# Patient Record
Sex: Female | Born: 1945 | Race: White | Hispanic: No | Marital: Married | Smoking: Never smoker
Health system: Southern US, Community
[De-identification: ages and names within clinical notes are randomized; demographics above are authoritative.]

## PROBLEM LIST (undated history)

## (undated) DIAGNOSIS — I447 Left bundle-branch block, unspecified: Secondary | ICD-10-CM

## (undated) DIAGNOSIS — E785 Hyperlipidemia, unspecified: Secondary | ICD-10-CM

## (undated) DIAGNOSIS — R740 Nonspecific elevation of levels of transaminase and lactic acid dehydrogenase [LDH]: Secondary | ICD-10-CM

## (undated) DIAGNOSIS — R7401 Elevation of levels of liver transaminase levels: Secondary | ICD-10-CM

## (undated) DIAGNOSIS — Z9889 Other specified postprocedural states: Secondary | ICD-10-CM

## (undated) DIAGNOSIS — I10 Essential (primary) hypertension: Secondary | ICD-10-CM

## (undated) DIAGNOSIS — C8590 Non-Hodgkin lymphoma, unspecified, unspecified site: Secondary | ICD-10-CM

## (undated) DIAGNOSIS — E039 Hypothyroidism, unspecified: Secondary | ICD-10-CM

## (undated) DIAGNOSIS — I5032 Chronic diastolic (congestive) heart failure: Secondary | ICD-10-CM

## (undated) HISTORY — DX: Hypothyroidism, unspecified: E03.9

## (undated) HISTORY — DX: Hyperlipidemia, unspecified: E78.5

## (undated) HISTORY — DX: Elevation of levels of liver transaminase levels: R74.01

## (undated) HISTORY — DX: Nonspecific elevation of levels of transaminase and lactic acid dehydrogenase (ldh): R74.0

## (undated) HISTORY — DX: Left bundle-branch block, unspecified: I44.7

## (undated) HISTORY — DX: Non-Hodgkin lymphoma, unspecified, unspecified site: C85.90

## (undated) HISTORY — DX: Other specified postprocedural states: Z98.890

## (undated) HISTORY — PX: FOOT FRACTURE SURGERY: SHX645

---

## 1981-06-09 HISTORY — PX: ABDOMINAL HYSTERECTOMY: SUR658

## 1997-09-22 ENCOUNTER — Other Ambulatory Visit: Admission: RE | Admit: 1997-09-22 | Discharge: 1997-09-22 | Payer: Self-pay | Admitting: Obstetrics & Gynecology

## 1998-12-19 ENCOUNTER — Encounter: Payer: Self-pay | Admitting: Orthopedic Surgery

## 1998-12-19 ENCOUNTER — Ambulatory Visit (HOSPITAL_COMMUNITY): Admission: RE | Admit: 1998-12-19 | Discharge: 1998-12-19 | Payer: Self-pay | Admitting: Orthopedic Surgery

## 1998-12-28 ENCOUNTER — Inpatient Hospital Stay (HOSPITAL_COMMUNITY): Admission: RE | Admit: 1998-12-28 | Discharge: 1998-12-29 | Payer: Self-pay | Admitting: Orthopedic Surgery

## 1998-12-28 ENCOUNTER — Encounter: Payer: Self-pay | Admitting: Orthopedic Surgery

## 2000-12-17 ENCOUNTER — Ambulatory Visit (HOSPITAL_COMMUNITY): Admission: RE | Admit: 2000-12-17 | Discharge: 2000-12-17 | Payer: Self-pay | Admitting: Oncology

## 2000-12-17 ENCOUNTER — Encounter (HOSPITAL_COMMUNITY): Payer: Self-pay | Admitting: Oncology

## 2001-12-17 ENCOUNTER — Ambulatory Visit (HOSPITAL_COMMUNITY): Admission: RE | Admit: 2001-12-17 | Discharge: 2001-12-17 | Payer: Self-pay | Admitting: Oncology

## 2001-12-17 ENCOUNTER — Encounter (HOSPITAL_COMMUNITY): Payer: Self-pay | Admitting: Oncology

## 2003-01-05 ENCOUNTER — Ambulatory Visit (HOSPITAL_COMMUNITY): Admission: RE | Admit: 2003-01-05 | Discharge: 2003-01-05 | Payer: Self-pay | Admitting: Oncology

## 2003-01-05 ENCOUNTER — Encounter (HOSPITAL_COMMUNITY): Payer: Self-pay | Admitting: Oncology

## 2005-01-02 ENCOUNTER — Ambulatory Visit: Payer: Self-pay | Admitting: Oncology

## 2005-01-03 ENCOUNTER — Ambulatory Visit (HOSPITAL_COMMUNITY): Admission: RE | Admit: 2005-01-03 | Discharge: 2005-01-03 | Payer: Self-pay | Admitting: Oncology

## 2005-11-14 ENCOUNTER — Inpatient Hospital Stay (HOSPITAL_COMMUNITY): Admission: EM | Admit: 2005-11-14 | Discharge: 2005-11-16 | Payer: Self-pay | Admitting: Emergency Medicine

## 2005-12-31 ENCOUNTER — Ambulatory Visit: Payer: Self-pay | Admitting: Oncology

## 2006-01-05 LAB — CBC WITH DIFFERENTIAL/PLATELET
BASO%: 0.4 % (ref 0.0–2.0)
Basophils Absolute: 0 10*3/uL (ref 0.0–0.1)
EOS%: 0.9 % (ref 0.0–7.0)
HGB: 13.4 g/dL (ref 11.6–15.9)
MCH: 30.6 pg (ref 26.0–34.0)
MCHC: 34.4 g/dL (ref 32.0–36.0)
RDW: 13.1 % (ref 11.3–14.5)
WBC: 9.1 10*3/uL (ref 3.9–10.0)
lymph#: 2.6 10*3/uL (ref 0.9–3.3)

## 2006-01-05 LAB — COMPREHENSIVE METABOLIC PANEL
ALT: 48 U/L — ABNORMAL HIGH (ref 0–40)
Albumin: 4.4 g/dL (ref 3.5–5.2)
CO2: 25 mEq/L (ref 19–32)
Glucose, Bld: 86 mg/dL (ref 70–99)
Potassium: 4.4 mEq/L (ref 3.5–5.3)
Sodium: 139 mEq/L (ref 135–145)
Total Protein: 7.9 g/dL (ref 6.0–8.3)

## 2006-01-05 LAB — LACTATE DEHYDROGENASE: LDH: 159 U/L (ref 94–250)

## 2006-01-05 LAB — ERYTHROCYTE SEDIMENTATION RATE: Sed Rate: 25 mm/hr (ref 0–30)

## 2006-12-31 ENCOUNTER — Ambulatory Visit: Payer: Self-pay | Admitting: Oncology

## 2007-01-04 ENCOUNTER — Ambulatory Visit (HOSPITAL_COMMUNITY): Admission: RE | Admit: 2007-01-04 | Discharge: 2007-01-04 | Payer: Self-pay | Admitting: Oncology

## 2007-01-04 LAB — CBC WITH DIFFERENTIAL/PLATELET
BASO%: 0.6 % (ref 0.0–2.0)
EOS%: 2.2 % (ref 0.0–7.0)
LYMPH%: 35.3 % (ref 14.0–48.0)
MCH: 31.5 pg (ref 26.0–34.0)
MCHC: 35.8 g/dL (ref 32.0–36.0)
MONO#: 0.5 10*3/uL (ref 0.1–0.9)
NEUT%: 56.3 % (ref 39.6–76.8)
Platelets: 312 10*3/uL (ref 145–400)
RBC: 4.34 10*6/uL (ref 3.70–5.32)
WBC: 8.8 10*3/uL (ref 3.9–10.0)
lymph#: 3.1 10*3/uL (ref 0.9–3.3)

## 2007-01-04 LAB — COMPREHENSIVE METABOLIC PANEL
ALT: 37 U/L — ABNORMAL HIGH (ref 0–35)
AST: 27 U/L (ref 0–37)
CO2: 23 mEq/L (ref 19–32)
Creatinine, Ser: 0.73 mg/dL (ref 0.40–1.20)
Sodium: 139 mEq/L (ref 135–145)
Total Bilirubin: 0.6 mg/dL (ref 0.3–1.2)
Total Protein: 6.9 g/dL (ref 6.0–8.3)

## 2007-01-04 LAB — LACTATE DEHYDROGENASE: LDH: 141 U/L (ref 94–250)

## 2007-04-14 IMAGING — CR DG CHEST 2V
2 series · 2 of 2 positions shown · non-contrast
Comparison: Report dated 01/05/2003.

CLINICAL DATA: Hodgkin's lymphoma.

CHEST - 2 VIEW

[view not recorded (1 of 2)]
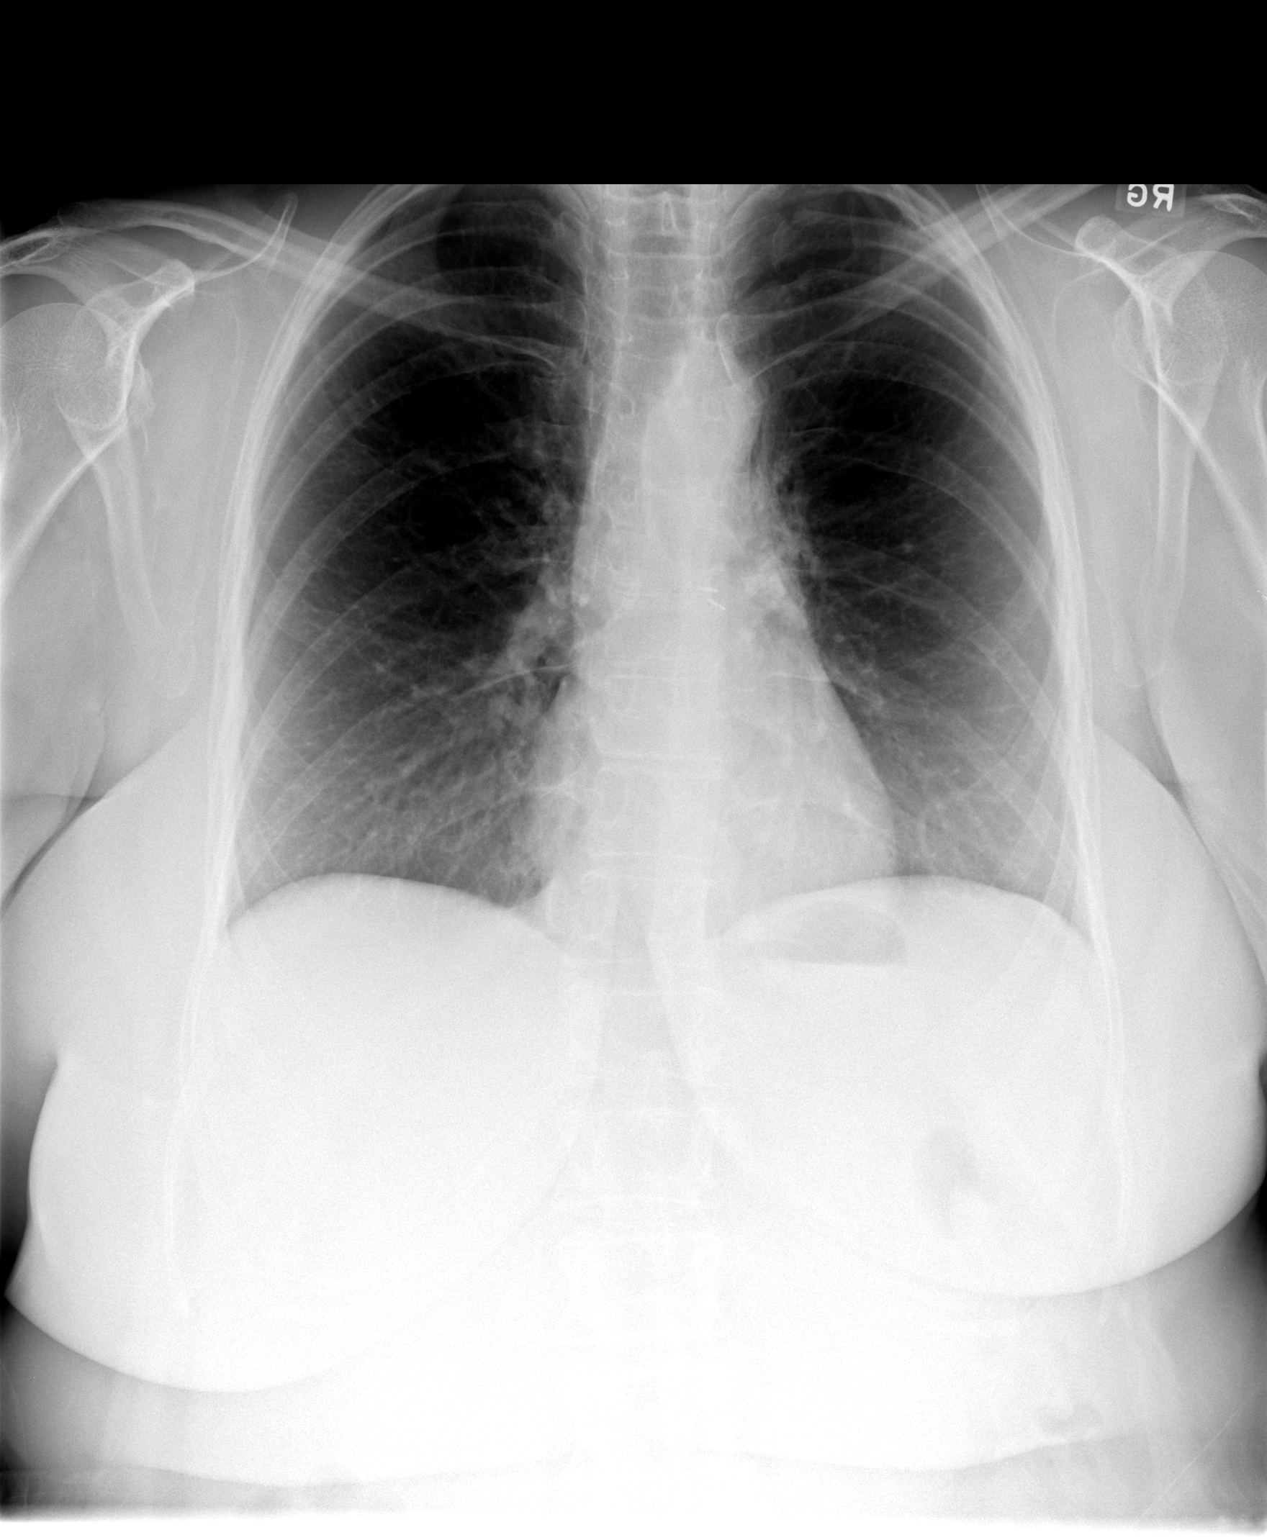

[view not recorded (2 of 2)]
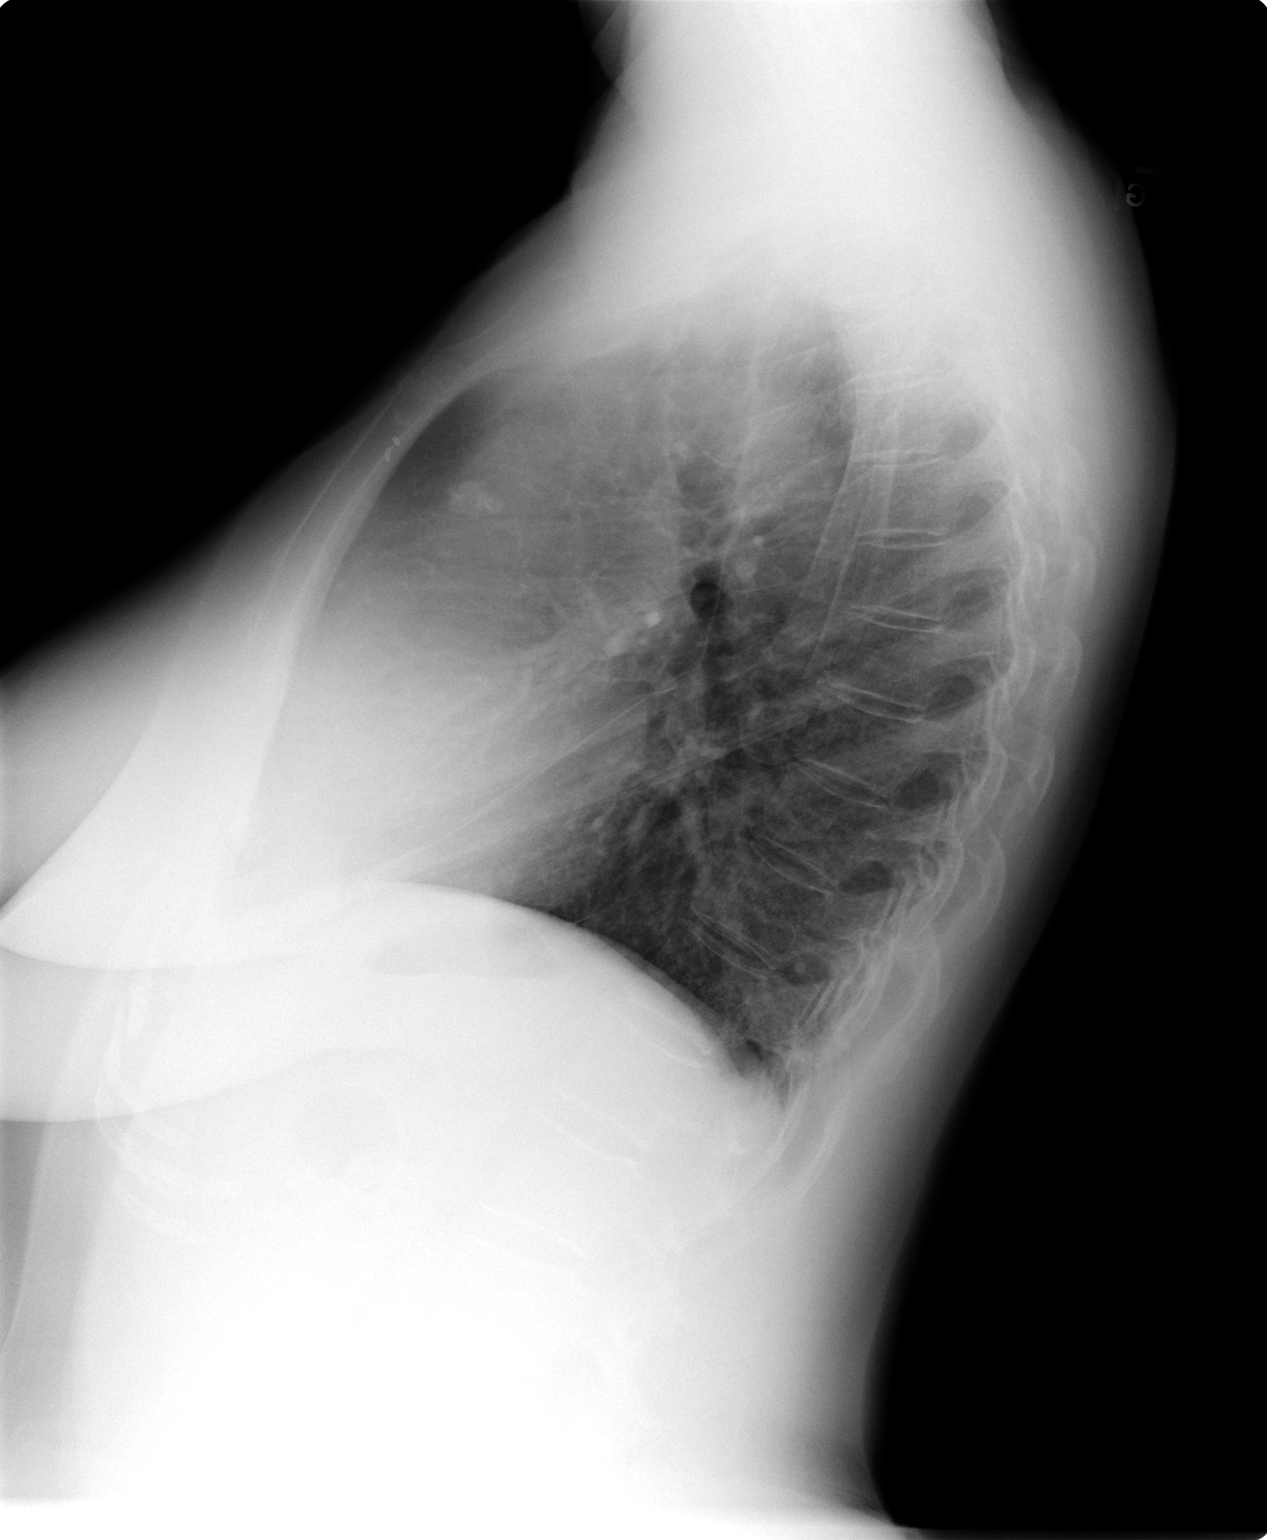

[2 of 2 positions shown; findings below may reference images not displayed]

FINDINGS: Normal sized heart. Minimal diffuse peribronchial thickening and
accentuation of the interstitial markings. Left anterior mediastinal calcified
lymph node and small calcified right hilar lymph nodes. Two surgical clips in
the region of the sternum on the left. Mild scoliosis.

IMPRESSION

1. Minimal chronic bronchitic changes and chronic interstitial lung disease.
2. Previous granulomatous infection.
3. No visibly enlarged lymph nodes.

## 2007-12-31 ENCOUNTER — Ambulatory Visit: Payer: Self-pay | Admitting: Oncology

## 2008-01-04 LAB — CBC WITH DIFFERENTIAL/PLATELET
BASO%: 0.6 % (ref 0.0–2.0)
Basophils Absolute: 0 10*3/uL (ref 0.0–0.1)
Eosinophils Absolute: 0.3 10*3/uL (ref 0.0–0.5)
HCT: 38.4 % (ref 34.8–46.6)
HGB: 13.5 g/dL (ref 11.6–15.9)
MONO#: 0.6 10*3/uL (ref 0.1–0.9)
NEUT#: 3.9 10*3/uL (ref 1.5–6.5)
NEUT%: 49.2 % (ref 39.6–76.8)
WBC: 7.9 10*3/uL (ref 3.9–10.0)
lymph#: 3.1 10*3/uL (ref 0.9–3.3)

## 2008-01-04 LAB — COMPREHENSIVE METABOLIC PANEL
ALT: 39 U/L — ABNORMAL HIGH (ref 0–35)
CO2: 23 mEq/L (ref 19–32)
Calcium: 9.7 mg/dL (ref 8.4–10.5)
Chloride: 106 mEq/L (ref 96–112)
Creatinine, Ser: 0.76 mg/dL (ref 0.40–1.20)
Glucose, Bld: 102 mg/dL — ABNORMAL HIGH (ref 70–99)

## 2008-01-04 LAB — LACTATE DEHYDROGENASE: LDH: 153 U/L (ref 94–250)

## 2008-02-23 IMAGING — CR DG TIBIA/FIBULA 2V*L*
3 series · 3 of 3 positions shown · non-contrast
Comparison: none

CLINICAL DATA: Fell

[view not recorded (1 of 3)]
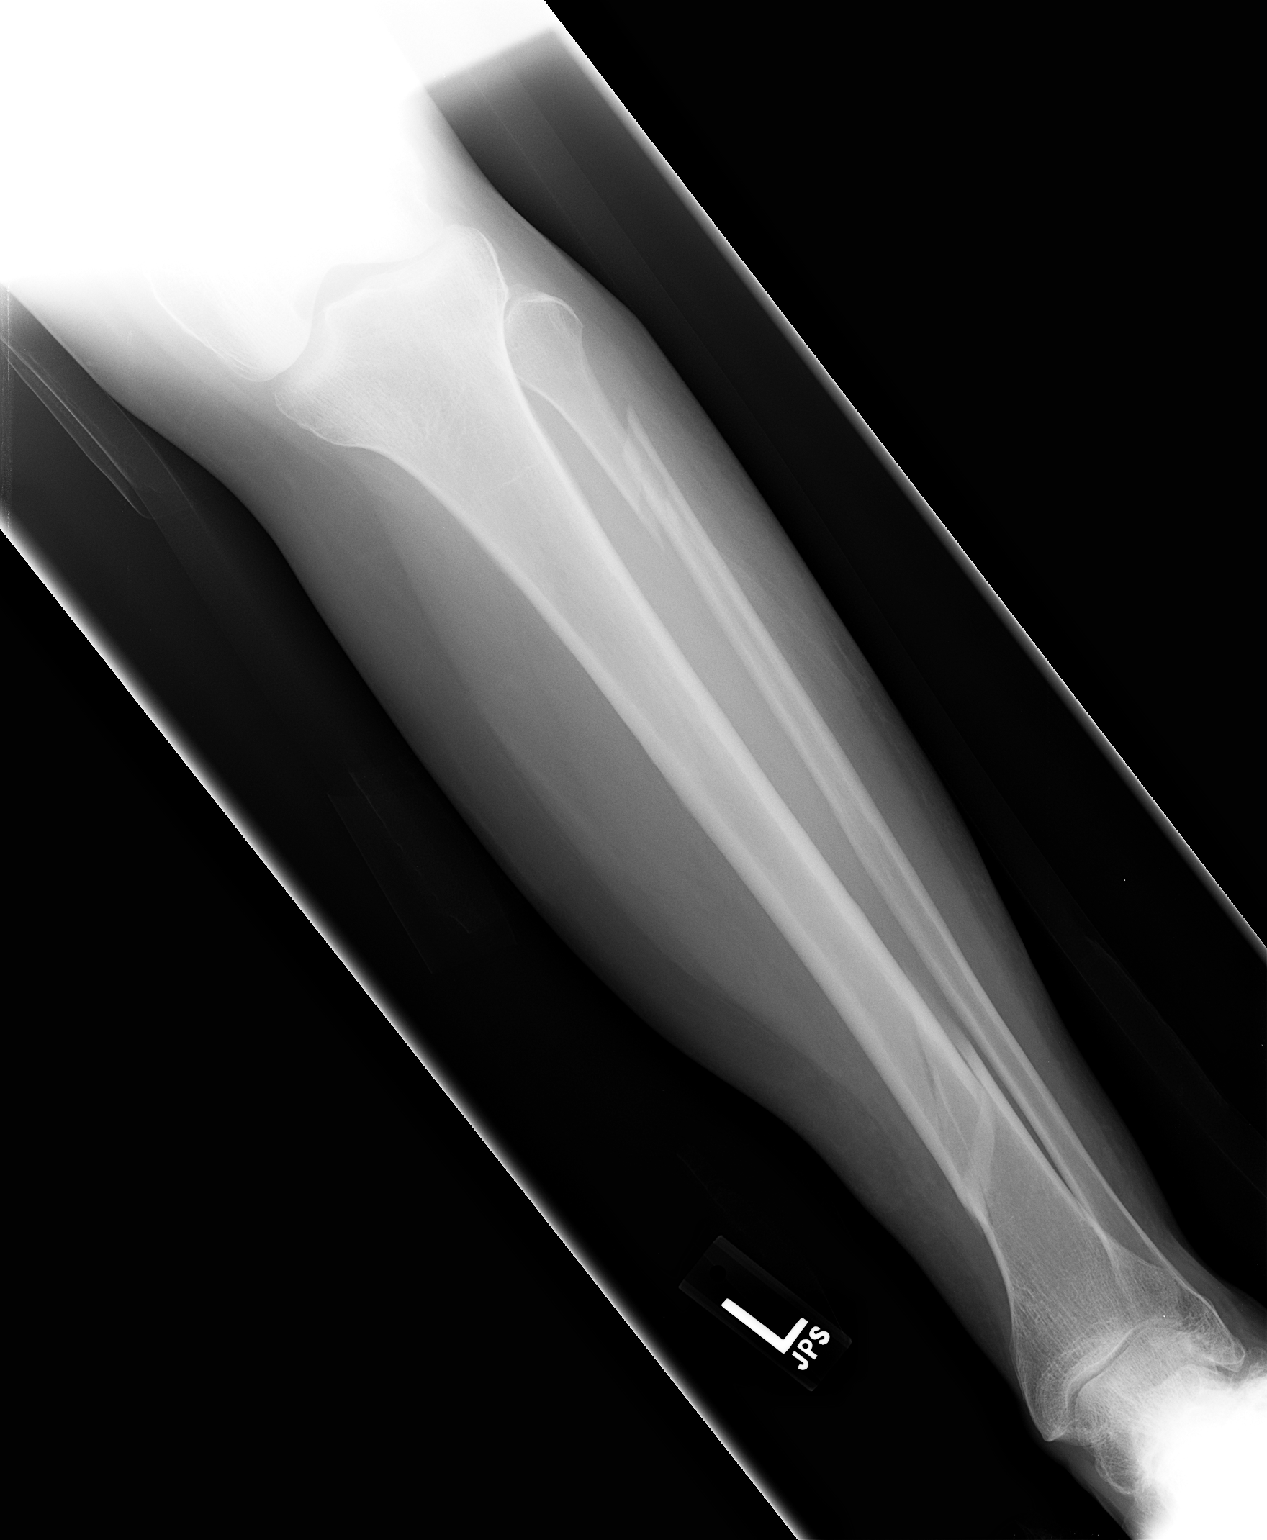

[view not recorded (2 of 3)]
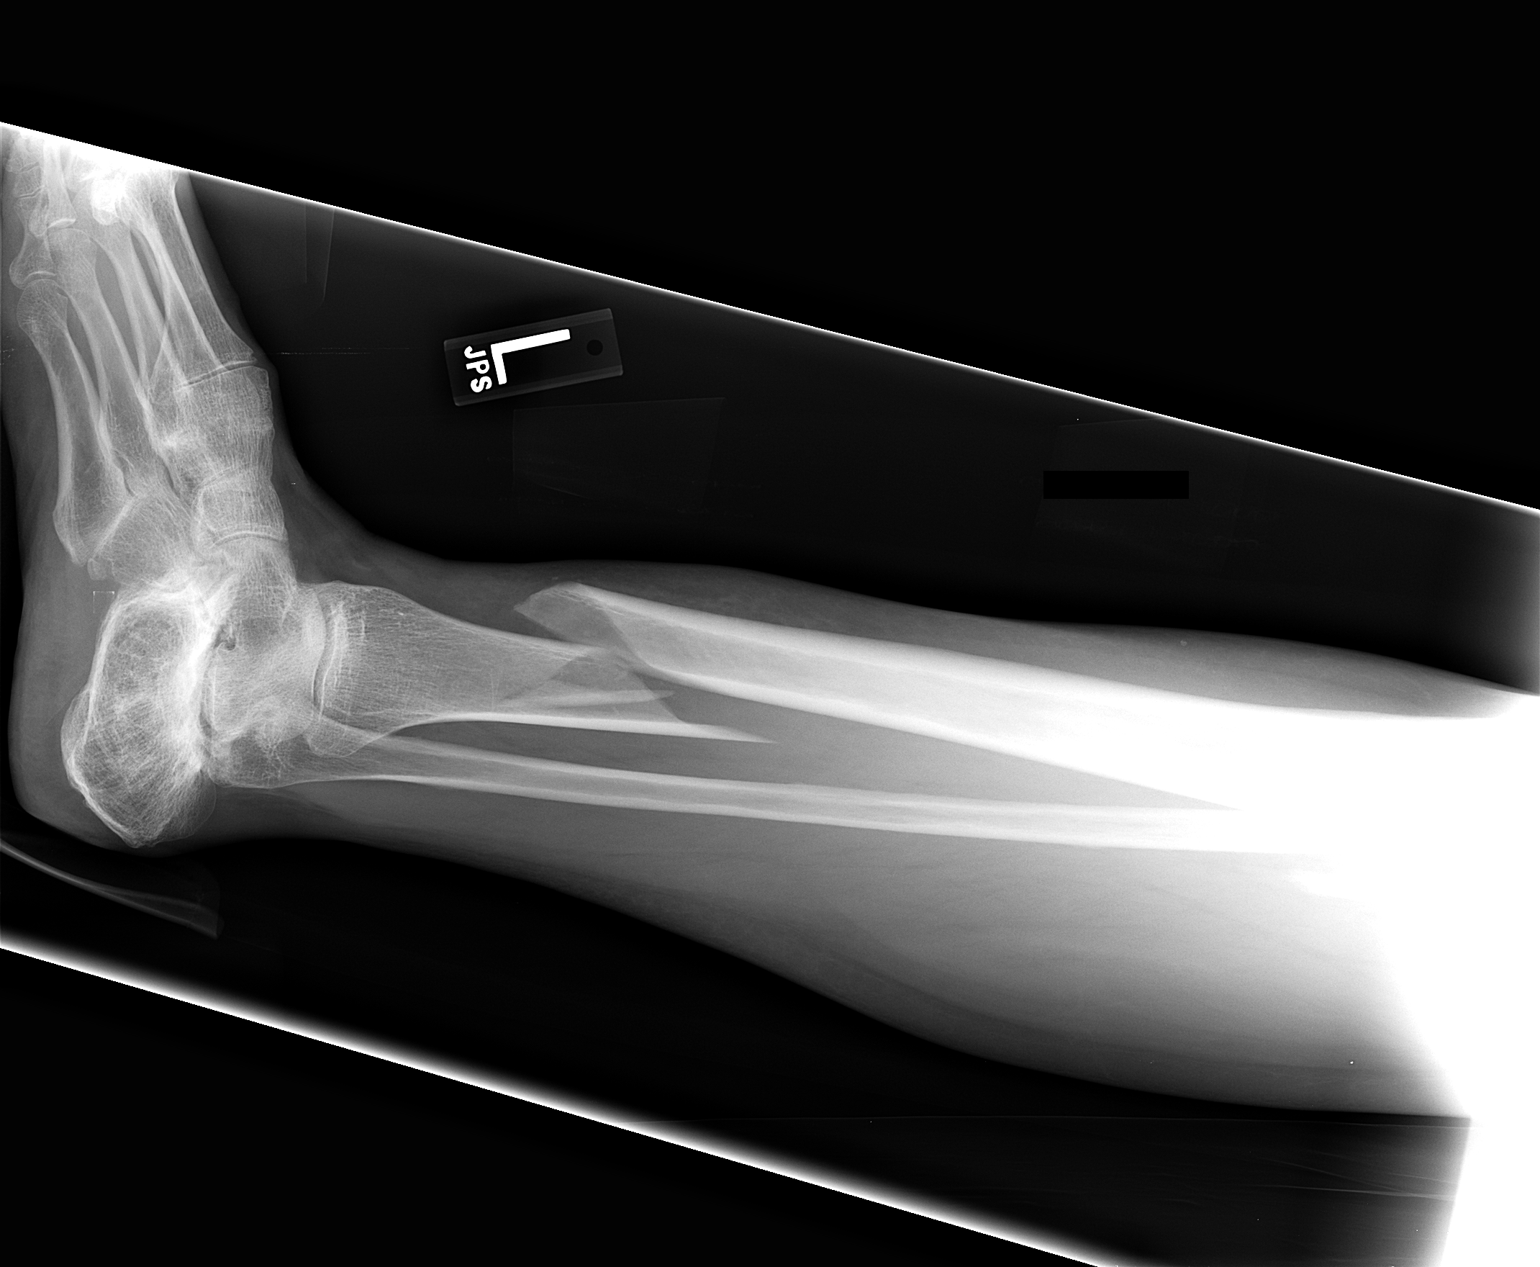

[view not recorded (3 of 3)]
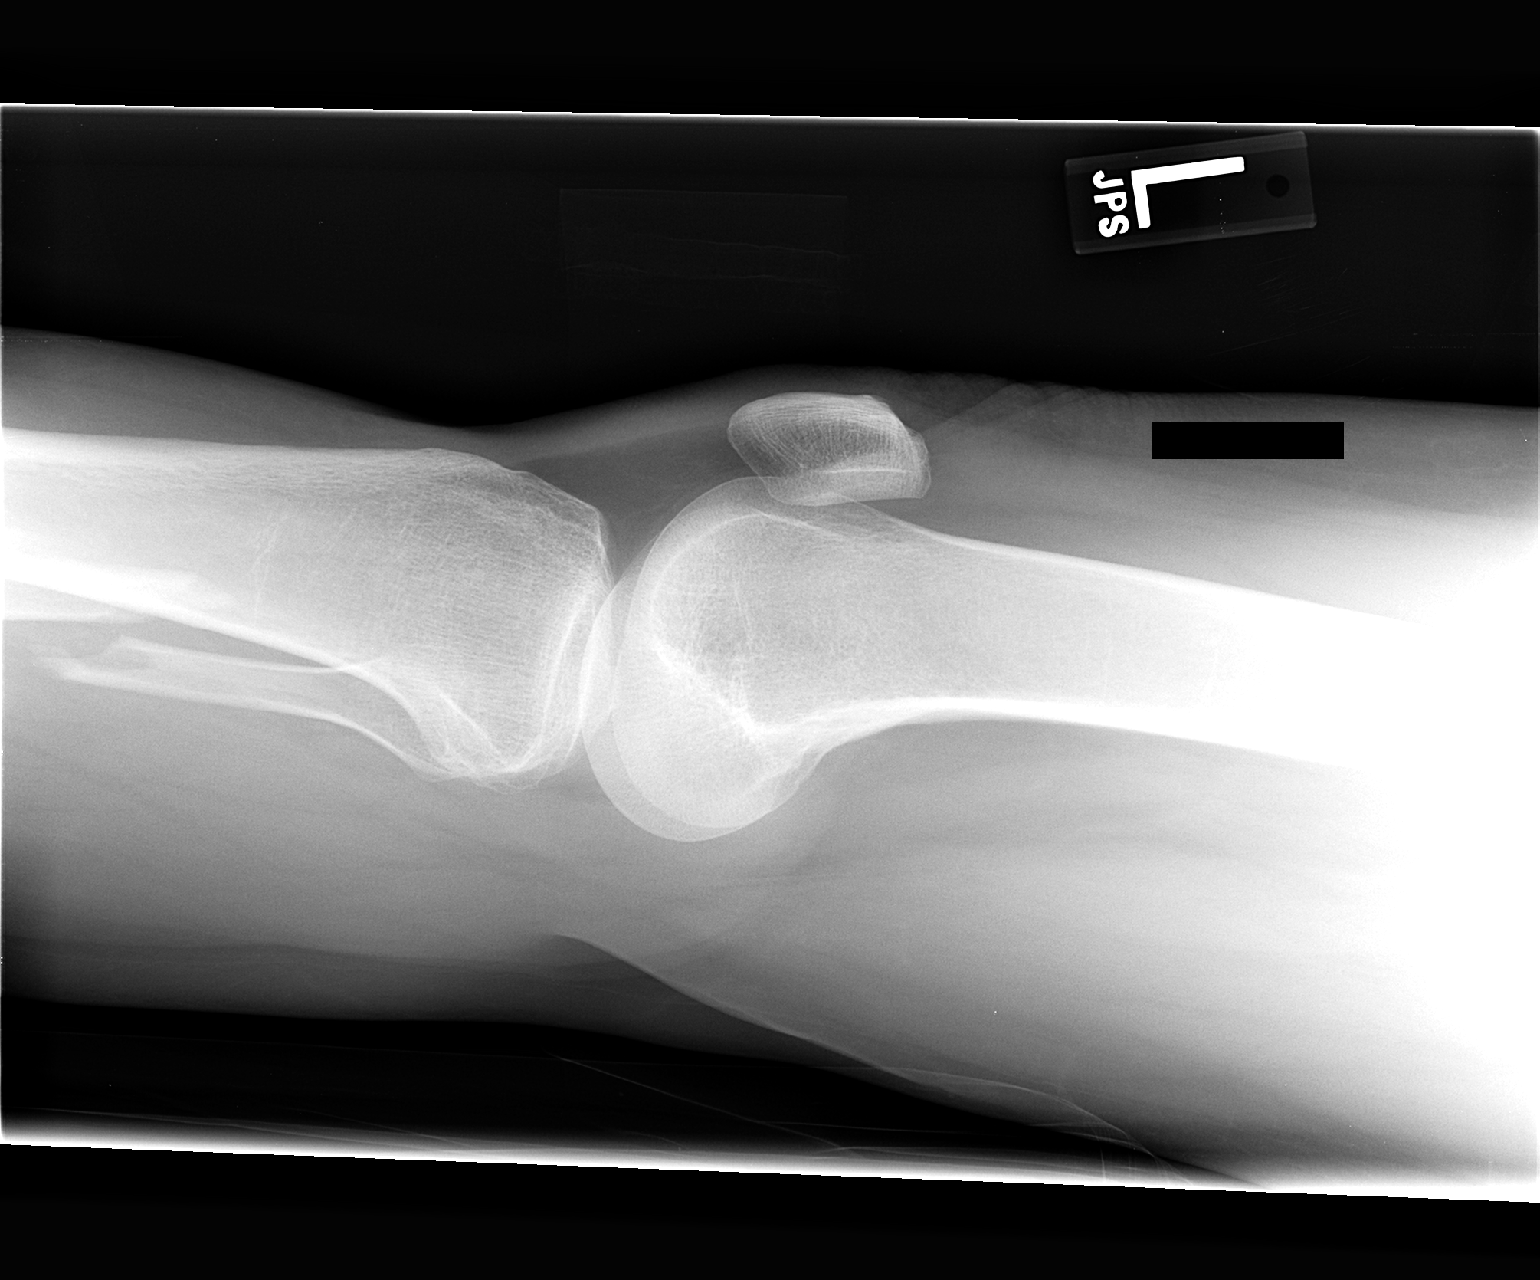

[3 of 3 positions shown; findings below may reference images not displayed]

Left tibia-fibula 2 views:

Oblique fracture of the proximal fibular diaphysis with shaft width lateral and
anterior displacement of this distal shaft fragment. Spiral   fracture of the
distal left tibial diaphysis with shaft width posterior displacement of the
distal fracture fragment. Chronic appearing deformity and sclerosis in the
calcaneus.
IMPRESSION: 1. Displaced proximal fibular and distal tibial shaft fractures.

## 2008-02-23 IMAGING — CR DG CHEST 1V PORT
1 series · 1 of 1 positions shown · non-contrast
Comparison: 01/03/05.
PORTABLE CHEST - 1 VIEW:

CLINICAL DATA: Status post fall.

[view not recorded]
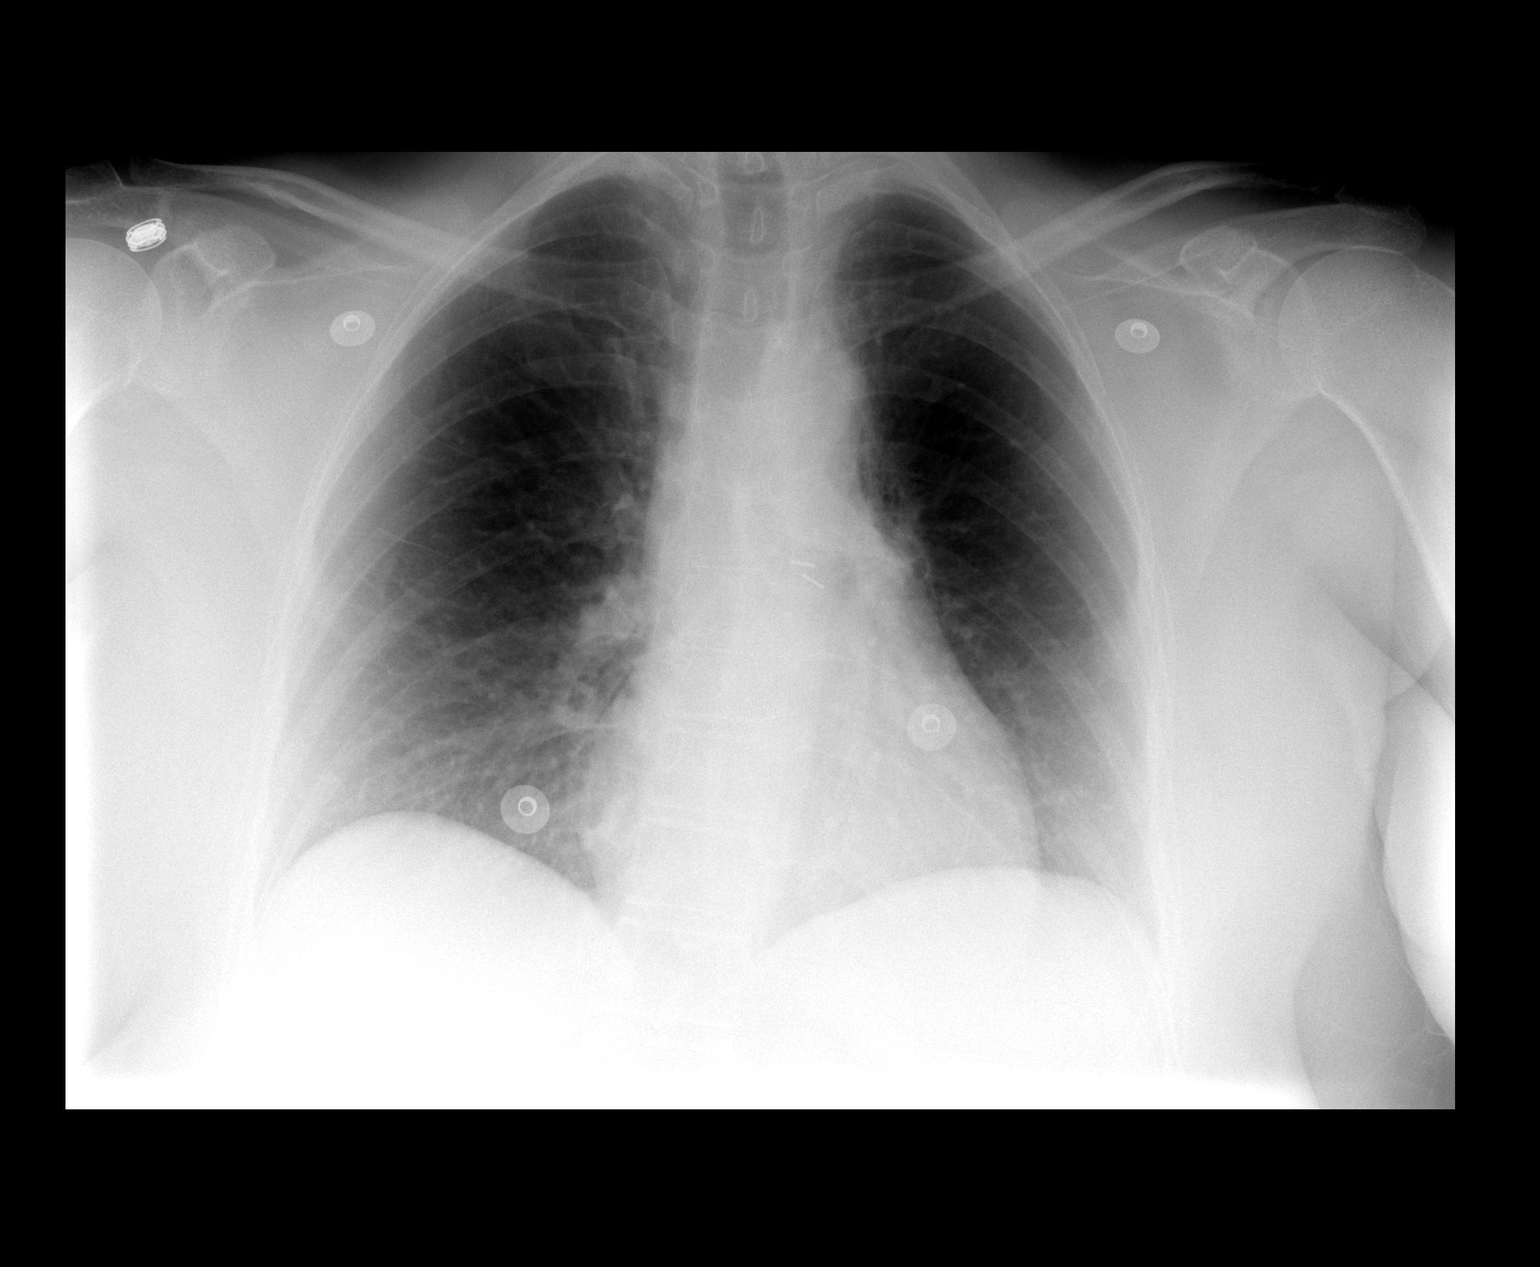

[1 of 1 positions shown; findings below may reference images not displayed]

FINDINGS: Heart size is normal.  There are no effusions or edema.  There are minimal chronic bronchitic changes identified, unchanged from prior exam.  Calcified hilar lymph nodes, consistent with prior granulomatous inflammation.
IMPRESSION: 1.  No active cardiopulmonary disease.  
2.  Chronic bronchitic changes.

## 2008-02-23 IMAGING — CR DG TIBIA/FIBULA PORT 2V*L*
2 series · 2 of 2 positions shown · non-contrast
Comparison: 11/14/05.

CLINICAL DATA: Status post ORIF of tibia fracture.
 LEFT TIBIA AND FIBULA ? 2 VIEWS ? 11/14/05:

[view not recorded (1 of 2)]
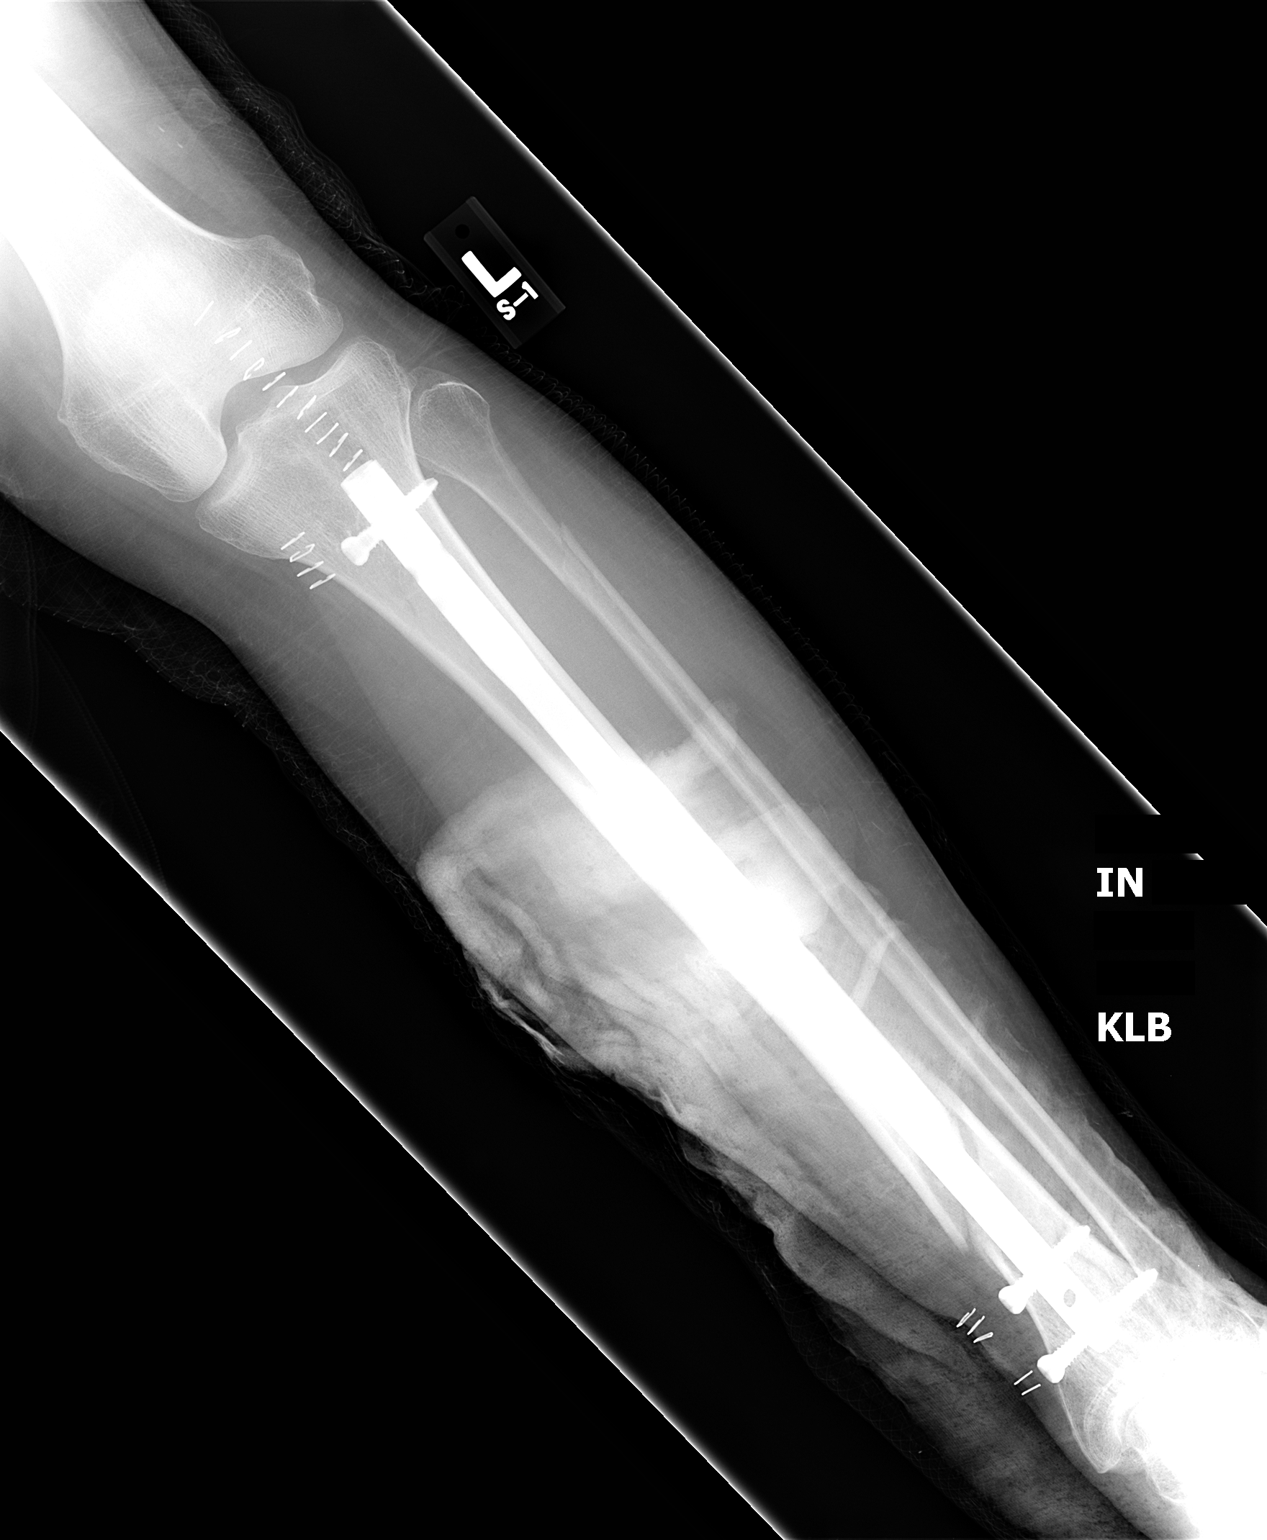

[view not recorded (2 of 2)]
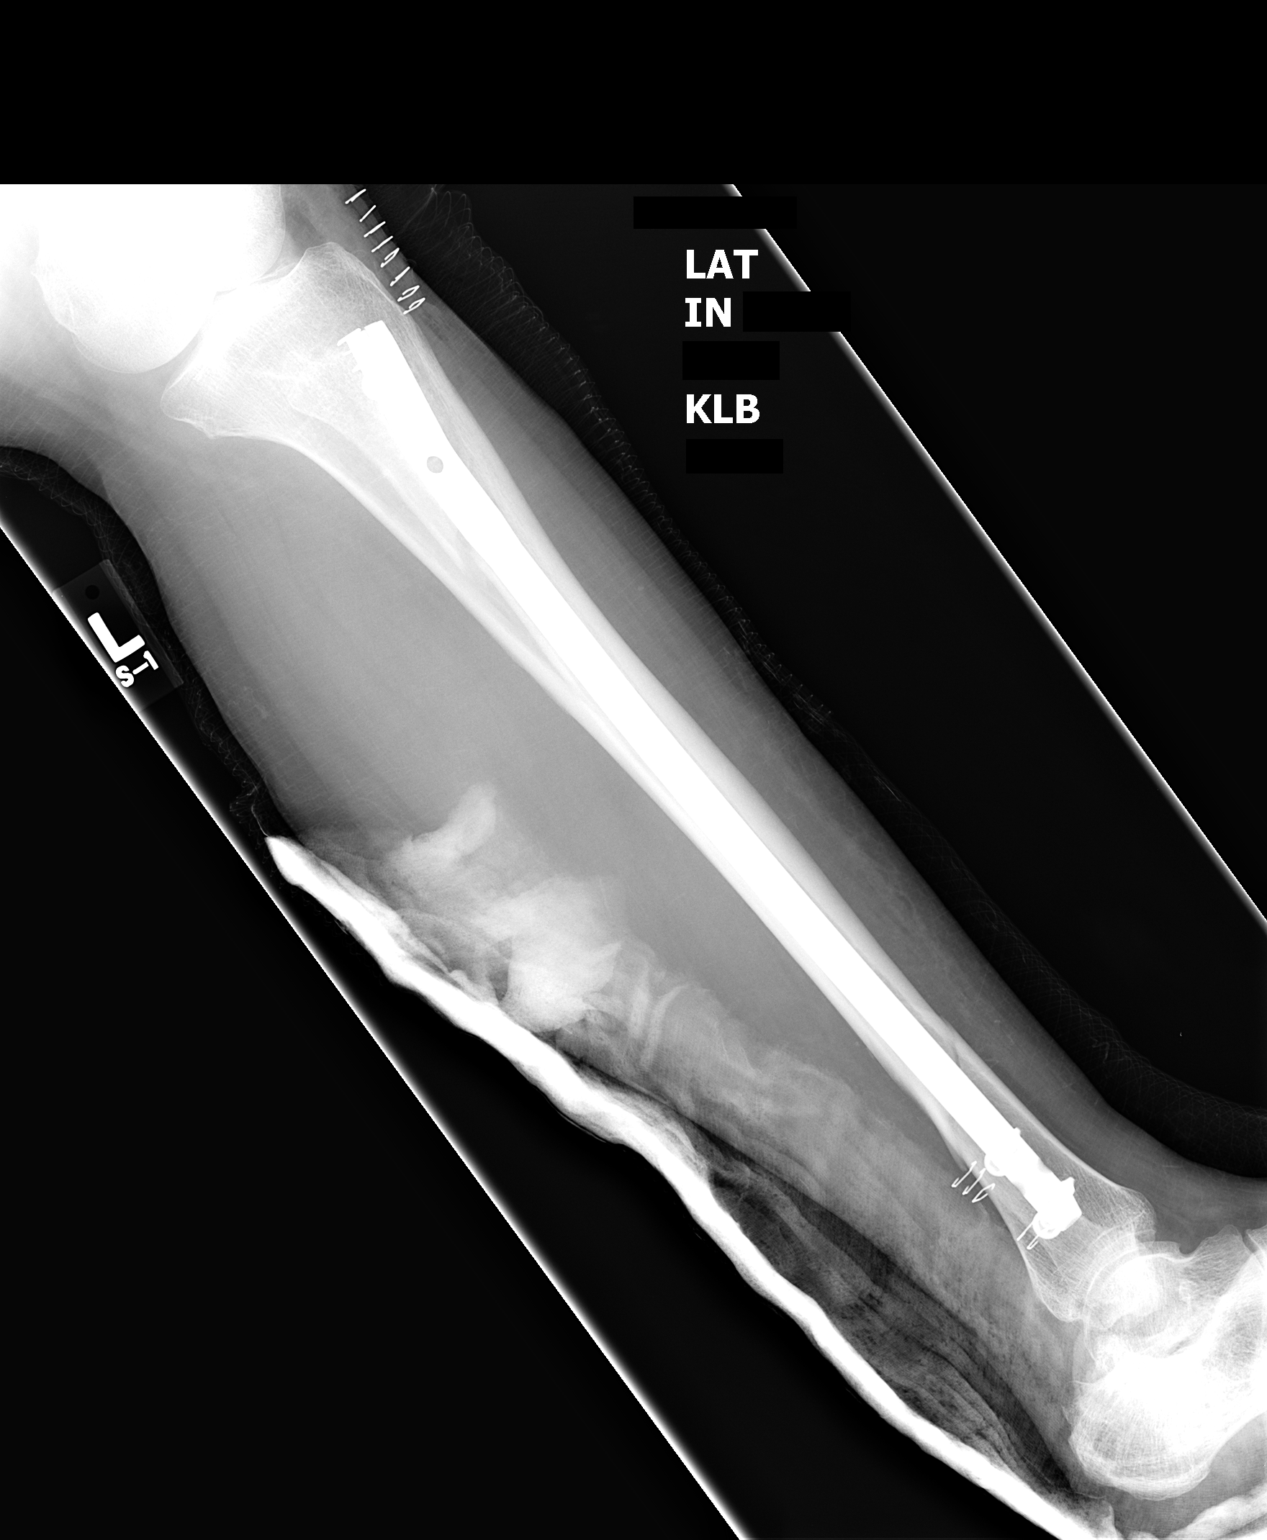

[2 of 2 positions shown; findings below may reference images not displayed]

FINDINGS: A minimally displaced proximal fibular fracture is again noted and has been reduced in the interval.
 An intramedullary rod and screw device reduces a comminuted distal tibial fracture.  The fracture fragments are now in anatomic alignment.
IMPRESSION: Status post ORIF of distal tibial fracture.

## 2008-03-27 ENCOUNTER — Encounter (INDEPENDENT_AMBULATORY_CARE_PROVIDER_SITE_OTHER): Payer: Self-pay | Admitting: *Deleted

## 2008-03-27 ENCOUNTER — Ambulatory Visit (HOSPITAL_COMMUNITY): Admission: RE | Admit: 2008-03-27 | Discharge: 2008-03-27 | Payer: Self-pay | Admitting: *Deleted

## 2009-01-01 ENCOUNTER — Ambulatory Visit: Payer: Self-pay | Admitting: Oncology

## 2009-01-04 LAB — CBC WITH DIFFERENTIAL/PLATELET
EOS%: 2 % (ref 0.0–7.0)
LYMPH%: 33.5 % (ref 14.0–49.7)
MCH: 31 pg (ref 25.1–34.0)
MCV: 90 fL (ref 79.5–101.0)
MONO%: 5.8 % (ref 0.0–14.0)
Platelets: 294 10*3/uL (ref 145–400)
RBC: 4.33 10*6/uL (ref 3.70–5.45)
RDW: 12.6 % (ref 11.2–14.5)

## 2009-01-04 LAB — COMPREHENSIVE METABOLIC PANEL
AST: 34 U/L (ref 0–37)
Albumin: 4.1 g/dL (ref 3.5–5.2)
Alkaline Phosphatase: 84 U/L (ref 39–117)
BUN: 18 mg/dL (ref 6–23)
Potassium: 4.2 mEq/L (ref 3.5–5.3)
Sodium: 139 mEq/L (ref 135–145)
Total Bilirubin: 0.4 mg/dL (ref 0.3–1.2)

## 2009-01-04 LAB — TSH: TSH: 1.716 u[IU]/mL (ref 0.350–4.500)

## 2009-04-14 IMAGING — CR DG CHEST 2V
2 series · 2 of 2 positions shown · non-contrast
Comparison: 11/14/2005

CLINICAL DATA: History of Hodgkin?s disease. 
 CHEST - 2 VIEW:

[w chest pa]
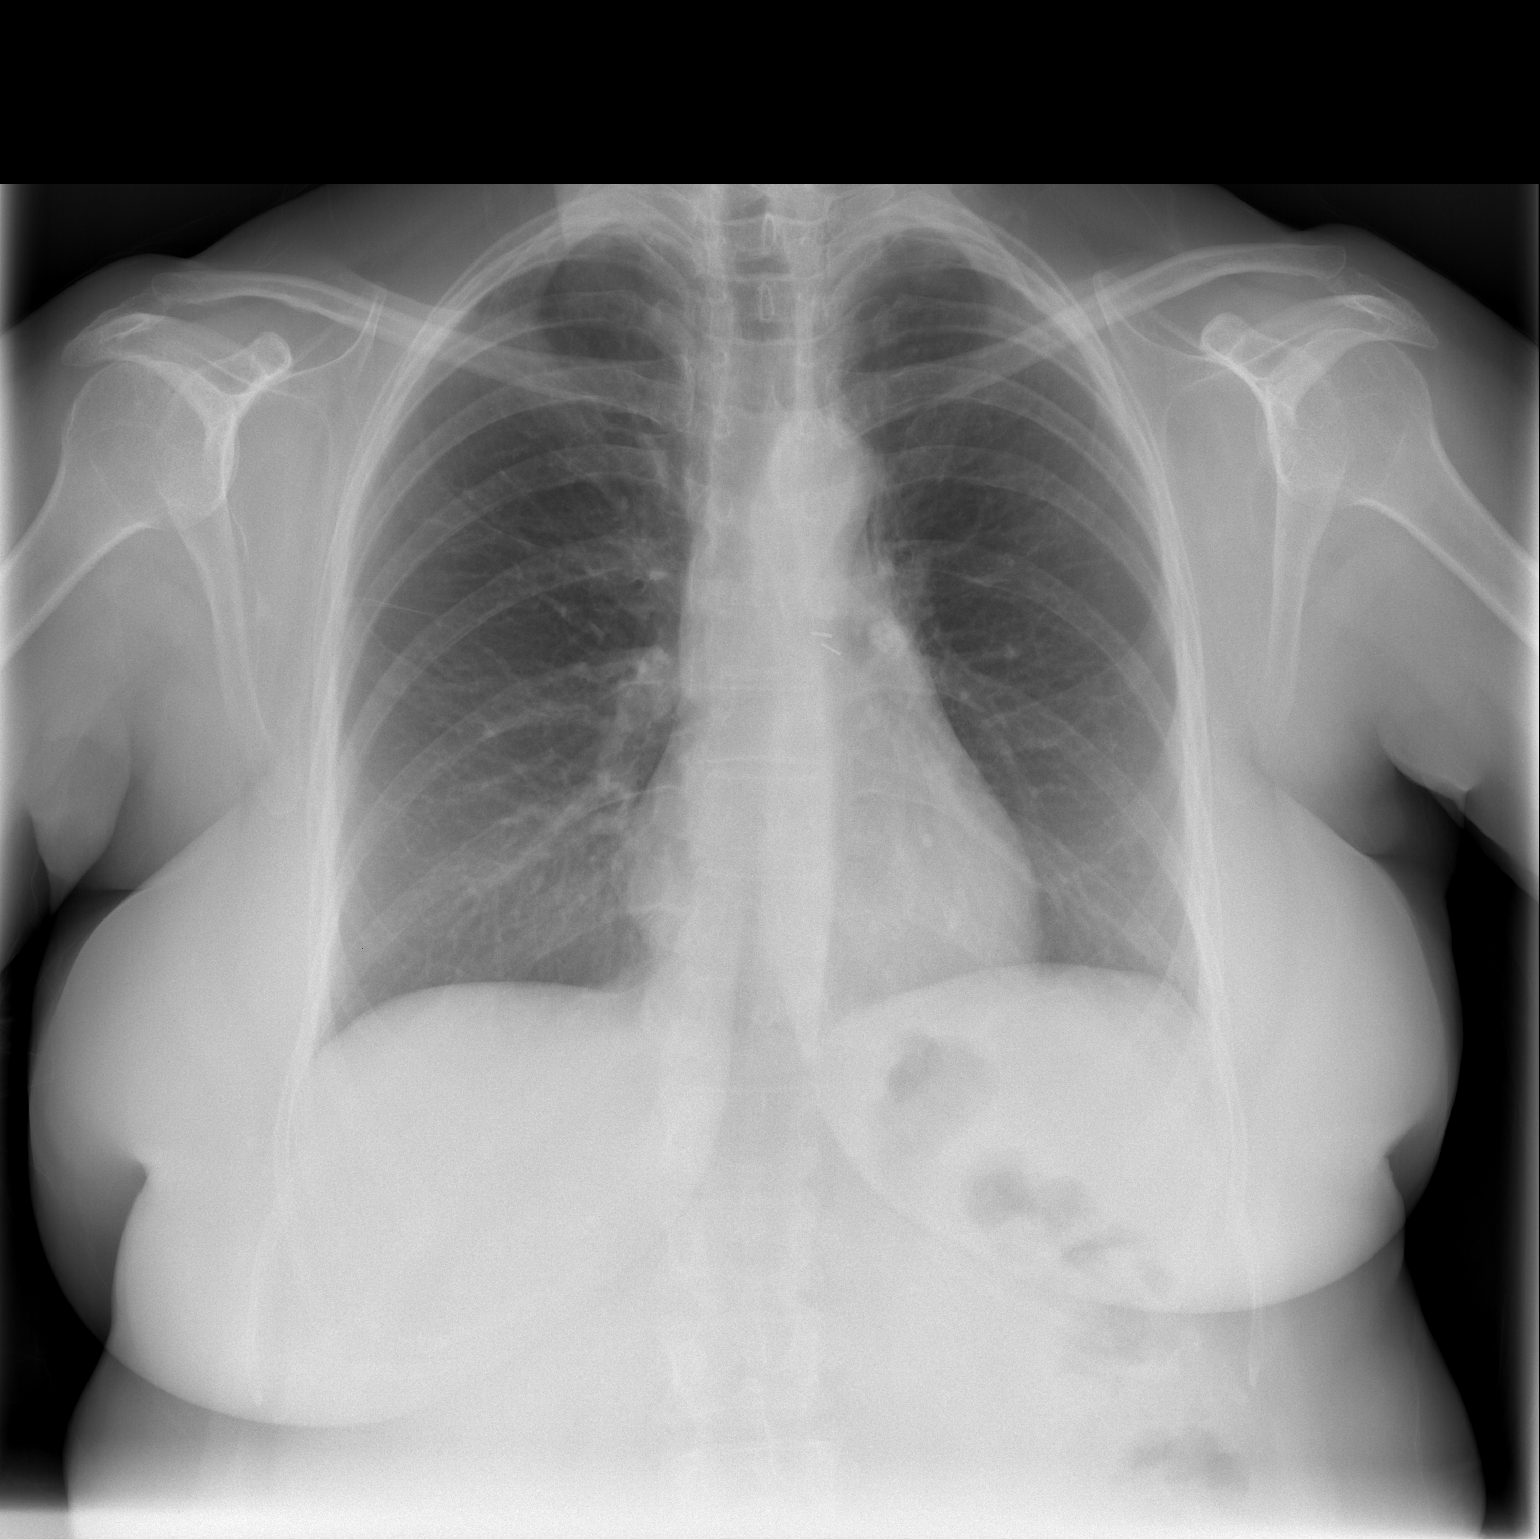

[w chest lat]
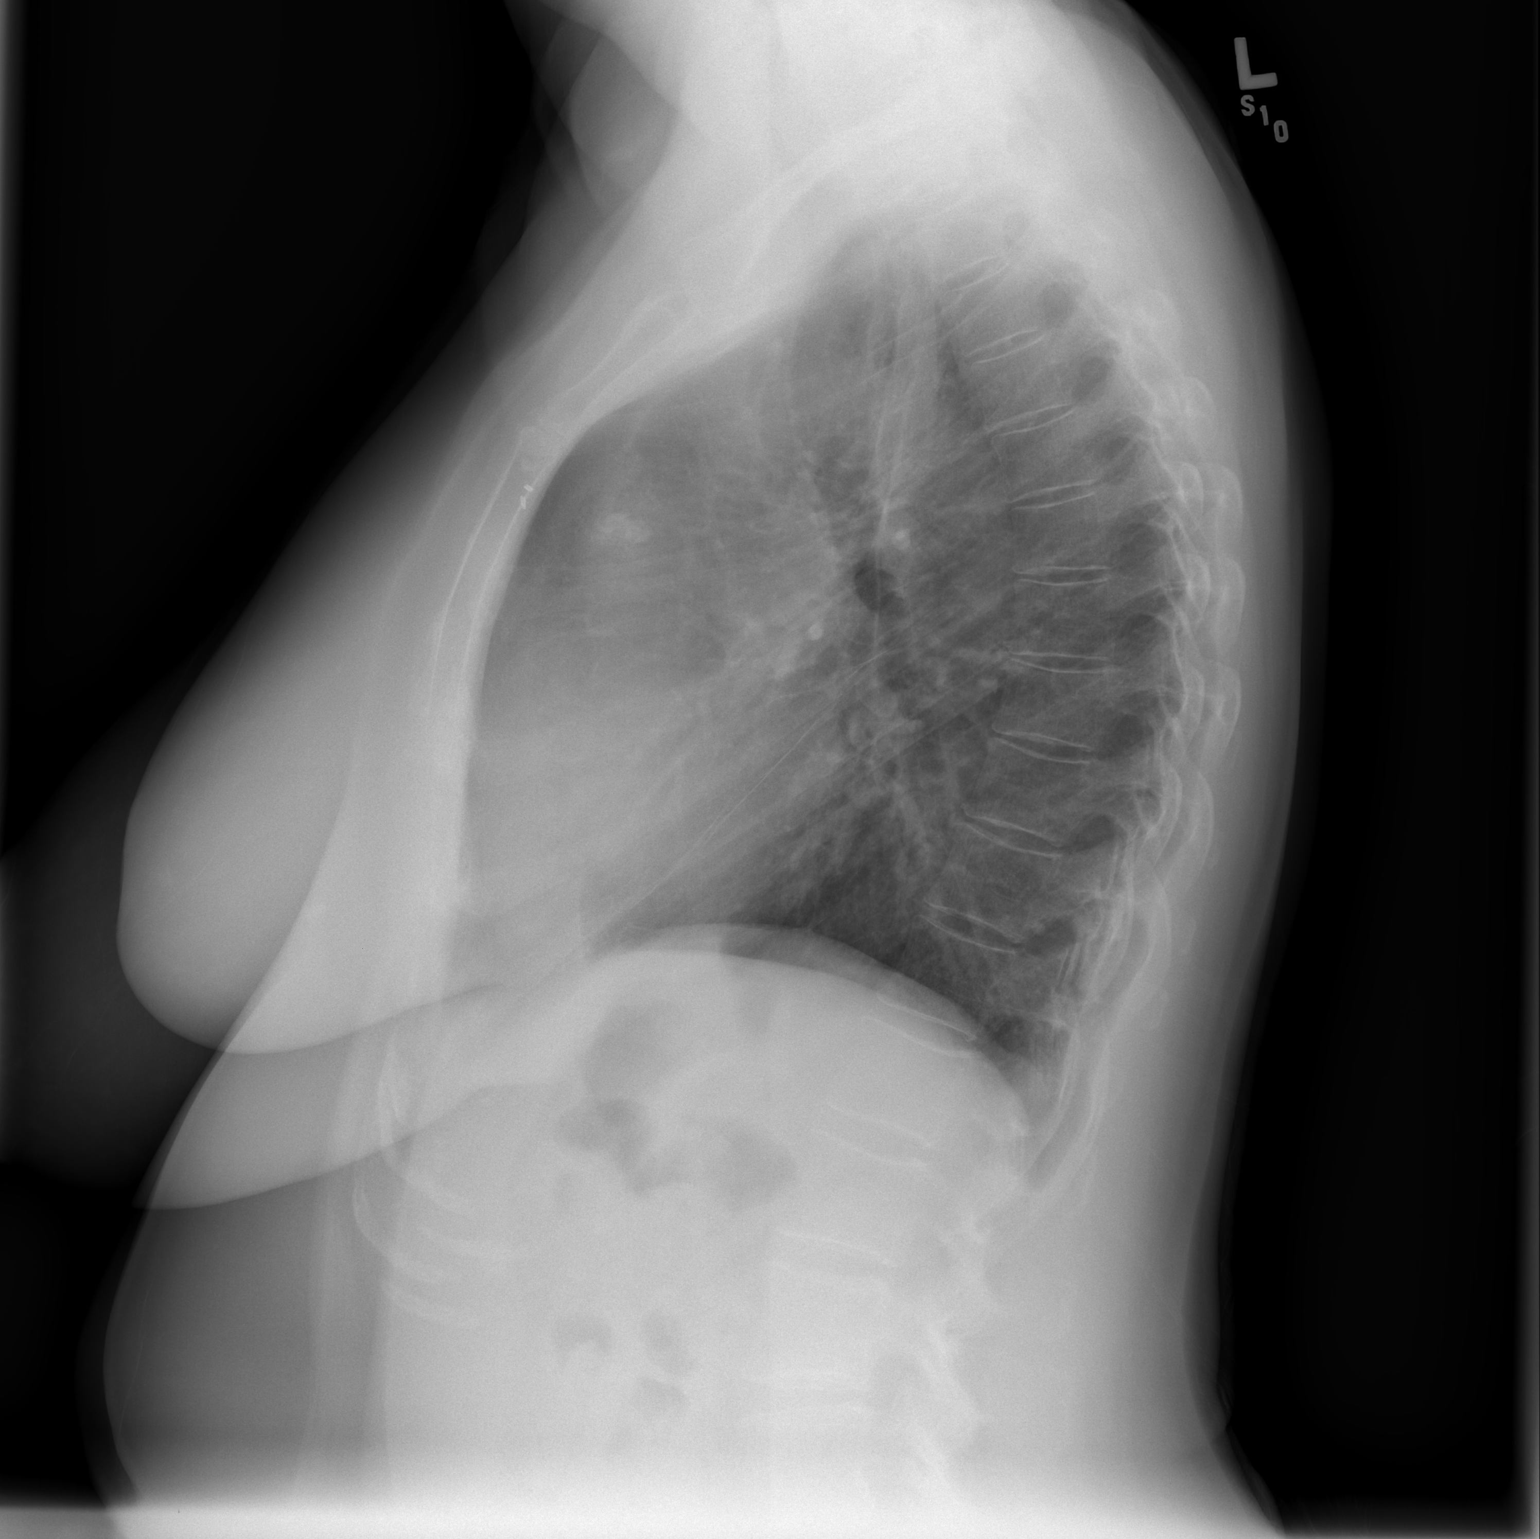

[2 of 2 positions shown; findings below may reference images not displayed]

FINDINGS: PA and lateral views reveal the heart size to be normal.  Metallic clips are noted in the region of the mediastinum with some stranding noted in the region of the left hilus.  Granulomatous changes are again noted particularly in the left hilus and as well as anteromedially in the left upper lobe.  No active lung findings.  No bony abnormality.
IMPRESSION: No change.   No active disease.

## 2010-10-08 DIAGNOSIS — Z9889 Other specified postprocedural states: Secondary | ICD-10-CM

## 2010-10-08 HISTORY — DX: Other specified postprocedural states: Z98.890

## 2010-10-16 ENCOUNTER — Emergency Department (HOSPITAL_COMMUNITY): Payer: Self-pay

## 2010-10-16 ENCOUNTER — Inpatient Hospital Stay (HOSPITAL_COMMUNITY)
Admission: EM | Admit: 2010-10-16 | Discharge: 2010-10-18 | DRG: 286 | Disposition: A | Discharge status: Home / Self Care | Payer: Self-pay | Attending: Internal Medicine | Admitting: Internal Medicine

## 2010-10-16 ENCOUNTER — Encounter (HOSPITAL_COMMUNITY): Payer: Self-pay | Admitting: Radiology

## 2010-10-16 ENCOUNTER — Inpatient Hospital Stay (HOSPITAL_COMMUNITY): Payer: Self-pay

## 2010-10-16 DIAGNOSIS — Z9221 Personal history of antineoplastic chemotherapy: Secondary | ICD-10-CM

## 2010-10-16 DIAGNOSIS — R7402 Elevation of levels of lactic acid dehydrogenase (LDH): Secondary | ICD-10-CM | POA: Diagnosis present

## 2010-10-16 DIAGNOSIS — I2510 Atherosclerotic heart disease of native coronary artery without angina pectoris: Secondary | ICD-10-CM | POA: Diagnosis present

## 2010-10-16 DIAGNOSIS — I509 Heart failure, unspecified: Secondary | ICD-10-CM | POA: Diagnosis present

## 2010-10-16 DIAGNOSIS — Z79890 Hormone replacement therapy (postmenopausal): Secondary | ICD-10-CM

## 2010-10-16 DIAGNOSIS — C8589 Other specified types of non-Hodgkin lymphoma, extranodal and solid organ sites: Secondary | ICD-10-CM | POA: Diagnosis present

## 2010-10-16 DIAGNOSIS — E039 Hypothyroidism, unspecified: Secondary | ICD-10-CM | POA: Diagnosis present

## 2010-10-16 DIAGNOSIS — Z7982 Long term (current) use of aspirin: Secondary | ICD-10-CM

## 2010-10-16 DIAGNOSIS — J189 Pneumonia, unspecified organism: Secondary | ICD-10-CM | POA: Diagnosis present

## 2010-10-16 DIAGNOSIS — Z79899 Other long term (current) drug therapy: Secondary | ICD-10-CM

## 2010-10-16 DIAGNOSIS — I5033 Acute on chronic diastolic (congestive) heart failure: Principal | ICD-10-CM | POA: Diagnosis present

## 2010-10-16 DIAGNOSIS — I447 Left bundle-branch block, unspecified: Secondary | ICD-10-CM | POA: Diagnosis present

## 2010-10-16 DIAGNOSIS — E785 Hyperlipidemia, unspecified: Secondary | ICD-10-CM | POA: Diagnosis present

## 2010-10-16 DIAGNOSIS — I10 Essential (primary) hypertension: Secondary | ICD-10-CM | POA: Diagnosis present

## 2010-10-16 DIAGNOSIS — K219 Gastro-esophageal reflux disease without esophagitis: Secondary | ICD-10-CM | POA: Diagnosis present

## 2010-10-16 DIAGNOSIS — R079 Chest pain, unspecified: Secondary | ICD-10-CM

## 2010-10-16 DIAGNOSIS — R7401 Elevation of levels of liver transaminase levels: Secondary | ICD-10-CM | POA: Diagnosis present

## 2010-10-16 DIAGNOSIS — E7800 Pure hypercholesterolemia, unspecified: Secondary | ICD-10-CM | POA: Diagnosis present

## 2010-10-16 DIAGNOSIS — Z923 Personal history of irradiation: Secondary | ICD-10-CM

## 2010-10-16 HISTORY — DX: Essential (primary) hypertension: I10

## 2010-10-16 LAB — POCT CARDIAC MARKERS
CKMB, poc: <1 ng/mL — ABNORMAL LOW (ref 1.0–8.0)
Myoglobin, poc: 57.6 ng/mL (ref 12–200)
Troponin i, poc: <0.05 ng/mL (ref 0.00–0.09)

## 2010-10-16 LAB — COMPREHENSIVE METABOLIC PANEL
ALT: 57 U/L — ABNORMAL HIGH (ref 0–35)
AST: 72 U/L — ABNORMAL HIGH (ref 0–37)
Albumin: 3.6 g/dL (ref 3.5–5.2)
BUN: 11 mg/dL (ref 6–23)
CO2: 23 mEq/L (ref 19–32)
Calcium: 10.4 mg/dL (ref 8.4–10.5)
Chloride: 105 mEq/L (ref 96–112)
Creatinine, Ser: 0.53 mg/dL (ref 0.4–1.2)
GFR calc Af Amer: >60 mL/min (ref >60)
Glucose, Bld: 90 mg/dL (ref 70–99)
Potassium: 4.6 mEq/L (ref 3.5–5.1)
Sodium: 139 mEq/L (ref 135–145)

## 2010-10-16 LAB — DIFFERENTIAL
Eosinophils Relative: 1 % (ref 0–5)
Lymphs Abs: 1.3 10*3/uL (ref 0.7–4.0)
Monocytes Absolute: 0.6 10*3/uL (ref 0.1–1.0)

## 2010-10-16 LAB — CBC
HCT: 38.7 % (ref 36.0–46.0)
Hemoglobin: 12.9 g/dL (ref 12.0–15.0)
MCV: 85.1 fL (ref 78.0–100.0)
Platelets: 249 10*3/uL (ref 150–400)
RDW: 12.9 % (ref 11.5–15.5)

## 2010-10-16 LAB — PROTIME-INR: Prothrombin Time: 13 seconds (ref 11.6–15.2)

## 2010-10-16 LAB — PRO B NATRIURETIC PEPTIDE: Pro B Natriuretic peptide (BNP): 3377 pg/mL — ABNORMAL HIGH (ref 0–125)

## 2010-10-17 ENCOUNTER — Inpatient Hospital Stay (HOSPITAL_COMMUNITY): Payer: Self-pay

## 2010-10-17 DIAGNOSIS — I509 Heart failure, unspecified: Secondary | ICD-10-CM

## 2010-10-17 HISTORY — PX: CARDIAC CATHETERIZATION: SHX172

## 2010-10-17 LAB — COMPREHENSIVE METABOLIC PANEL
AST: 95 U/L — ABNORMAL HIGH (ref 0–37)
Albumin: 3.6 g/dL (ref 3.5–5.2)
Alkaline Phosphatase: 170 U/L — ABNORMAL HIGH (ref 39–117)
BUN: 16 mg/dL (ref 6–23)
Calcium: 9.6 mg/dL (ref 8.4–10.5)
Chloride: 99 mEq/L (ref 96–112)
GFR calc Af Amer: >60 mL/min (ref >60)
GFR calc non Af Amer: >60 mL/min (ref >60)
Glucose, Bld: 101 mg/dL — ABNORMAL HIGH (ref 70–99)
Total Bilirubin: 0.5 mg/dL (ref 0.3–1.2)
Total Protein: 7.5 g/dL (ref 6.0–8.3)

## 2010-10-17 LAB — URINALYSIS, MICROSCOPIC ONLY
Bilirubin Urine: NEGATIVE
Glucose, UA: NEGATIVE mg/dL
Ketones, ur: 15 mg/dL — AB
Leukocytes, UA: NEGATIVE
pH: 6 (ref 5.0–8.0)

## 2010-10-17 LAB — POCT I-STAT 3, ART BLOOD GAS (G3+)
Acid-Base Excess: 1 mmol/L (ref 0.0–2.0)
Bicarbonate: 24.7 mEq/L — ABNORMAL HIGH (ref 20.0–24.0)
O2 Saturation: 97 %
pO2, Arterial: 86 mmHg (ref 80.0–100.0)

## 2010-10-17 LAB — CBC
HCT: 37 % (ref 36.0–46.0)
Hemoglobin: 11.9 g/dL — ABNORMAL LOW (ref 12.0–15.0)
MCH: 27.3 pg (ref 26.0–34.0)
MCHC: 32.2 g/dL (ref 30.0–36.0)
Platelets: 238 10*3/uL (ref 150–400)
WBC: 6.2 10*3/uL (ref 4.0–10.5)

## 2010-10-17 LAB — MAGNESIUM: Magnesium: 2 mg/dL (ref 1.5–2.5)

## 2010-10-17 LAB — POCT I-STAT 3, VENOUS BLOOD GAS (G3P V)
Bicarbonate: 22.7 mEq/L (ref 20.0–24.0)
TCO2: 24 mmol/L (ref 0–100)
pCO2, Ven: 39.8 mmHg — ABNORMAL LOW (ref 45.0–50.0)
pH, Ven: 7.363 — ABNORMAL HIGH (ref 7.250–7.300)

## 2010-10-17 LAB — LIPID PANEL: Triglycerides: 223 mg/dL — ABNORMAL HIGH (ref <150)

## 2010-10-17 LAB — PRO B NATRIURETIC PEPTIDE: Pro B Natriuretic peptide (BNP): 2454 pg/mL — ABNORMAL HIGH (ref 0–125)

## 2010-10-18 DIAGNOSIS — I5021 Acute systolic (congestive) heart failure: Secondary | ICD-10-CM

## 2010-10-18 LAB — BASIC METABOLIC PANEL
BUN: 20 mg/dL (ref 6–23)
CO2: 23 mEq/L (ref 19–32)
Calcium: 9.5 mg/dL (ref 8.4–10.5)
Chloride: 101 mEq/L (ref 96–112)
Creatinine, Ser: 0.7 mg/dL (ref 0.4–1.2)
Glucose, Bld: 89 mg/dL (ref 70–99)

## 2010-10-18 LAB — HEPATIC FUNCTION PANEL
ALT: 102 U/L — ABNORMAL HIGH (ref 0–35)
AST: 136 U/L — ABNORMAL HIGH (ref 0–37)
Albumin: 3.6 g/dL (ref 3.5–5.2)
Alkaline Phosphatase: 158 U/L — ABNORMAL HIGH (ref 39–117)
Total Bilirubin: 0.3 mg/dL (ref 0.3–1.2)

## 2010-10-18 LAB — CBC
HCT: 39.1 % (ref 36.0–46.0)
Hemoglobin: 12.9 g/dL (ref 12.0–15.0)
MCV: 86.1 fL (ref 78.0–100.0)
RDW: 13.4 % (ref 11.5–15.5)
WBC: 6.9 10*3/uL (ref 4.0–10.5)

## 2010-10-18 NOTE — H&P (Signed)
NAMELOUIE, Kristin Moon                ACCOUNT NO.:  [id number]  MEDICAL RECORD NO.:  [id number]           PATIENT TYPE:  I  LOCATION:  4733                         FACILITY:  [facility]  PHYSICIAN:  Conley Canal, MD      DATE OF BIRTH:  01/02/1946  DATE OF ADMISSION:  10/16/2010 DATE OF DISCHARGE:                             HISTORY & PHYSICAL   PRIMARY CARE PHYSICIAN:  [facility]  CHIEF COMPLAINT:  Shortness of breath x2 days.  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  She states that she has noticed that she is short of breath when she ambulates and today she could barely walk a few feet without getting dyspneic.  She also admits to orthopnea and she has been using two pillows instead of 1 pillow for sleep in the last few days.  She denies any chest pain.  No leg swelling.  No cough.  No diarrhea.  No nausea or vomiting or abdominal pain.  No fever.  When she presented to the emergency room, she was found to be in acute congestive heart failure with a pro BNP of 3377.  She also had a new-onset left bundle branch block.  Two sets of point-of-care cardiac enzymes were negative.  She was given Lasix 40 mg IV and started on nitroglycerin paste and was referred to the hospitalist service for further management.  Otherwise, she denies previous history of heart disease.  She does not remember the chemotherapy agents she received about 20 years ago.  PAST MEDICAL HISTORY: 1. History of non-Hodgkin's lymphoma status post chemo radiation     therapy. 2. Hypothyroidism related to radiation therapy. 3. Hypercholesteremia.  ALLERGIES:  No known drug allergies.  HOME MEDICATIONS:   Synthroid, lovastatin, and estrogen supplements.  FAMILY HISTORY:  Positive for father with acute myocardial infarction at age 62, status post CABG and a sister who required mitral valve replacement.  REVIEW OF SYSTEMS:  Unremarkable except as highlighted in the history of present illness.  SOCIAL HISTORY:  The patient is married.  She is a substitute middle Engineer, site.  She denies cigarette smoking, alcohol or illicit drugs.  PHYSICAL EXAMINATION:  GENERAL:  This is a pleasant middle-aged lady who is in mild respiratory distress. VITAL SIGNS:  Blood pressure 170 systolic, heart rate is around 105. She is febrile, oxygenating adequately, respiratory rate around 18. HEAD, EARS, NOSE AND THROAT:  Pupils are equal and reacting to light. No jugular venous distention. NECK:  No carotid bruits. RESPIRATORY:  Good air entry bilaterally with no rhonchi, rales or wheezes. CARDIOVASCULAR:  First and second heart sounds heard.  Regular tachycardia, and no murmurs. ABDOMEN:  Soft, nontender.  No palpable organomegaly.  Bowel sounds normal. CNS:  The patient is alert, oriented in person, place, and time with no acute focal neurological deficits. EXTREMITIES:  Good peripheral pulses.  No pedal  edema.  LABORATORY DATA:  Labs reviewed significant for CBC normal.  Coagulation profile normal.  Pro BNP 3377.  Sodium 139, potassium 4.6, BUN 11, creatinine 0.53, total bilirubin 0.5, alkaline phosphatase 181, AST 72, ALT 57, calcium 10.4.  Point-of-care cardiac enzymes negative x2.  EKG shows left bundle-branch block which is new compared to EKG done in January 2012.  Chest x-ray shows prominent interstitial markings with normal heart size and questionable congestive heart failure.  CT of the chest with contrast showed no evidence of pulmonary embolism, but some bilateral pleural effusions and left slightly greater than right as well as mild bilateral perihilar airspace opacities and slight  opacities in the lingular and left lower lobe, which suggests edema.  There is also some old granulomas and area of pleural thickening anteriorly in the left mid hemithorax.  IMPRESSION:  A 65 year old female with previous history of non-Hodgkin's lymphoma status post chemoradiation therapy, also hypothyroidism and hyperlipidemia who is presenting with acute shortness of breath secondary to acute congestive heart failure most likely systolic possibly related to previous radiation and chemotherapy.  She has strong family history of premature coronary artery disease and the left bundle branch block is new onset and also concerning.  She has accelerated hypertension which needs better control.  PLAN: 1. Acute congestive heart failure, new onset left bundle branch block.     The concern is for acute myocardial infarction especially with the     new onset left bundle-branch block.  We will admit the patient to     telemetry, start IV diuretics with Lasix.  Continue aspirin.  Add     low-dose angiotensin-converting enzyme inhibitor as well as beta-     blocker.  Add therapeutic doses of Lovenox.  Continue to cycle     cardiac enzymes.  Check thyroid function tests.  Restrict diet to 2     g sodium diet.  Hold statin in view of elevated liver enzymes which     could be secondary to hepatic congestion.  We will also consult     Cardiology to consider further ischemic workup.  Meanwhile, the     patient will get 2-D echocardiogram to assess ejection fraction and     for wall motion abnormalities.  We will also monitor electrolytes     closely while getting diuretics. 2. Accelerated hypertension.  The patient will be started on     angiotensin-converting enzyme inhibitor and beta blocker, target     blood pressure less than 130 systolic. 3. Hypothyroidism.  We will check TSH.  Meanwhile, continue Synthroid. 4. Hyperlipidemia.  The patient on lovastatin at home.  We will hold     this until  transaminitis were further characterized. 5. Transaminitis.  Hold lovastatin.  Check liver function tests in the     morning.  There transaminases could be elevated due to hepatic     congestion, could be element of medications, could be due to     possible ongoing acute coronary syndrome. 6. GI prophylaxis.  Protonix. 7. History of non-Hodgkin lymphoma treated 20 years ago.  No evidence     of recurrence. 8. The patient's condition is closely guarded. 9. Appreciate Cardiology input.     Conley Canal, MD     SR/MEDQ  D:  10/16/2010  T:  10/16/2010  Job:  [id number]  cc:   [facility]  Electronically Signed by Conley Canal  on 10/18/2010 07:18:15 PM

## 2010-10-22 ENCOUNTER — Encounter: Payer: Self-pay | Admitting: Nurse Practitioner

## 2010-10-22 DIAGNOSIS — E785 Hyperlipidemia, unspecified: Secondary | ICD-10-CM | POA: Insufficient documentation

## 2010-10-22 DIAGNOSIS — E0590 Thyrotoxicosis, unspecified without thyrotoxic crisis or storm: Secondary | ICD-10-CM | POA: Insufficient documentation

## 2010-10-22 DIAGNOSIS — C8590 Non-Hodgkin lymphoma, unspecified, unspecified site: Secondary | ICD-10-CM | POA: Insufficient documentation

## 2010-10-22 DIAGNOSIS — R0602 Shortness of breath: Secondary | ICD-10-CM | POA: Insufficient documentation

## 2010-10-22 NOTE — Op Note (Signed)
Kristin Moon, Kristin Moon                ACCOUNT NO.:  [id number]   MEDICAL RECORD NO.:  [id number]          PATIENT TYPE:  AMB   LOCATION:  ENDO                         FACILITY:  [facility]   PHYSICIAN:  Georgiana Spinner, M.D.    DATE OF BIRTH:  [birth year]   DATE OF PROCEDURE:  03/27/2008  DATE OF DISCHARGE:                               OPERATIVE REPORT   PROCEDURE:  Upper endoscopy with biopsy.   INDICATIONS:  Gastroesophageal reflux disease.   ANESTHESIA:  Fentanyl 100 mcg, Versed 10 mg.   PROCEDURE:  With the patient mildly sedated in the left lateral  decubitus position the Pentax videoscopic endoscope was inserted in the  mouth and passed under direct vision through the esophagus which  appeared normal until we reached the distal esophagus and there appeared  to be some changes of Barrett's, photographed and biopsied.  We entered  into the stomach.  The fundus, body, antrum, duodenal bulb, second  portion of the duodenum appeared normal.  From this point the endoscope  was slowly withdrawn taking circumferential views of duodenal mucosa  until the endoscope had been pulled back into stomach, placed in  retroflexion to view the stomach from below.  On retroflexed view we  could see Barrett's esophagus again, but by the time we could get a  biopsy forceps we had lost that view and the endoscope was then  straightened and withdrawn taking circumferential views of the remaining  gastric and esophageal mucosa.  Biopsy on direct view what I felt was  the previously noted Barrett's.  The patient's vital signs, pulse  oximeter remained stable.  The patient tolerated procedure well without  apparent complications.   FINDINGS:  Barrett's esophagus biopsied.  Await biopsy report.  The  patient will call me for results and follow-up with me as an outpatient.  Proceed to colonoscopy as planned.           ______________________________  Georgiana Spinner, M.D.     GMO/MEDQ  D:  03/27/2008  T:   03/27/2008  Job:  [id number]

## 2010-10-22 NOTE — Op Note (Signed)
Kristin Moon, Kristin Moon                ACCOUNT NO.:  [id number]   MEDICAL RECORD NO.:  [id number]          PATIENT TYPE:  AMB   LOCATION:  ENDO                         FACILITY:  [facility]   PHYSICIAN:  Georgiana Spinner, M.D.    DATE OF BIRTH:  1946-05-13   DATE OF PROCEDURE:  03/27/2008  DATE OF DISCHARGE:                               OPERATIVE REPORT   PROCEDURE:  Colonoscopy.   INDICATIONS:  Colon cancer screening.   ANESTHESIA:  Fentanyl 50 mcg, Versed 5 mg.   PROCEDURE:  With the patient mildly sedated in the left lateral  decubitus position the Pentax videoscopic colonoscope was inserted in  the rectum and passed under direct vision with pressure applied and the  patient rolled to her back.  We reached the cecum identified by  ileocecal valve and appendiceal orifice both of which were photographed.  From this point the colonoscope was slowly withdrawn taking  circumferential views of colonic mucosa stopping only in the rectum  which appeared normal on direct and showed a small polyp on retroflexed  view which we removed using snare cautery technique setting of 20/150  blended current.  Tissue was retrieved by suctioning through the  endoscope into a tissue trap.  The endoscope was then straightened and  withdrawn.  The patient's vital signs, pulse oximeter remained stable.  The patient tolerated procedure well without apparent complications.   FINDINGS:  Small polyp in rectum, seen only on retroflexed view removed  in retroflexed view.  Await biopsy report.  The patient will call me for  results and follow-up with me as an outpatient.           ______________________________  Georgiana Spinner, M.D.     GMO/MEDQ  D:  03/27/2008  T:  03/27/2008  Job:  [id number]

## 2010-10-23 LAB — CULTURE, BLOOD (ROUTINE X 2): Culture: NO GROWTH

## 2010-10-23 NOTE — Consult Note (Addendum)
NAMEJASLYNN, THOME                ACCOUNT NO.:  [id number]  MEDICAL RECORD NO.:  [id number]           PATIENT TYPE:  I  LOCATION:  4733                         FACILITY:  [facility]  PHYSICIAN:  Colleen Can. Deborah Chalk, M.D.DATE OF BIRTH:  12-11-1945  DATE OF CONSULTATION:  10/16/2010 DATE OF DISCHARGE:                                CONSULTATION   PRIMARY CARDIOLOGIST:  New.  PRIMARY MEDICAL DOCTOR:  Georgianne Fick, MD  CHIEF COMPLAINT:  Shortness of breath.  HISTORY OF PRESENT ILLNESS:  Ms. Fitzsimons is a very pleasant 65 year old female with no prior cardiac history but history of Hodgkin lymphoma, treated in 1978 by chemotherapy, then with recurrence in 1989, treated with radiation and chemotherapy at that time.  She stated she had fluid in her lungs at the time of radiation in 1989, required chest tube placement for what sounds per her description for pleural effusions. She also has history of Barrett esophagus and hyperlipidemia, as well as hypothyroidism.  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  With rest, the shortness of breath completely resolved.  She had no chest pain.  Her coworkers became concerned, so sent her to the ER.  She has also had possible orthopnea.  She does not necessarily get overly short of breath/gasping for air with lying down, however, just feels uncomfortable and has to lay, sitting more upright.  She had no lower extremity edema on waking, abdominal fullness, or changes in appetite.  Right now, she felt well except for headache.  She also had a mild low-grade fever in the ER to 100.9, last checked was 99.8.  Chest x-ray demonstrated prominent interstitial markings with normal heart size, question incipient CHF. CT angio was negative for PE and did show bilateral  pleural effusions with question edema versus infection/inflammation with an area of pleural thickening anteriorly in the mid hemithorax, for followup CT.  PAST MEDICAL HISTORY: 1. Osteoarthritis. 2. Hyperlipidemia. 3. Hypothyroidism with radiation to the thyroid in 1989. 4. Hodgkin lymphoma.     a.     Initially diagnosed in 1978, treated with chemotherapy.     b.     Recurrence in 1989 treated with radiation and chemotherapy,      with a vent that sounds like chest tube placement for possible      pleural effusions in the past.     c.     Previously followed by Dr. Arline Asp but released from      followup approximately 3 years ago. 5. Barrett esophagus by EGD in 2009.  No prior history of MI, CVA, or cardiac workup including echo and cath.  MEDICATIONS:  Calcium, lysine, estrogen/methyltestosterone replacement, Lipitor, and Synthroid.  ALLERGIES:  No known drug allergies.  SOCIAL HISTORY:  Ms. Skates lives in [place].  She is married with two children.  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  She denies any tobacco or alcohol  use.  FAMILY HISTORY:  Mother is living age 53 with only osteoporosis.  Father has history of bypass at age 33 and died in his 81s and also had a stroke.  She has one sister with open heart surgery when she was 63 years old.  She has two brothers, one of whom has had a valve replacement and pacemaker.  REVIEW OF SYSTEMS:  Positive for fever, low grade, here in the ER.  She has lost some weight, but this was done intentionally.  Eating healthier.  No chest pain whatsoever.  No syncope, edema, palpitations, or cough except for today.  She does have occasional lightheadedness. Positive for shortness of breath and dyspnea on exertion.  No hematuria, bright red blood per rectum, melena, or hematemesis.  Positive for nausea and no vomiting.  No abdominal pain.  All other systems reviewed and otherwise negative except for those  noted in HPI.  LABORATORY DATA:  WBC 7.3, hemoglobin 12.9, hematocrit 38.7, platelet count 249.  Sodium 139, potassium 4.0, chloride 105, CO2 is 23, glucose 90, BUN 11, creatinine 0.53.  ProBNP 3377.  LFTs, mildly elevated AP 181, AST 72, ALT 57.  Cardiac enzymes negative x2, otherwise, within normal limits.  RADIOLOGY:  Please see HPI.  PHYSICAL EXAMINATION:  VITAL SIGNS:  Temperature 99.8, pulse 106, respirations 20, blood pressure 163/74, pulse ox 100% on room air. GENERAL:  This is a pleasant well-appearing white female, in no acute distress. HEENT:  Normocephalic and atraumatic with extraocular movements intact. Clear sclerae.  Nares are without discharge. NECK:  Supple without carotid bruits. CHEST:  Auscultation of the heart reveals tachycardic rhythm with S1 and S2 as well as S3 without murmurs, rubs, or gallops.  Lung sounds are with a very faint rales at the bases, otherwise mildly coarse. ABDOMEN:  Soft, nontender, nondistended.  Positive bowel sounds. EXTREMITIES:  Warm, dry and without edema.  She has 2+ pedal pulses bilaterally. NEUROLOGIC:  She is alert and oriented x3, responds to questions appropriately with a normal affect.  ASSESSMENT/PLAN:  The patient was seen and examined by Dr. Deborah Chalk and myself.  This is a 65 year old female with a history of Hodgkin's, initially treated in 1978 with chemo, then with recurrence in 1989 treated with radiation and chemo; Barrett esophagus; hyperlipidemia; no prior cardiac history presents to [facility] with complaints of progressive shortness of breath, culminating and being unable to walk across the parking lot today without having to rest.  EKG demonstrates a new left bundle-branch block as well as sinus tachycardia.  CT of the chest was negative for pulmonary embolism but did demonstrate pleural effusions and questionable edema versus infection/inflammation.  She has had a low-grade fever here in the hospital,  but denies one previous at home.  CT angio did raise a question of infection versus inflammation, at this time we will check blood cultures as well as a UA and start antibiotic therapy with Rocephin per Dr. Deborah Chalk.  Given her dyspnea, an S3 on exam, as well as pleural effusion, we will check a 2-D echocardiogram but at this time, findings are felt consistent with congestive heart failure.  We agree with IV diuresis as planned by the hospitalist team.  We will also plan for right and left heart catheterization, tentatively for tomorrow, but her clinical status will be monitored given her fever for concern that there was some sort of underlying coronary disease precipitating this given her history of radiation.  Thank you for the opportunity to participate in  the care of this patient.     Bayden Gil, P.A.C.   ______________________________ Colleen Can. Deborah Chalk, M.D.    DD/MEDQ  D:  10/16/2010  T:  10/17/2010  Job:  [id number]  cc:   Georgianne Fick, M.D.  Electronically Signed by Roger Shelter M.D. on 10/23/2010 04:54:09 PM Electronically Signed by Ronie Spies  on 10/28/2010 01:50:47 PM

## 2010-10-25 ENCOUNTER — Encounter: Payer: Self-pay | Admitting: Nurse Practitioner

## 2010-10-25 ENCOUNTER — Ambulatory Visit (INDEPENDENT_AMBULATORY_CARE_PROVIDER_SITE_OTHER): Payer: Self-pay | Admitting: Nurse Practitioner

## 2010-10-25 DIAGNOSIS — I5032 Chronic diastolic (congestive) heart failure: Secondary | ICD-10-CM | POA: Insufficient documentation

## 2010-10-25 DIAGNOSIS — R7402 Elevation of levels of lactic acid dehydrogenase (LDH): Secondary | ICD-10-CM

## 2010-10-25 DIAGNOSIS — I5030 Unspecified diastolic (congestive) heart failure: Secondary | ICD-10-CM

## 2010-10-25 DIAGNOSIS — R7401 Elevation of levels of liver transaminase levels: Secondary | ICD-10-CM | POA: Insufficient documentation

## 2010-10-25 DIAGNOSIS — C8589 Other specified types of non-Hodgkin lymphoma, extranodal and solid organ sites: Secondary | ICD-10-CM

## 2010-10-25 DIAGNOSIS — C8590 Non-Hodgkin lymphoma, unspecified, unspecified site: Secondary | ICD-10-CM

## 2010-10-25 LAB — CBC WITH DIFFERENTIAL/PLATELET
Basophils Absolute: 0 10*3/uL (ref 0.0–0.1)
Basophils Relative: 0.5 % (ref 0.0–3.0)
Eosinophils Absolute: 0.1 10*3/uL (ref 0.0–0.7)
Eosinophils Relative: 1.5 % (ref 0.0–5.0)
HCT: 43.8 % (ref 36.0–46.0)
Hemoglobin: 14.9 g/dL (ref 12.0–15.0)
Lymphocytes Relative: 34.8 % (ref 12.0–46.0)
Lymphs Abs: 3.3 10*3/uL (ref 0.7–4.0)
MCHC: 34.1 g/dL (ref 30.0–36.0)
MCV: 86.7 fl (ref 78.0–100.0)
Monocytes Absolute: 0.6 10*3/uL (ref 0.1–1.0)
Monocytes Relative: 6.7 % (ref 3.0–12.0)
Neutro Abs: 5.4 10*3/uL (ref 1.4–7.7)
Neutrophils Relative %: 56.5 % (ref 43.0–77.0)
Platelets: 257 10*3/uL (ref 150.0–400.0)
RBC: 5.06 Mil/uL (ref 3.87–5.11)
RDW: 13.2 % (ref 11.5–14.6)
WBC: 9.6 10*3/uL (ref 4.5–10.5)

## 2010-10-25 LAB — HEPATIC FUNCTION PANEL
ALT: 56 U/L — ABNORMAL HIGH (ref 0–35)
AST: 53 U/L — ABNORMAL HIGH (ref 0–37)
Albumin: 3.9 g/dL (ref 3.5–5.2)
Alkaline Phosphatase: 116 U/L (ref 39–117)
Bilirubin, Direct: 0.1 mg/dL (ref 0.0–0.3)
Total Bilirubin: 0.7 mg/dL (ref 0.3–1.2)
Total Protein: 7.7 g/dL (ref 6.0–8.3)

## 2010-10-25 LAB — BASIC METABOLIC PANEL
BUN: 22 mg/dL (ref 6–23)
CO2: 28 mEq/L (ref 19–32)
Calcium: 9.8 mg/dL (ref 8.4–10.5)
Chloride: 103 mEq/L (ref 96–112)
Creatinine, Ser: 1 mg/dL (ref 0.4–1.2)
GFR: 56.93 mL/min — ABNORMAL LOW (ref >60.00)
Glucose, Bld: 105 mg/dL — ABNORMAL HIGH (ref 70–99)
Potassium: 4.4 mEq/L (ref 3.5–5.1)
Sodium: 138 mEq/L (ref 135–145)

## 2010-10-25 LAB — BRAIN NATRIURETIC PEPTIDE: Pro B Natriuretic peptide (BNP): 73 pg/mL (ref 0.0–100.0)

## 2010-10-25 NOTE — Assessment & Plan Note (Signed)
Will recheck today.

## 2010-10-25 NOTE — Assessment & Plan Note (Signed)
She still has an S3 on exam. She does not feel well. She remains nauseated and has had bouts of diarrhea. She is now off of her antibiotics. Maybe she just needs a little more time. She did have increased LFT's during that admission. She is not short of breath, having edema or PND or orthopnea.  I think we need to check her labs today and see where we stand with her BNP, chemistries and LFT's. For now, I have not increased any of her CHF medicines. I would like to see her back early next week. We will need to address Dr. Deborah Chalk retiring on her next visit. She will continue to monitor her weights and blood pressure at home.  Patient is agreeable to this plan and will call if any problems develop in the interim.

## 2010-10-25 NOTE — Patient Instructions (Addendum)
Lets check all your labs today. For right now I don't want to increase your medicines. I want to see you back on Tuesday or Wednesday next week.  Weigh yourself each morning and record. Take extra dose of diuretic for weight gain of 3 pounds in 24 hours. Limit sodium intake.

## 2010-10-25 NOTE — Assessment & Plan Note (Signed)
Previously treated with chemo and radiation. This was felt to be the possible trigger for her CHF/diastolic dysfunction.

## 2010-10-25 NOTE — Consult Note (Signed)
[name], [name] NO.:  [id number]   MEDICAL RECORD NO.:  [id number]          PATIENT TYPE:  EMS   LOCATION:  MAJO                         FACILITY:  [facility]   PHYSICIAN:  Burnard Bunting, M.D.    DATE OF BIRTH:  [birth year]   DATE OF CONSULTATION:  11/14/2005  DATE OF DISCHARGE:                                   CONSULTATION   REFERRING PHYSICIAN:  Carleene Cooper, M.D.   CHIEF COMPLAINT:  Left lower extremity pain.   HISTORY OF PRESENT ILLNESS:  Kristin Moon is a 65 year old female who fell  this morning with left lower extremity pain.  She has no other orthopedic  complaints.  There was no loss of consciousness.  She had no numbness or  tingling in her foot.  She was unable to ambulate and weightbear on that  left lower extremity.   PAST MEDICAL HISTORY:  1.  Hodgkin's lymphoma.  2.  Bilateral calcaneal fracture status post right subtalar fusion.  3.  Status post hysterectomy.   ALLERGIES:  No known drug allergies.   CURRENT MEDICATIONS:  1.  Lipitor 20 daily.  2.  Estropipate 0.625 mg p.o. daily.  3.  Synthroid 88 mcg p.o. daily.   REVIEW OF SYSTEMS:  Fourteen-point system review and noncontributory.  No  fever, chills, weight loss, shortness of breath.   PHYSICAL EXAMINATION:  VITAL SIGNS: Temperature 97.8, heart rate 74, blood  pressure 129/72, respirations 20. She is 100% saturation on room air.  CHEST: Clear to auscultation.  HEART: Regular rate and rhythm.  ABDOMEN: Exam is benign.  EXTREMITIES: Left lower extremity compartments are soft.  DP pulse 2+/4.  Dorsiflexion and plantar flexion intact.  Sensation on dorsal and plantar  aspect of foot.  Knee has no effusion.  She has a small abrasion down around  the tibial region, but this is not a fracture.   LABORATORY DATA:  Hematocrit 40, platelets 321, white count 10.4.  Sodium  139, potassium 3.8, BUN 18, creatinine 0.8, glucose 37.   Radiographs:  EKG is normal sinus rhythm.   Chest x-ray:  No air space diseased.   X-rays showed distal tibia-fibula fracture.   IMPRESSION:  Distal tibia-fibula  fracture.   PLAN:  Intramedullary nail. Risks and benefits are discussed with the  patient which include but are not limited to infection, nonunion, malunion,  and need for more surgery. The patient understands and wishes to proceed.  All questions answered.           ______________________________  G. Dorene Grebe, M.D.     GSD/MEDQ  D:  11/14/2005  T:  11/14/2005  Job:  [id number]

## 2010-10-25 NOTE — Progress Notes (Signed)
Scherrie Gerlach Date of Birth: Oct 11, 1945   History of Present Illness: Ms. Adsit is seen back today for a post hospital visit. She is seen for Dr. Deborah Chalk. She was discharged one week ago from [facility]. She had been admitted with heart failure symptoms. She has a L BBB. She was cathed. This showed an EF of 50%. Her echo showed a normal EF with diastolic dysfunction. She also had increased LFT's during that admission. There was concern for possible pneumonitis and she was sent home on antibiotics. Since her discharge, she has not been feeling good. She is dizzy with getting up. She is not really short of breath. She is nauseated. She had some diarrhea and this may be getting better. She still has some cough and congestion but thinks that is getting better. Her blood pressure at home has been good and averaging in the 115 systolic range. She does not have chest pain. She was seen by her PCP earlier in the week. No blood work was done.   Current Outpatient Prescriptions on File Prior to Visit  Medication Sig Dispense Refill  . aspirin 325 MG tablet Take 325 mg by mouth daily.        Marland Kitchen estrogens conjugated, synthetic A, (CENESTIN) 0.625 MG tablet Take 0.625 mg by mouth daily.        . furosemide (LASIX) 40 MG tablet Take 40 mg by mouth daily.        Marland Kitchen levothyroxine (SYNTHROID, LEVOTHROID) 100 MCG tablet Take 100 mcg by mouth daily.        Marland Kitchen lisinopril (PRINIVIL,ZESTRIL) 2.5 MG tablet Take 2.5 mg by mouth daily.        . metoprolol tartrate (LOPRESSOR) 25 MG tablet Take 12.5 mg by mouth 2 (two) times daily.        Marland Kitchen omeprazole (PRILOSEC OTC) 20 MG tablet Take 20 mg by mouth daily.        . potassium chloride 20 MEQ/15ML (10%) solution Take 20 mEq by mouth daily.        Marland Kitchen CALCIUM PO Take by mouth daily.        . fish oil-omega-3 fatty acids 1000 MG capsule Take by mouth daily. 2 daily        . Multiple Vitamin (MULTIVITAMIN) tablet Take 1 tablet by mouth daily.          No Known Allergies  Past  Medical History  Diagnosis Date  . Hypertension   . Non Hodgkin's lymphoma     history  . Hyperlipidemia   . SOB (shortness of breath)   . Hypothyroidism     Past Surgical History  Procedure Date  . Cardiac catheterization 10/17/2010     Left ventriculography shows mild global left ventricular hypokinesisThe LVEF is estimated at 50%.  . Abdominal hysterectomy 1983    History  Smoking status  . Never Smoker   Smokeless tobacco  . Not on file    History  Alcohol Use No    Family History  Problem Relation Age of Onset  . Heart attack Father 36  . Hypertension Father   . Heart disease Father   . Hypertension Mother     Review of Systems: The review of systems is positive for nausea, dizziness and general malaise.  All other systems were reviewed and are negative.  Physical Exam: BP 128/82  Pulse 82  Wt 150 lb (68.04 kg) Patient is pleasant and in no acute distress. She seems like she is not feeling  good. Skin is warm and dry. Color is normal to somewhat sallow.  HEENT is unremarkable. Normocephalic/atraumatic. PERRL. Sclera are nonicteric. Neck is supple. No masses. No JVD. Lungs are clear. Cardiac exam shows a regular rate and rhythm. She has an S3 noted on exam. Abdomen is soft and somewhat protruberant. Extremities are without edema. Gait and ROM are intact. No gross neurologic deficits noted.  LABORATORY DATA:  Pending   Assessment / Plan:

## 2010-10-28 ENCOUNTER — Telehealth: Payer: Self-pay | Admitting: Cardiology

## 2010-10-28 NOTE — Telephone Encounter (Signed)
Message copied by Earma Reading on Mon Oct 28, 2010  8:18 AM ------      Message from: Norma Fredrickson      Created: Mon Oct 28, 2010  8:06 AM       Ok to report. Labs are satisfactory. LFTs are coming down. BNP is low. Will reassess at her office visit this week.

## 2010-10-28 NOTE — Telephone Encounter (Signed)
Lab results reported. Pt reports still not feeling great. Pt states she is nauseated and nothing tastes right. Pt is aware of appt tomorrow.

## 2010-10-29 ENCOUNTER — Ambulatory Visit (INDEPENDENT_AMBULATORY_CARE_PROVIDER_SITE_OTHER): Payer: 59 | Admitting: Nurse Practitioner

## 2010-10-29 ENCOUNTER — Encounter: Payer: Self-pay | Admitting: Nurse Practitioner

## 2010-10-29 DIAGNOSIS — R7401 Elevation of levels of liver transaminase levels: Secondary | ICD-10-CM

## 2010-10-29 DIAGNOSIS — R7402 Elevation of levels of lactic acid dehydrogenase (LDH): Secondary | ICD-10-CM

## 2010-10-29 DIAGNOSIS — I5030 Unspecified diastolic (congestive) heart failure: Secondary | ICD-10-CM

## 2010-10-29 DIAGNOSIS — R7989 Other specified abnormal findings of blood chemistry: Secondary | ICD-10-CM

## 2010-10-29 DIAGNOSIS — R0609 Other forms of dyspnea: Secondary | ICD-10-CM

## 2010-10-29 DIAGNOSIS — C8590 Non-Hodgkin lymphoma, unspecified, unspecified site: Secondary | ICD-10-CM

## 2010-10-29 DIAGNOSIS — C8589 Other specified types of non-Hodgkin lymphoma, extranodal and solid organ sites: Secondary | ICD-10-CM

## 2010-10-29 DIAGNOSIS — R0600 Dyspnea, unspecified: Secondary | ICD-10-CM

## 2010-10-29 DIAGNOSIS — I5020 Unspecified systolic (congestive) heart failure: Secondary | ICD-10-CM

## 2010-10-29 DIAGNOSIS — R0989 Other specified symptoms and signs involving the circulatory and respiratory systems: Secondary | ICD-10-CM

## 2010-10-29 LAB — HEPATIC FUNCTION PANEL
ALT: 54 U/L — ABNORMAL HIGH (ref 0–35)
AST: 45 U/L — ABNORMAL HIGH (ref 0–37)
Albumin: 3.8 g/dL (ref 3.5–5.2)
Alkaline Phosphatase: 135 U/L — ABNORMAL HIGH (ref 39–117)
Bilirubin, Direct: 0.1 mg/dL (ref 0.0–0.3)
Total Bilirubin: 0.7 mg/dL (ref 0.3–1.2)
Total Protein: 7.4 g/dL (ref 6.0–8.3)

## 2010-10-29 LAB — BRAIN NATRIURETIC PEPTIDE: Pro B Natriuretic peptide (BNP): 49 pg/mL (ref 0.0–100.0)

## 2010-10-29 LAB — BASIC METABOLIC PANEL
BUN: 22 mg/dL (ref 6–23)
CO2: 29 mEq/L (ref 19–32)
Calcium: 10.3 mg/dL (ref 8.4–10.5)
Chloride: 102 mEq/L (ref 96–112)
Creatinine, Ser: 1 mg/dL (ref 0.4–1.2)
GFR: 62.43 mL/min (ref >60.00)
Glucose, Bld: 118 mg/dL — ABNORMAL HIGH (ref 70–99)
Potassium: 4.3 mEq/L (ref 3.5–5.1)
Sodium: 139 mEq/L (ref 135–145)

## 2010-10-29 MED ORDER — CARVEDILOL 3.125 MG PO TABS: 3.1250 mg | ORAL_TABLET | Freq: Two times a day (BID) | ORAL | Status: DC

## 2010-10-29 NOTE — Assessment & Plan Note (Signed)
LFT's were trending down. We will recheck today.

## 2010-10-29 NOTE — Assessment & Plan Note (Signed)
We will recheck her labs today. I have stopped her metoprolol and we will try her on low dose Coreg. She may or may not improve. Clinically she looks much better today. I will have her see Dr. Elease Hashimoto in about 2 to 3 weeks since Dr. Deborah Chalk is retiring. She will continue to weigh daily. Patient is agreeable to this plan and will call if any problems develop in the interim.

## 2010-10-29 NOTE — Assessment & Plan Note (Signed)
This was felt to be possible trigger for her CHF.

## 2010-10-29 NOTE — Progress Notes (Signed)
Kristin Moon Date of Birth: 08-07-1945   History of Present Illness: Kristin Moon is seen today for a follow up visit. Kristin Moon is seen for Dr. Deborah Chalk. Kristin Moon has had recent CHF exacerbation. Kristin Moon has a L BBB. EF is 50%. Kristin Moon does have diastolic dysfunction. Kristin Moon is on low dose ACE and beta blocker. Kristin Moon had increased LFT's that are trending down. Kristin Moon had possible pneumonitis and was on antibiotics. When I saw her last week, Kristin Moon was feeling very poorly. Her labs showed a normal BNP. LFT's were trending down. I am seeing her back today. Overall, Kristin Moon is improving. Kristin Moon is not short of breath. Her weight is stable. Cough is still a little congested. Kristin Moon remains very fatigued and thinks it is due to her medicines. Kristin Moon states that Kristin Moon "just can't do a lot" which is very unusual for her. The metoprolol and lisinopril are new drugs for her.   Current Outpatient Prescriptions on File Prior to Visit  Medication Sig Dispense Refill  . aspirin 325 MG tablet Take 325 mg by mouth daily.        Marland Kitchen estrogens conjugated, synthetic A, (CENESTIN) 0.625 MG tablet Take 0.625 mg by mouth daily.        . furosemide (LASIX) 40 MG tablet Take 40 mg by mouth daily.        Marland Kitchen levothyroxine (SYNTHROID, LEVOTHROID) 100 MCG tablet Take 100 mcg by mouth daily.        Marland Kitchen lisinopril (PRINIVIL,ZESTRIL) 2.5 MG tablet Take 2.5 mg by mouth daily.        Marland Kitchen omeprazole (PRILOSEC OTC) 20 MG tablet Take 20 mg by mouth daily.        . potassium chloride 20 MEQ/15ML (10%) solution Take 20 mEq by mouth daily.        Marland Kitchen DISCONTD: metoprolol tartrate (LOPRESSOR) 25 MG tablet Take 12.5 mg by mouth 2 (two) times daily.        Marland Kitchen CALCIUM PO Take by mouth daily.        . fish oil-omega-3 fatty acids 1000 MG capsule Take by mouth daily. 2 daily        . Multiple Vitamin (MULTIVITAMIN) tablet Take 1 tablet by mouth daily.          No Known Allergies  Past Medical History  Diagnosis Date  . Hypertension   . Non Hodgkin's lymphoma     history  .  Hyperlipidemia   . Hypothyroidism   . Diastolic heart failure     Grade 1 per echo May 2012  . Transaminitis   . S/P right and left heart catheterization May 2012    minimal CAD. EF of 50%  . Left bundle branch block     Past Surgical History  Procedure Date  . Cardiac catheterization 10/17/2010     Left ventriculography shows mild global left ventricular hypokinesisThe LVEF is estimated at 50%.  . Abdominal hysterectomy 1983    History  Smoking status  . Never Smoker   Smokeless tobacco  . Never Used    History  Alcohol Use No    Family History  Problem Relation Age of Onset  . Heart attack Father 41  . Hypertension Father   . Heart disease Father   . Hypertension Mother     Review of Systems: The review of systems is positive for extreme fatigue. Blood pressure at home is good. Dizziness is improving. Still with some cough but not a dry hacky cough. No shortness of breath.  All other systems were reviewed and are negative.  Physical Exam: BP 122/80  Pulse 80  Wt 151 lb (68.493 kg) Patient is very pleasant and in no acute distress. Overall, Kristin Moon looks like Kristin Moon feels better today. Skin is warm and dry. Color is normal.  HEENT is unremarkable. Normocephalic/atraumatic. PERRL. Sclera are nonicteric. Neck is supple. No masses. No JVD. Lungs are clear. Cardiac exam shows a regular rate and rhythm. Still has an S3. Abdomen is soft. Extremities are without edema. Gait and ROM are intact. No gross neurologic deficits noted.  LABORATORY DATA: Pending   Assessment / Plan:

## 2010-10-29 NOTE — Patient Instructions (Signed)
Stop the metoprolol Try Coreg 3.125 mg two times a day Lets see if this help with your fatigue We will check your labs today I will have you see Dr. Elease Hashimoto in about 2 to 3 weeks

## 2010-10-31 ENCOUNTER — Ambulatory Visit: Payer: 59 | Admitting: Nurse Practitioner

## 2010-11-05 ENCOUNTER — Telehealth: Payer: Self-pay | Admitting: Nurse Practitioner

## 2010-11-05 NOTE — Telephone Encounter (Signed)
Spoke with pt regarding pt feeling very jittery after taking Carvedilol 3.125.  Pt also feels a little lightheaded when standing at times.  BP 115/61.  No other complaints.  Norma Fredrickson NP notified and instructed RN to have pt continue same dose of Carvedilol and f/u with Dr. Elease Hashimoto on 11/25/10.  Pt notified of Lori's instructions and verbalized to RN understanding of instructions.  Also, pt was told to stand up slowly.

## 2010-11-05 NOTE — Telephone Encounter (Signed)
Pt called she said that since she started taking carvedilol she has been real jittery about 2 hours after taking it. Please call

## 2010-11-05 NOTE — Telephone Encounter (Signed)
Suggestions?

## 2010-11-05 NOTE — Telephone Encounter (Signed)
Does she feel better though than when she was on the metoprolol? If so, I would try to continue. Should have a visit with Dr. Elease Hashimoto coming up soon.

## 2010-11-11 NOTE — Discharge Summary (Signed)
Kristin Moon, Kristin Moon                ACCOUNT NO.:  [id number]  MEDICAL RECORD NO.:  [id number]           PATIENT TYPE:  I  LOCATION:  4733                         FACILITY:  [facility]  PHYSICIAN:  Conley Canal, MD      DATE OF BIRTH:  07-09-1945  DATE OF ADMISSION:  10/16/2010 DATE OF DISCHARGE:  10/18/2010                              DISCHARGE SUMMARY   PRIMARY CARE PHYSICIAN:  Kristin Fick, MD  CARDIOLOGIST:  Kristin Can. Deborah Chalk, MD consulting during this admission.  DISCHARGE DIAGNOSES: 1. Acute diastolic heart failure. 2. Suspected pneumonitis. 3. Transaminitis. 4. Accelerated hypertension. 5. Minimal nonobstructive coronary disease per cardiac cath this     admission. 6. Hypothyroidism. 7. Hyperlipidemia. 8. History of non-Hodgkin lymphoma.  DISCHARGE MEDICATIONS: 1. Aspirin 325 mg p.o. daily. 2. Lasix 40 mg p.o. daily (new). 3. Levaquin 500 mg p.o. daily x5 days. 4. Lisinopril 2.5 mg p.o. daily (new med). 5. Metoprolol 12.5 mg p.o. b.i.d. (new medication). 6. Potassium chloride 20 mEq p.o. daily (new medication). 7. Calcium over-the-counter 1 tablet p.o. daily. 8. Estrogen 0.625 mg p.o. daily. 9. Fish oil 2 capsules p.o. daily. 10.Multivitamin p.o. daily. 11.Prilosec over-the-counter p.o. daily. 12.Synthroid 100 mcg p.o. daily. 13.The patient instructed to hold lovastatin until further     recommendation by primary care physician.  PROCEDURES DURING HOSPITALIZATIONS: 1. Chest x-ray performed Oct 16, 2010 consistent with congestive heart     failure. 2. CT angio of the chest performed Oct 16, 2010, showing mild perihilar     airspace opacities consistent with pulmonary edema.  However,     infection cannot be ruled out. 3. Repeat chest x-ray performed Oct 17, 2010, showing improved CHF. 4. A 2-D echocardiogram performed Oct 17, 2010 showing LVEF of 55-60%     with no wall motion abnormalities, findings consistent with grade 1     diastolic heart failure. 5.  The cardiac cath performed Oct 17, 2010 showing minimal     nonobstructive coronary disease involving the proximal LAD with     preserved LV function of 50%.  CONSULTATIONS this ADMIT: 1. [facility], Dr. Roger Moon.  BRIEF HISTORY OF PRESENT ILLNESS:  Please see complete H and P done by Dr. Venetia Moon for complete details.  In short, [name] is a 65 year old female with prior history of non-Hodgkin lymphoma.  The patient did undergo chemoradiation in 1978 and again in 1989.  She is currently in remission.  The patient presented to emergency department on day of admission with complaints of dyspnea on exertion.  Pro-BNP obtained at time of admission was 3377.  The patient also found to have new left bundle branch block on EKG.  The patient was admitted at that time for acute heart failure exacerbation and Cardiology consultation.  COURSE OF HOSPITALIZATION: 1. Acute on chronic congestive heart failure.  Findings thought     secondary to previous chemo and radiation with history of non-     Hodgkin lymphoma with new left bundle branch block.  The patient     was diuresed successfully with IV Lasix during hospitalization.     Cardiac cath and  2-D echocardiogram findings are as mentioned     above.  The patient at this time plan as for medical management of     the patient's congestive heart failure.  The patient to be     discharged home on diuretic therapy, ACE inhibitor, beta-blocker     and potassium supplements.  The patient will follow up in heart     failure clinic on Oct 25, 2010. 2. Suspected pneumonitis.  The patient did develop low-grade fever     during hospitalization.  Urinalysis was unremarkable.  Blood     cultures x2 are negative to date.  As mentioned above, CT angio of     the chest was suspicious for some patchy air space disease.  At     this time, the patient will be discharged home on Levaquin to     follow up with primary care physician.  The patient is  maintaining     stable oxygen saturation on room air. 3. Transaminitis.  Suspect related to decompensated heart failure;     however, cannot rule out medication affect versus other etiology.     LFTs are trending down at the time of dictation.  We will continue     holding the patient statin medication until she is able to follow     up and have LFTs rechecked by primary care physician.  Should LFTs     continue to be elevated at follow up appointment, would recommend     further workup for acute hepatitis, however, low suspicion at this     time. 4. Accelerated hypertension on admission, currently stable.  Continue     medications as mentioned above. 5. Hyperlipidemia.  Again, the patient's statin medication will be     held until followup with LFTs can be obtained by primary care     physician and further recommendations made.  LABORATORY DATA:  At time of discharge; white cell count 6.9, platelet count 247, hemoglobin 12.9, hematocrit 39.1.  Sodium 136, potassium 4.3, BUN 20, creatinine 0.70, total bilirubin 0.3, alkaline phosphatase 158, AST 136, ALT 102.  Total cholesterol 167, triglycerides 223, HDL 43, LDL79.  Blood cultures x2 obtained Oct 16, 2010 with no growth to date.  TSH obtained Oct 16, 2010, 1.110.  DISPOSITION:  The patient is felt medically stable for discharge home at this time.  This is scheduled follow[facility] with [facility] on Oct 25, 2010 at 8:00 a.m.  In addition, the patient is scheduled for follow with primary care physician on Oct 22, 2010, at 2:20 p.m.  At that time, the patient will need recheck of LFTs to determine if resolution of transaminitis.  Approximately 40 minutes spent on discharge.     Cordelia Pen, NP   ______________________________ Conley Canal, MD    LE/MEDQ  D:  10/18/2010  T:  10/18/2010  Job:  [id number]  cc:   Kristin Moon, M.D. Kristin Moon, M.D.  Electronically Signed by Cordelia Pen NP on 11/05/2010  03:34:56 PM Electronically Signed by Conley Canal  on 11/11/2010 05:39:57 PM

## 2010-11-16 ENCOUNTER — Other Ambulatory Visit: Payer: Self-pay | Admitting: Nurse Practitioner

## 2010-11-18 NOTE — Telephone Encounter (Signed)
Med refill

## 2010-11-25 ENCOUNTER — Ambulatory Visit (INDEPENDENT_AMBULATORY_CARE_PROVIDER_SITE_OTHER): Payer: 59 | Admitting: Cardiovascular Disease

## 2010-11-25 ENCOUNTER — Encounter: Payer: Self-pay | Admitting: Cardiovascular Disease

## 2010-11-25 VITALS — BP 118/72 | HR 76 | Ht 62.0 in | Wt 154.2 lb

## 2010-11-25 DIAGNOSIS — I5032 Chronic diastolic (congestive) heart failure: Secondary | ICD-10-CM

## 2010-11-25 DIAGNOSIS — I509 Heart failure, unspecified: Secondary | ICD-10-CM

## 2010-11-25 NOTE — Progress Notes (Signed)
Kristin Moon Date of Birth  [birth year] [facility] [address]     [address] Kristin Moon, [place]  [address] [phone]  Fax  [phone]  History of Present Illness:  Pt is doing much better.  Still has some episodes of orthostasis - presumably due to the Coreg.  Still has dyspnea with exertion.  Not much difference with Coreg compared to Metoprolol.  She is exercising some.  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 aspirin 325 MG tablet Take 325 mg by mouth daily.        . carvedilol (COREG) 3.125 MG tablet Take 1 tablet (3.125 mg total) by mouth 2 (two) times daily.  60 tablet  11  . estrogens conjugated, synthetic A, (CENESTIN) 0.625 MG tablet Take 0.625 mg by mouth daily.        . furosemide (LASIX) 40 MG tablet TAKE 1 TABLET BY MOUTH DAILY  30 tablet  0  . KLOR-CON M20 20 MEQ tablet TAKE 1 TABLET BY MOUTH DAILY  30 tablet  0  . levothyroxine (SYNTHROID, LEVOTHROID) 100 MCG tablet Take 100 mcg by mouth daily.        Marland Kitchen lisinopril (PRINIVIL,ZESTRIL) 2.5 MG tablet TAKE 1 BY MOUTH DAILY  30 tablet  0  . omeprazole (PRILOSEC OTC) 20 MG tablet Take 20 mg by mouth daily.        Marland Kitchen DISCONTD: CALCIUM PO Take by mouth daily.        Marland Kitchen DISCONTD: fish oil-omega-3 fatty acids 1000 MG capsule Take by mouth daily. 2 daily        . DISCONTD: Multiple Vitamin (MULTIVITAMIN) tablet Take 1 tablet by mouth daily.        Marland Kitchen DISCONTD: potassium chloride 20 MEQ/15ML (10%) solution Take 20 mEq by mouth daily.          No Known Allergies  Past Medical History  Diagnosis Date  . Hypertension   . Non Hodgkin's lymphoma     history  . Hyperlipidemia   . Hypothyroidism   . Diastolic heart failure     Grade 1 per echo May 2012  . Transaminitis   . S/P right and left heart catheterization May 2012    minimal CAD. EF of 50%  . Left bundle  branch block     Past Surgical History  Procedure Date  . Cardiac catheterization 10/17/2010     Left ventriculography shows mild global left ventricular hypokinesisThe LVEF is estimated at 50%.  . Abdominal hysterectomy 1983    History  Smoking status  . Never Smoker   Smokeless tobacco  . Never Used    History  Alcohol Use No    Family History  Problem Relation Age of Onset  . Heart attack Father 33  . Hypertension Father   . Heart disease Father   . Hypertension Mother     Reviw of Systems:  Reviewed in the HPI.  All other systems are negative.  Physical Exam: BP 118/72  Pulse 76  Ht 5\' 2"  (1.575 m)  Wt 154 lb 3.2 oz (69.945 kg)  BMI 28.20 kg/m2 The patient is alert and oriented x 3.  The mood and affect are normal.  The skin is warm and dry.  Color is normal.  The HEENT exam reveals that the sclera are nonicteric.  The mucous membranes are  moist.  The carotids are 2+ without bruits.  There is no thyromegaly.  There is no JVD.  The lungs are clear.  The chest wall is non tender.  The heart exam reveals a regular rate with a normal S1 and S2.  There are no murmurs, gallops, or rubs.  The PMI is not displaced.   Abdominal exam reveals good bowel sounds.  There is no guarding or rebound.  There is no hepatosplenomegaly or tenderness.  There are no masses.  Exam of the legs reveal no clubbing, cyanosis, or edema.  The legs are without rashes.  The distal pulses are intact.  Cranial nerves II - XII are intact.  Motor and sensory functions are intact.  The gait is normal.  ECG:  Assessment / Plan:

## 2010-11-25 NOTE — Assessment & Plan Note (Signed)
Kristin Moon seems to be doing fairly well. At this point I do not want to change any of her medications. She continues to have some fatigue but she is only on the low-dose carvedilol. I would like to continue that low-dose carvedilol for the time being. She will also continue with her current dose of Lasix. I'll see her again in the office in several months

## 2010-12-02 ENCOUNTER — Ambulatory Visit: Payer: 59 | Admitting: Cardiovascular Disease

## 2010-12-18 ENCOUNTER — Other Ambulatory Visit: Payer: Self-pay | Admitting: Nurse Practitioner

## 2010-12-18 NOTE — Telephone Encounter (Signed)
Med refill

## 2010-12-19 NOTE — Cardiovascular Report (Signed)
[name], [name]                ACCOUNT NO.:  [id number]  MEDICAL RECORD NO.:  [id number]           PATIENT TYPE:  LOCATION:                                 FACILITY:  PHYSICIAN:  Veverly Fells. Excell Seltzer, MD  DATE OF BIRTH:  [birth year]  DATE OF PROCEDURE: DATE OF DISCHARGE:                           CARDIAC CATHETERIZATION   PROCEDURE: 1. Right heart catheterization. 2. Left heart catheterization. 3. Selective coronary angiography. 4. Left ventricular angiography.  PROCEDURAL INDICATIONS:  [name] is a 65 year old woman, who presented with acute congestive heart failure.  She was found to have left bundle branch block.  She had an S3 gallop on exam and findings consistent with CHF.  She was referred for right and left heart catheterization.  Risks and indications of procedure reviewed with the patient.  Informed consent was obtained.  Right groin was prepped, draped, and anesthetized with 1% lidocaine.  Using modified Seldinger technique, a 5-French sheath was placed in the right femoral artery and a 7-French sheath was placed in the right femoral vein.  A Swan-Ganz catheter was used for the right heart catheterization.  Pressures were recorded throughout the right heart chambers.  Oxygen saturations were drawn from the pulmonary artery and central aorta.  Standard Judkins catheters were used for left heart catheterization and coronary angiography.  The patient tolerated the procedure well.  There were no immediate complications.  PROCEDURAL FINDINGS:  Right atrial pressure mean of 7.  Right ventricular pressure was not recorded.  Pulmonary artery pressure 51/19 with a mean of 36.  Pulmonary capillary wedge pressure, A-wave 23, V- wave 32, mean of 24.  Left ventricular pressure 146/19.  Aortic pressure 141/66 with a mean of 96.  Oxygen saturation, aorta is 97%, pulmonary artery 66%.  Cardiac output by the Fick method is 4.6 liters per minute.  Cardiac index is 2.7 liters  per minute per meter squared.  Left ventriculography shows mild global left ventricular hypokinesis. The LVEF is estimated at 50%.  Coronary angiography:  The right coronary artery is widely patent.  It is a moderate caliber vessel.  The vessel is dominant and gives off a PDA branch.  There is no stenosis throughout.  Left main widely patent with no obstructive disease.  The left main trifurcates into the LAD, intermediate branch, and left circumflex.  LAD:  The LAD is a moderate caliber vessel that reaches the LV apex. There is mild nonobstructive plaque in the proximal and ostial LAD.  The mid and distal vessel have no significant obstructive disease.  Ramus intermedius is widely patent with no obstructive disease.  It divides into twin vessels distally.  Left circumflex:  The AV groove circumflex supplies two small proximal OMs and a medium caliber left posterolateral branch that divides into twin vessels.  There is no obstructive disease in left circumflex.  FINAL ASSESSMENT: 1. Minimal nonobstructive coronary artery disease involving the     proximal LAD with otherwise angiographically normal coronary     arteries. 2. Mild global left ventricular systolic dysfunction with mildly     increased left ventricular end-diastolic pressure. 3. Preserved cardiac output.  RECOMMENDATIONS:  The patient will continue with medical therapy for CHF.     Veverly Fells. Excell Seltzer, MD     MDC/MEDQ  D:  10/17/2010  T:  10/18/2010  Job:  [id number]  cc:   Georgianne Fick, M.D. Colleen Can. Deborah Chalk, M.D.  Electronically Signed by Tonny Bollman MD on 12/19/2010 08:47:01 AM

## 2011-03-03 ENCOUNTER — Encounter: Payer: Self-pay | Admitting: Cardiovascular Disease

## 2011-03-03 ENCOUNTER — Ambulatory Visit (INDEPENDENT_AMBULATORY_CARE_PROVIDER_SITE_OTHER): Payer: BC Managed Care – PPO | Admitting: Cardiovascular Disease

## 2011-03-03 VITALS — BP 132/78 | HR 80 | Ht 62.0 in | Wt 157.8 lb

## 2011-03-03 DIAGNOSIS — I5032 Chronic diastolic (congestive) heart failure: Secondary | ICD-10-CM

## 2011-03-03 DIAGNOSIS — I509 Heart failure, unspecified: Secondary | ICD-10-CM

## 2011-03-03 MED ORDER — CARVEDILOL 6.25 MG PO TABS: 6.2500 mg | ORAL_TABLET | Freq: Two times a day (BID) | ORAL | Status: DC

## 2011-03-03 NOTE — Assessment & Plan Note (Addendum)
Doing well.  Will increase her Coreg to 6.25 BID.  She'll continue the exercise. I'll see her again in 6 months.

## 2011-03-03 NOTE — Progress Notes (Signed)
Kristin Moon Date of Birth  06-03-1946 [facility] [address]    [address] [place], [place]  [address] [phone]  Fax  [phone]  History of Present Illness:  65 year old female with a history of diastolic dysfunction. She has done well since I last saw her. She has not had any episodes of chest pain or shortness of breath.     Current Outpatient Prescriptions on File Prior to Visit  Medication Sig Dispense Refill  . aspirin 325 MG tablet Take 325 mg by mouth daily.        . carvedilol (COREG) 3.125 MG tablet Take 1 tablet (3.125 mg total) by mouth 2 (two) times daily.  60 tablet  11  . Cholecalciferol (VITAMIN D3) 2000 UNITS TABS Take by mouth daily.        Marland Kitchen estrogens conjugated, synthetic A, (CENESTIN) 0.625 MG tablet Take 0.625 mg by mouth daily.        . furosemide (LASIX) 20 MG tablet TAKE 2 TABLETS EVERY DAY  180 tablet  3  . KLOR-CON M20 20 MEQ tablet TAKE 1 TABLET BY MOUTH DAILY  90 tablet  3  . levothyroxine (SYNTHROID, LEVOTHROID) 100 MCG tablet Take 100 mcg by mouth daily.        Marland Kitchen lisinopril (PRINIVIL,ZESTRIL) 2.5 MG tablet TAKE 1 BY MOUTH DAILY  90 tablet  3  . omeprazole (PRILOSEC OTC) 20 MG tablet Take 20 mg by mouth daily.          No Known Allergies  Past Medical History  Diagnosis Date  . Hypertension   . Non Hodgkin's lymphoma     history  . Hyperlipidemia   . Hypothyroidism   . Diastolic heart failure     Grade 1 per echo May 2012  . Transaminitis   . S/P right and left heart catheterization May 2012    minimal CAD. EF of 50%  . Left bundle branch block     Past Surgical History  Procedure Date  . Cardiac catheterization 10/17/2010     Left ventriculography shows mild global left ventricular hypokinesisThe LVEF is estimated at 50%.  . Abdominal hysterectomy 1983    History  Smoking status  . Never Smoker   Smokeless tobacco  . Never Used    History  Alcohol Use No    Family History  Problem Relation Age of Onset  . Heart  attack Father 20  . Hypertension Father   . Heart disease Father   . Hypertension Mother     Reviw of Systems:  Reviewed in the HPI.  All other systems are negative.  Physical Exam: BP 132/78  Pulse 80  Ht 5\' 2"  (1.575 m)  Wt 157 lb 12.8 oz (71.578 kg)  BMI 28.86 kg/m2 The patient is alert and oriented x 3.  The mood and affect are normal.   Skin: warm and dry.  Color is normal.    HEENT:   the sclera are nonicteric.  The mucous membranes are moist.  The carotids are 2+ without bruits.  There is no thyromegaly.  There is no JVD.    Lungs: clear.  The chest wall is non tender.    Heart: regular rate with a normal S1 and S2.  There are no murmurs, gallops, or rubs. The PMI is not displaced.     Abdomen: good bowel sounds.  There is no guarding or rebound.  There is no hepatosplenomegaly or tenderness.  There are no masses.   Extremities:  no  clubbing, cyanosis, or edema.  The legs are without rashes.  The distal pulses are intact.   Neuro:  Cranial nerves II - XII are intact.  Motor and sensory functions are intact.    The gait is normal.  ECG:  Assessment / Plan:

## 2011-09-03 ENCOUNTER — Ambulatory Visit (INDEPENDENT_AMBULATORY_CARE_PROVIDER_SITE_OTHER): Payer: Self-pay | Admitting: Cardiovascular Disease

## 2011-09-03 ENCOUNTER — Encounter: Payer: Self-pay | Admitting: Cardiovascular Disease

## 2011-09-03 VITALS — BP 117/63 | HR 95 | Ht 62.0 in | Wt 166.0 lb

## 2011-09-03 DIAGNOSIS — E785 Hyperlipidemia, unspecified: Secondary | ICD-10-CM

## 2011-09-03 DIAGNOSIS — E0590 Thyrotoxicosis, unspecified without thyrotoxic crisis or storm: Secondary | ICD-10-CM

## 2011-09-03 DIAGNOSIS — R000 Tachycardia, unspecified: Secondary | ICD-10-CM

## 2011-09-03 MED ORDER — METOPROLOL TARTRATE 25 MG PO TABS: 25.0000 mg | ORAL_TABLET | Freq: Two times a day (BID) | ORAL | Status: DC

## 2011-09-03 NOTE — Progress Notes (Signed)
Kristin Moon Date of Birth  04/13/1946 [facility] [address]    [address] [place], [place]  [address] [phone]  Fax  [phone]  History of Present Illness:  66 year old female with a history of diastolic dysfunction. She has done well since I last saw her. She has not had any episodes of chest pain or shortness of breath.     Current Outpatient Prescriptions on File Prior to Visit  Medication Sig Dispense Refill  . aspirin 325 MG tablet Take 325 mg by mouth daily.        . carvedilol (COREG) 6.25 MG tablet Take 1 tablet (6.25 mg total) by mouth 2 (two) times daily.  180 tablet  3  . Cholecalciferol (VITAMIN D3) 2000 UNITS TABS Take by mouth daily.        Marland Kitchen estrogens conjugated, synthetic A, (CENESTIN) 0.625 MG tablet Take 0.625 mg by mouth daily.        . furosemide (LASIX) 20 MG tablet TAKE 2 TABLETS EVERY DAY  180 tablet  3  . KLOR-CON M20 20 MEQ tablet TAKE 1 TABLET BY MOUTH DAILY  90 tablet  3  . levothyroxine (SYNTHROID, LEVOTHROID) 100 MCG tablet Take 100 mcg by mouth daily.        Marland Kitchen lisinopril (PRINIVIL,ZESTRIL) 2.5 MG tablet TAKE 1 BY MOUTH DAILY  90 tablet  3  . omeprazole (PRILOSEC OTC) 20 MG tablet Take 20 mg by mouth daily.          No Known Allergies  Past Medical History  Diagnosis Date  . Hypertension   . Non Hodgkin's lymphoma     history  . Hyperlipidemia   . Hypothyroidism   . Diastolic heart failure     Grade 1 per echo May 2012  . Transaminitis   . S/P right and left heart catheterization May 2012    minimal CAD. EF of 50%  . Left bundle branch block     Past Surgical History  Procedure Date  . Cardiac catheterization 10/17/2010     Left ventriculography shows mild global left ventricular hypokinesisThe LVEF is estimated at 50%.  . Abdominal hysterectomy 1983    History  Smoking status  . Never Smoker   Smokeless tobacco  . Never Used    History  Alcohol Use No    Family History  Problem Relation Age of Onset  . Heart  attack Father 43  . Hypertension Father   . Heart disease Father   . Hypertension Mother     Reviw of Systems:  Reviewed in the HPI.  All other systems are negative.  Physical Exam: BP 117/63  Pulse 95  Ht 5\' 2"  (1.575 m)  Wt 166 lb (75.297 kg)  BMI 30.36 kg/m2 The patient is alert and oriented x 3.  The mood and affect are normal.   Skin: warm and dry.  Color is normal.    HEENT:   the sclera are nonicteric.  The mucous membranes are moist.  The carotids are 2+ without bruits.  There is no thyromegaly.  There is no JVD.    Lungs: clear.  The chest wall is non tender.    Heart: regular rate with a normal S1 and S2.  There are no murmurs, gallops, or rubs. The PMI is not displaced.     Abdomen: good bowel sounds.  There is no guarding or rebound.  There is no hepatosplenomegaly or tenderness.  There are no masses.   Extremities:  no clubbing, cyanosis,  or edema.  The legs are without rashes.  The distal pulses are intact.   Neuro:  Cranial nerves II - XII are intact.  Motor and sensory functions are intact.    The gait is normal.  ECG: 09/03/2011-normal sinus rhythm. She has nonspecific ST-T wave abnormalities.  Assessment / Plan:

## 2011-09-03 NOTE — Patient Instructions (Signed)
Your physician wants you to follow-up in 6 MONTHS  You will receive a reminder letter in the mail two months in advance. If you don't receive a letter, please call our office to schedule the follow-up appointment.   Your physician has recommended you make the following change in your medication:   STOP COREG  START METOPROLOL 25 MG ONE TABLET TWO TIMES A DAY 12 HRS APART. TAKE BP WEEKLY/ BRING BP LOG NEXT VISIT

## 2011-09-03 NOTE — Assessment & Plan Note (Signed)
Kristin Moon seems to be doing well. Her heart rate is still a little fast. We will change her from carvedilol to metoprolol 25 mg twice a day which should give [place] more rate control without lowering her blood pressure. I'll see her again in 6 months for followup visit. We'll see her sooner if needed.

## 2011-09-04 ENCOUNTER — Telehealth: Payer: Self-pay | Admitting: Cardiovascular Disease

## 2011-09-04 DIAGNOSIS — R000 Tachycardia, unspecified: Secondary | ICD-10-CM

## 2011-09-04 NOTE — Telephone Encounter (Signed)
New Msg: Pharmacy calling needing 90 day supply of metoprolol for pt. Please call 90 day supply into pharmacy.

## 2011-09-08 MED ORDER — METOPROLOL TARTRATE 25 MG PO TABS: 25.0000 mg | ORAL_TABLET | Freq: Two times a day (BID) | ORAL | Status: DC

## 2011-09-08 NOTE — Telephone Encounter (Signed)
Fax Received. Refill Completed. Erastus Bartolomei Chowoe (R.M.A)   

## 2011-09-17 ENCOUNTER — Telehealth: Payer: Self-pay | Admitting: Cardiovascular Disease

## 2011-09-17 NOTE — Telephone Encounter (Signed)
Started Metoprolol Friday and since starting has decreased energy and feeling full in head.  Did take this before going into the hospital and she felt like this and was changed to Coreg.  States she just has no energy. Will forward to Dr Elease Hashimoto for review.

## 2011-09-17 NOTE — Telephone Encounter (Signed)
It is okay for her to go back on the carvedilol. I was hoping to achieve better heart rate control without lowering of blood pressure so much and I prescribed metoprolol. Have her continue the carvedilol and will see her at her next scheduled office visit.

## 2011-09-17 NOTE — Telephone Encounter (Signed)
Pt on metoprolol and having a "full" feeling in her head, and feels tired ,pls advise

## 2011-09-18 NOTE — Telephone Encounter (Signed)
Advised patient

## 2012-01-06 ENCOUNTER — Other Ambulatory Visit: Payer: Self-pay | Admitting: *Deleted

## 2012-01-06 MED ORDER — FUROSEMIDE 20 MG PO TABS: 20.0000 mg | ORAL_TABLET | Freq: Every day | ORAL | Status: DC

## 2012-01-06 MED ORDER — LISINOPRIL 2.5 MG PO TABS: 2.5000 mg | ORAL_TABLET | Freq: Every day | ORAL | Status: DC

## 2012-01-06 MED ORDER — POTASSIUM CHLORIDE CRYS ER 20 MEQ PO TBCR: 20.0000 meq | EXTENDED_RELEASE_TABLET | Freq: Every day | ORAL | Status: DC

## 2012-01-06 NOTE — Telephone Encounter (Signed)
Fax Received. Refill Completed. Renise Gillies Chowoe (R.M.A)   

## 2012-04-20 ENCOUNTER — Other Ambulatory Visit: Payer: Self-pay | Admitting: *Deleted

## 2012-04-20 MED ORDER — CARVEDILOL 6.25 MG PO TABS: 6.2500 mg | ORAL_TABLET | Freq: Two times a day (BID) | ORAL | Status: DC

## 2012-04-20 NOTE — Telephone Encounter (Signed)
Pt needs appointment then refill can be made Fax Received. Refill Completed. Kristin Moon (R.M.A)   

## 2012-04-21 ENCOUNTER — Other Ambulatory Visit: Payer: Self-pay | Admitting: *Deleted

## 2012-04-21 NOTE — Telephone Encounter (Signed)
Opened in Error.

## 2012-09-14 ENCOUNTER — Other Ambulatory Visit: Payer: Self-pay | Admitting: *Deleted

## 2012-09-14 MED ORDER — CARVEDILOL 6.25 MG PO TABS: 6.2500 mg | ORAL_TABLET | Freq: Two times a day (BID) | ORAL | Status: DC

## 2012-09-14 NOTE — Telephone Encounter (Signed)
Fax Received. Refill Completed. Aaban Griep Chowoe (R.M.A)   

## 2012-09-23 ENCOUNTER — Encounter: Payer: Self-pay | Admitting: Cardiovascular Disease

## 2012-12-02 ENCOUNTER — Ambulatory Visit (INDEPENDENT_AMBULATORY_CARE_PROVIDER_SITE_OTHER): Payer: BC Managed Care – PPO | Admitting: Cardiovascular Disease

## 2012-12-02 ENCOUNTER — Encounter: Payer: Self-pay | Admitting: Cardiovascular Disease

## 2012-12-02 VITALS — BP 122/66 | HR 73 | Ht 62.0 in | Wt 160.0 lb

## 2012-12-02 DIAGNOSIS — I5032 Chronic diastolic (congestive) heart failure: Secondary | ICD-10-CM

## 2012-12-02 DIAGNOSIS — I447 Left bundle-branch block, unspecified: Secondary | ICD-10-CM | POA: Insufficient documentation

## 2012-12-02 NOTE — Assessment & Plan Note (Signed)
Kristin Moon is doing very well. She has a history of diastolic dysfunction. She also has an intermittent left bundle branch block. Her EKG shows left bundle-branch block today. I found previous EKGs from 2012 which show a left bundle branch block. Her EKG from last year did not show a left bundle branch block. We will get an echo to follow up with her LBBB.  She remains a symptomatically. 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  I've encouraged her to stay active and to continue exercising. I see her again in one year.

## 2012-12-02 NOTE — Progress Notes (Signed)
Kristin Moon Date of Birth  March 06, 1946 [facility] [address]    [address] [place], [place]  [address] [phone]  Fax  [phone]   Problem List 1. Diastolic dysfunction. 2. intermittant LBBB   History of Present Illness:  67 year old female with a history of diastolic dysfunction. She has done well since I last saw her. She has not had any episodes of chest pain or shortness of breath.     December 02, 2012:  Kristin Moon is doing well.  She had some extra dyspnea in April, 2014.  She took and extra lasix and felt better.   She is getting some regular exercise.  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     Current Outpatient Prescriptions on File Prior to Visit  Medication Sig Dispense Refill  . aspirin 325 MG tablet Take 325 mg by mouth daily.        . carvedilol (COREG) 6.25 MG tablet Take 1 tablet (6.25 mg total) by mouth 2 (two) times daily with a meal.  180 tablet  0  . Cholecalciferol (VITAMIN D3) 2000 UNITS TABS Take by mouth daily.        Marland Kitchen estrogens conjugated, synthetic A, (CENESTIN) 0.625 MG tablet Take 0.625 mg by mouth daily.        . furosemide (LASIX) 20 MG tablet Take 1 tablet (20 mg total) by mouth daily.  180 tablet  3  . levothyroxine (SYNTHROID, LEVOTHROID) 100 MCG tablet Take 75 mcg by mouth daily.       Marland Kitchen lisinopril (PRINIVIL,ZESTRIL) 2.5 MG tablet Take 1 tablet (2.5 mg total) by mouth daily.  90 tablet  3  . lovastatin (MEVACOR) 20 MG tablet Take 20 mg by mouth at bedtime.      Marland Kitchen omeprazole (PRILOSEC OTC) 20 MG tablet Take 20 mg by mouth daily.        . potassium chloride SA (KLOR-CON M20) 20 MEQ tablet Take 1 tablet (20 mEq total) by mouth daily.  90 tablet  3   No current facility-administered medications on file prior to visit.    Allergies  Allergen Reactions  . Metoprolol     fatigue    Past Medical History  Diagnosis Date  . Hypertension   . Non Hodgkin's lymphoma     history  . Hyperlipidemia   . Hypothyroidism   . Diastolic  heart failure     Grade 1 per echo May 2012  . Transaminitis   . S/P right and left heart catheterization May 2012    minimal CAD. EF of 50%  . Left bundle branch block     Past Surgical History  Procedure Laterality Date  . Cardiac catheterization  10/17/2010     Left ventriculography shows mild global left ventricular hypokinesisThe LVEF is estimated at 50%.  . Abdominal hysterectomy  1983    History  Smoking status  . Never Smoker   Smokeless tobacco  . Never Used    History  Alcohol Use No    Family History  Problem Relation Age of Onset  . Heart attack Father 74  . Hypertension Father   . Heart disease Father   . Hypertension Mother     Reviw of Systems:  Reviewed in the HPI.  All other systems are negative.  Physical Exam: BP 122/66  Pulse 73  Ht 5\' 2"  (1.575 m)  Wt 160 lb (72.576 kg)  BMI 29.26 kg/m2 The patient is alert and oriented x 3.  The  mood and affect are normal.   Skin: warm and dry.  Color is normal.    HEENT:   the sclera are nonicteric.  The mucous membranes are moist.  The carotids are 2+ without bruits.  There is no thyromegaly.  There is no JVD.    Lungs: clear.  The chest wall is non tender.    Heart: regular rate with a normal S1 and S2.  There are no murmurs, gallops, or rubs. The PMI is not displaced.     Abdomen: good bowel sounds.  There is no guarding or rebound.  There is no hepatosplenomegaly or tenderness.  There are no masses.   Extremities:  no clubbing, cyanosis, or edema.  The legs are without rashes.  The distal pulses are intact.   Neuro:  Cranial nerves II - XII are intact.  Motor and sensory functions are intact.    The gait is normal.  ECG: 09/03/2011-normal sinus rhythm. She has nonspecific ST-T wave abnormalities.  Assessment / Plan:

## 2012-12-02 NOTE — Patient Instructions (Addendum)

## 2012-12-03 ENCOUNTER — Ambulatory Visit: Payer: Self-pay | Admitting: Cardiovascular Disease

## 2012-12-13 ENCOUNTER — Other Ambulatory Visit (HOSPITAL_COMMUNITY): Payer: BC Managed Care – PPO

## 2012-12-14 ENCOUNTER — Other Ambulatory Visit: Payer: Self-pay | Admitting: Cardiovascular Disease

## 2012-12-14 NOTE — Telephone Encounter (Signed)
Fax Received. Refill Completed. Kristin Moon (R.M.A)   

## 2012-12-15 ENCOUNTER — Ambulatory Visit (HOSPITAL_COMMUNITY): Payer: BC Managed Care – PPO | Attending: Cardiology | Admitting: Radiology

## 2012-12-15 DIAGNOSIS — R011 Cardiac murmur, unspecified: Secondary | ICD-10-CM

## 2012-12-15 DIAGNOSIS — I509 Heart failure, unspecified: Secondary | ICD-10-CM | POA: Insufficient documentation

## 2012-12-15 DIAGNOSIS — C8589 Other specified types of non-Hodgkin lymphoma, extranodal and solid organ sites: Secondary | ICD-10-CM | POA: Insufficient documentation

## 2012-12-15 DIAGNOSIS — E785 Hyperlipidemia, unspecified: Secondary | ICD-10-CM | POA: Insufficient documentation

## 2012-12-15 DIAGNOSIS — I447 Left bundle-branch block, unspecified: Secondary | ICD-10-CM | POA: Insufficient documentation

## 2012-12-15 DIAGNOSIS — I059 Rheumatic mitral valve disease, unspecified: Secondary | ICD-10-CM | POA: Insufficient documentation

## 2012-12-15 DIAGNOSIS — I10 Essential (primary) hypertension: Secondary | ICD-10-CM | POA: Insufficient documentation

## 2012-12-15 DIAGNOSIS — I5032 Chronic diastolic (congestive) heart failure: Secondary | ICD-10-CM

## 2012-12-15 DIAGNOSIS — I359 Nonrheumatic aortic valve disorder, unspecified: Secondary | ICD-10-CM | POA: Insufficient documentation

## 2012-12-15 DIAGNOSIS — I079 Rheumatic tricuspid valve disease, unspecified: Secondary | ICD-10-CM | POA: Insufficient documentation

## 2012-12-15 NOTE — Progress Notes (Signed)
Echocardiogram performed.  

## 2013-01-07 ENCOUNTER — Other Ambulatory Visit: Payer: Self-pay | Admitting: *Deleted

## 2013-01-07 MED ORDER — POTASSIUM CHLORIDE CRYS ER 20 MEQ PO TBCR: 20.0000 meq | EXTENDED_RELEASE_TABLET | Freq: Every day | ORAL | Status: DC

## 2013-01-07 MED ORDER — LISINOPRIL 2.5 MG PO TABS: 2.5000 mg | ORAL_TABLET | Freq: Every day | ORAL | Status: DC

## 2013-01-17 ENCOUNTER — Other Ambulatory Visit: Payer: Self-pay

## 2013-01-17 MED ORDER — LISINOPRIL 2.5 MG PO TABS: 2.5000 mg | ORAL_TABLET | Freq: Every day | ORAL | Status: DC

## 2013-01-17 MED ORDER — POTASSIUM CHLORIDE CRYS ER 20 MEQ PO TBCR: 20.0000 meq | EXTENDED_RELEASE_TABLET | Freq: Every day | ORAL | Status: DC

## 2013-01-17 MED ORDER — CARVEDILOL 6.25 MG PO TABS: ORAL_TABLET | ORAL | Status: DC

## 2013-01-17 MED ORDER — FUROSEMIDE 20 MG PO TABS: 20.0000 mg | ORAL_TABLET | Freq: Every day | ORAL | Status: DC

## 2013-01-24 IMAGING — CR DG CHEST 1V PORT
1 series · 1 of 1 positions shown · non-contrast
Comparison: 01/04/2007

CLINICAL DATA: Short of breath

PORTABLE CHEST - 1 VIEW

[AP]
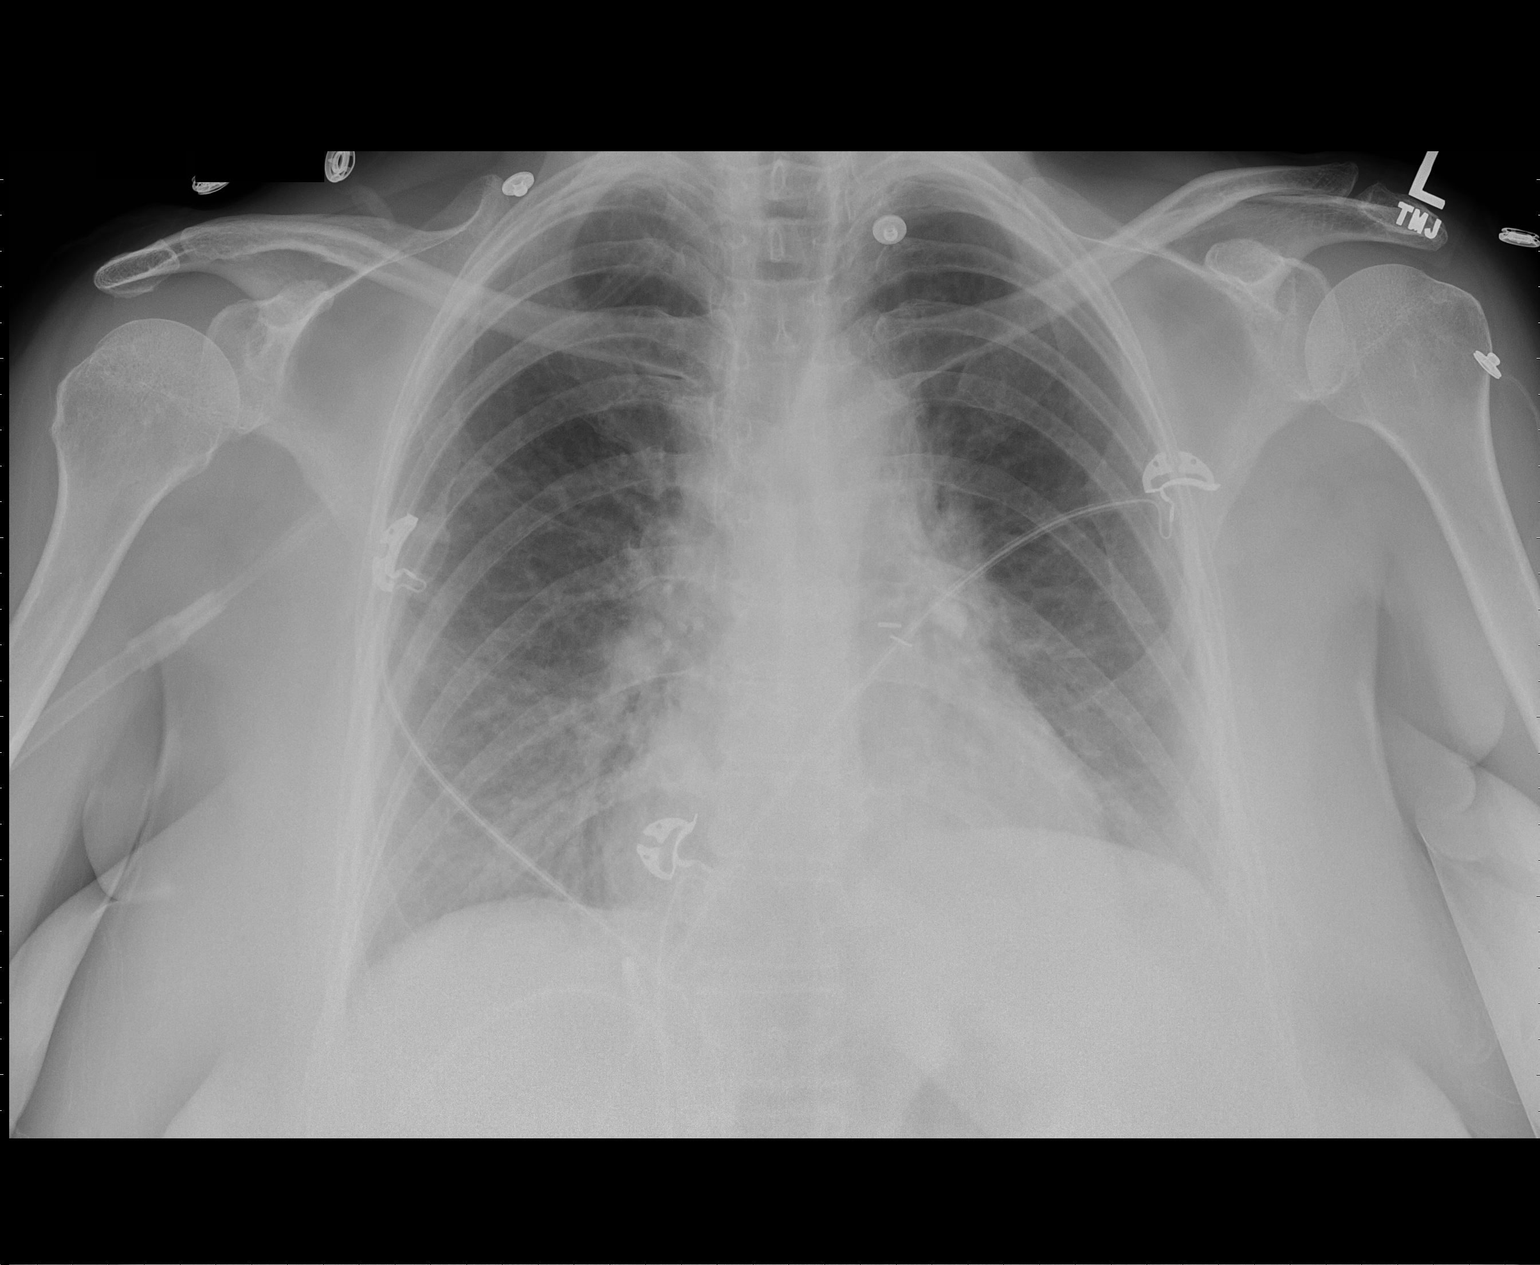

[1 of 1 positions shown; findings below may reference images not displayed]

FINDINGS: Heart size normal.  There are prominent
perihilar/interstitial markings that appear to be a new finding.
Question incipient congestive heart failure.  No lung consolidation
or pleural fluid.
IMPRESSION: Prominent interstitial markings with normal heart size.  Question
incipient congestive heart failure.

## 2013-01-25 IMAGING — CR DG CHEST 2V
2 series · 2 of 2 positions shown · non-contrast
Comparison: CT chest of 10/16/2010 and portable chest x-ray of the
same date

CLINICAL DATA: Shortness of breath, history of Hodgkin's lymphoma

CHEST - 2 VIEW

[w chest pa]
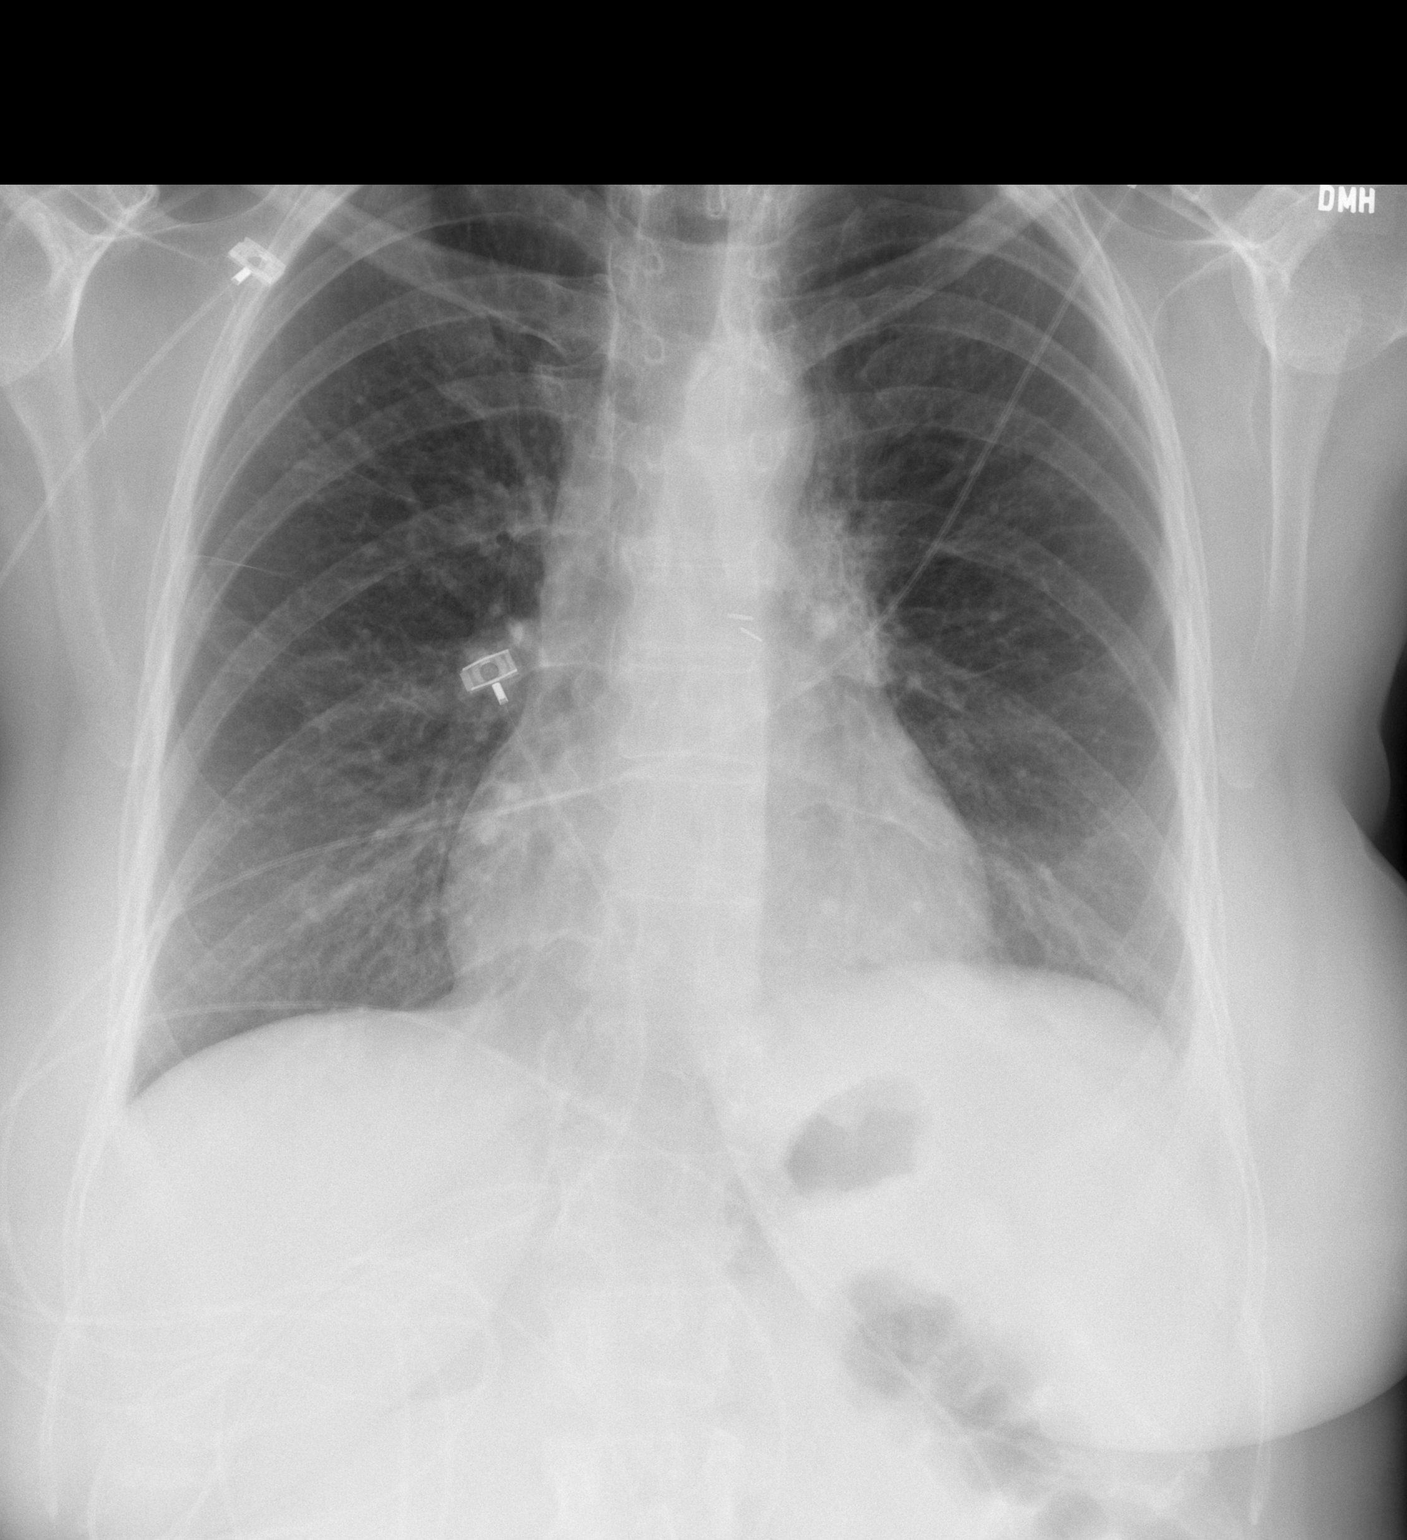

[w chest lat]
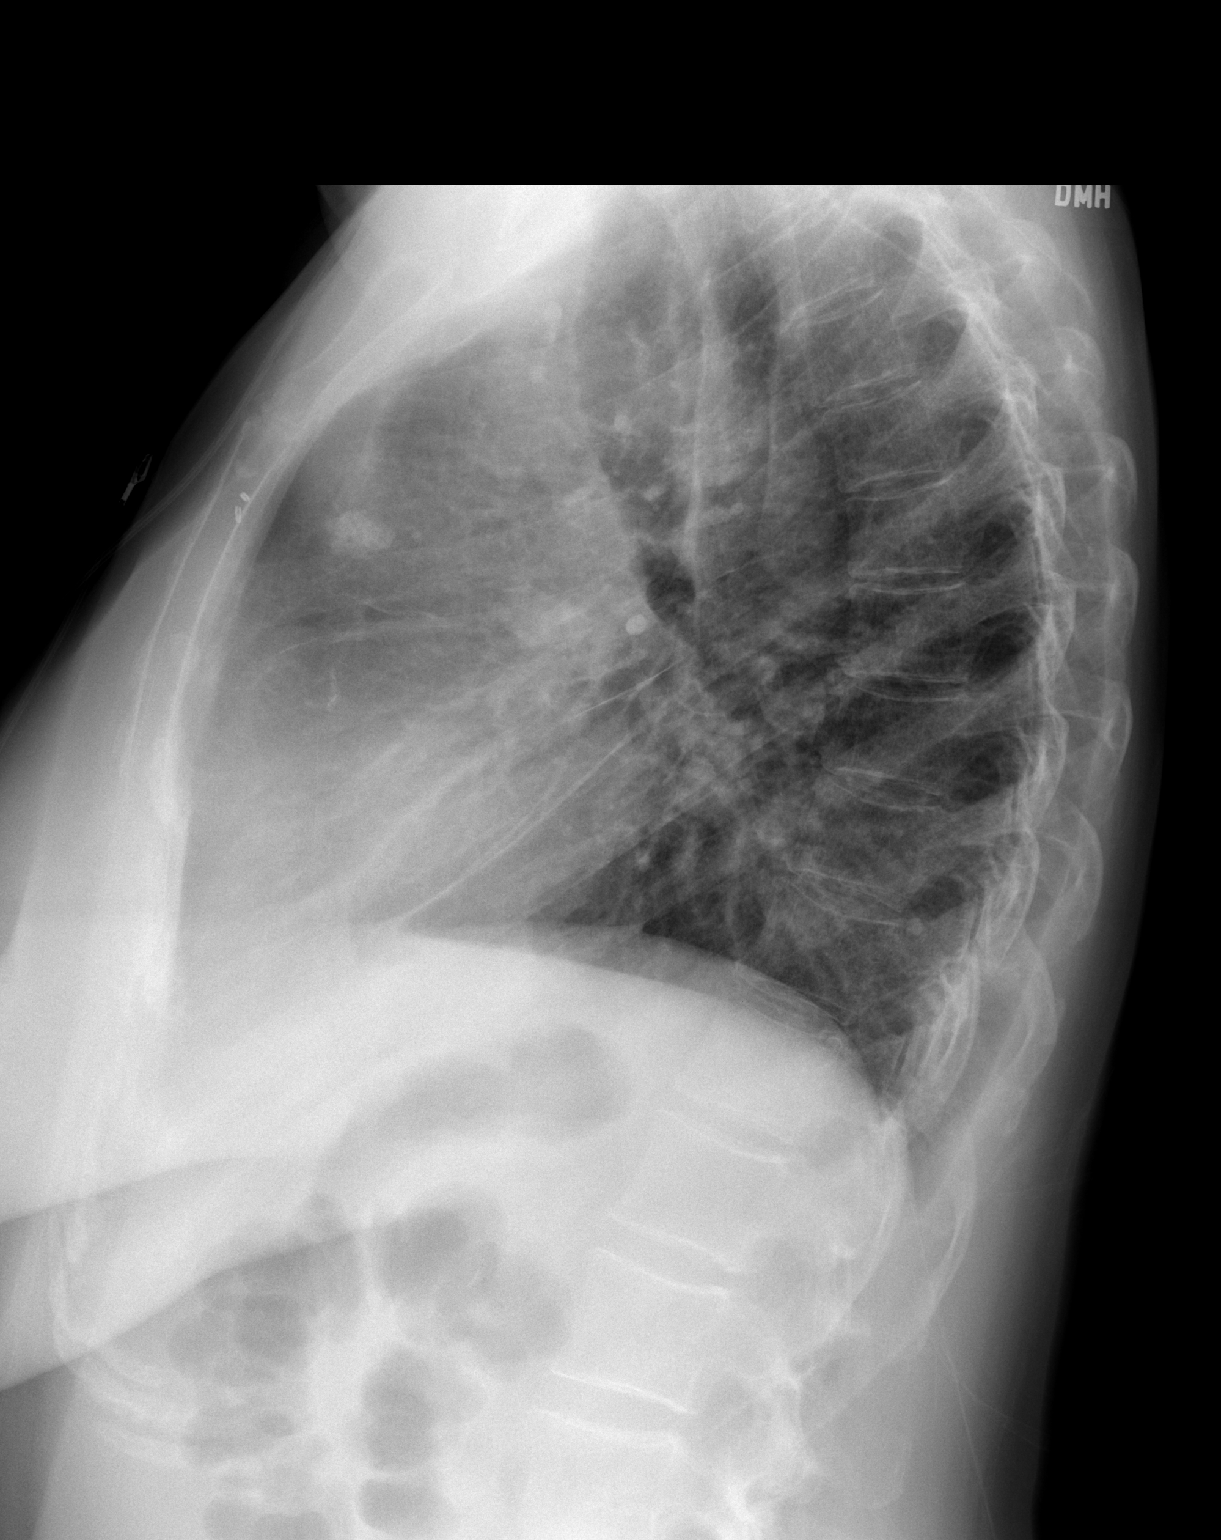

[2 of 2 positions shown; findings below may reference images not displayed]

FINDINGS: The lungs are better aerated.  Only minimal left basilar
atelectasis remains.  No definite effusion is seen although small
effusions or noted on yesterday's CT.  There may be minimal
pulmonary vascular congestion present.  The heart is mildly
enlarged.  A calcified left anterior mediastinal node is again
noted when compared to  recent CT.  No bony abnormality is seen.
IMPRESSION: Improved aeration.  Question minimal pulmonary vascular congestion.

## 2013-03-05 ENCOUNTER — Other Ambulatory Visit: Payer: Self-pay | Admitting: Cardiovascular Disease

## 2013-03-14 ENCOUNTER — Other Ambulatory Visit: Payer: Self-pay | Admitting: Cardiovascular Disease

## 2013-06-14 ENCOUNTER — Other Ambulatory Visit: Payer: Self-pay | Admitting: *Deleted

## 2013-08-02 ENCOUNTER — Other Ambulatory Visit: Payer: Self-pay | Admitting: Cardiovascular Disease

## 2013-08-15 ENCOUNTER — Other Ambulatory Visit: Payer: Self-pay | Admitting: Cardiovascular Disease

## 2013-09-12 ENCOUNTER — Other Ambulatory Visit: Payer: Self-pay | Admitting: Cardiovascular Disease

## 2013-10-03 ENCOUNTER — Other Ambulatory Visit: Payer: Self-pay | Admitting: Cardiovascular Disease

## 2013-10-07 ENCOUNTER — Other Ambulatory Visit: Payer: Self-pay | Admitting: Cardiovascular Disease

## 2013-12-03 ENCOUNTER — Other Ambulatory Visit: Payer: Self-pay | Admitting: Cardiovascular Disease

## 2013-12-06 ENCOUNTER — Ambulatory Visit (INDEPENDENT_AMBULATORY_CARE_PROVIDER_SITE_OTHER): Payer: Medicare Other | Admitting: Cardiovascular Disease

## 2013-12-06 ENCOUNTER — Encounter: Payer: Self-pay | Admitting: Cardiovascular Disease

## 2013-12-06 VITALS — BP 122/66 | HR 79 | Ht 62.0 in | Wt 165.6 lb

## 2013-12-06 DIAGNOSIS — R0602 Shortness of breath: Secondary | ICD-10-CM

## 2013-12-06 DIAGNOSIS — I5032 Chronic diastolic (congestive) heart failure: Secondary | ICD-10-CM

## 2013-12-06 MED ORDER — ASPIRIN EC 81 MG PO TBEC: 81.0000 mg | DELAYED_RELEASE_TABLET | Freq: Every day | ORAL | Status: DC

## 2013-12-06 MED ORDER — POTASSIUM CHLORIDE CRYS ER 20 MEQ PO TBCR: 20.0000 meq | EXTENDED_RELEASE_TABLET | Freq: Two times a day (BID) | ORAL | Status: DC

## 2013-12-06 MED ORDER — FUROSEMIDE 20 MG PO TABS: 20.0000 mg | ORAL_TABLET | Freq: Two times a day (BID) | ORAL | Status: DC

## 2013-12-06 NOTE — Patient Instructions (Signed)
Your physician has recommended you make the following change in your medication:  DECREASE Aspirin to 81 mg once daily TAKE Lasix 20 mg twice daily TAKE K-Dur 20 mg twice daily  CALL Christen Bame, RN in 1 month [phone]) to report how you are feeling  Your physician recommends that you return for lab work in: 1 month ([place])  Your physician wants you to follow-up in: 1 year with Dr. Acie Fredrickson.  You will receive a reminder letter in the mail two months in advance. If you don't receive a letter, please call our office to schedule the follow-up appointment.

## 2013-12-06 NOTE — Assessment & Plan Note (Signed)
Kristin Moon seems to be doing fairly well but still has shortness of breath with exertion. We'll try increasing her furosemide to 20 mg twice a day. We'll also increase her potassium chloride to 10 mg twice a day. We'll have her see [place] for basic metabolic profile in one month.   She will call [name] and  report if she's feeling better at that time.  See her in one year

## 2013-12-06 NOTE — Progress Notes (Signed)
Jalila Goodnough Date of Birth  March 21, 1946 [facility] [address]    [place], [place]  [address] [phone]  Fax  [phone]   Problem List 1. Diastolic dysfunction. 2. intermittant LBBB   History of Present Illness:  68 year old female with a history of diastolic dysfunction. She has done well since I last saw her. She has not had any episodes of chest pain or shortness of breath.     December 02, 2012:  Ceilidh is doing well.  She had some extra dyspnea in April, 2014.  She took and extra lasix and felt better.   She is getting some regular exercise.  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   December 06, 2013:    Current Outpatient Prescriptions on File Prior to Visit  Medication Sig Dispense Refill  . aspirin 325 MG tablet Take 325 mg by mouth daily.        . carvedilol (COREG) 6.25 MG tablet TAKE 1 TABLET BY MOUTH 2 TIMES DAILY WITH A MEAL.  180 tablet  3  . carvedilol (COREG) 6.25 MG tablet TAKE 1 TABLET BY MOUTH 2 TIMES DAILY WITH A MEAL.  180 tablet  0  . Cholecalciferol (VITAMIN D3) 2000 UNITS TABS Take by mouth daily.        Marland Kitchen estrogens conjugated, synthetic A, (CENESTIN) 0.625 MG tablet Take 0.625 mg by mouth daily.        . furosemide (LASIX) 20 MG tablet TAKE 1 TABLET EVERY DAY  90 tablet  0  . KLOR-CON M20 20 MEQ tablet TAKE 1 TABLET (20 MEQ TOTAL) BY MOUTH DAILY.  90 tablet  1  . levothyroxine (SYNTHROID, LEVOTHROID) 100 MCG tablet Take 75 mcg by mouth daily.       Marland Kitchen lisinopril (PRINIVIL,ZESTRIL) 2.5 MG tablet TAKE 1 TABLET (2.5 MG TOTAL) BY MOUTH DAILY.  90 tablet  1  . lovastatin (MEVACOR) 20 MG tablet Take 20 mg by mouth at bedtime.      Marland Kitchen omeprazole (PRILOSEC OTC) 20 MG tablet Take 20 mg by mouth daily.        . potassium chloride SA (KLOR-CON M20) 20 MEQ tablet Take 1 tablet (20 mEq total) by mouth daily.  90 tablet  3   No current facility-administered medications on file prior to visit.    Allergies  Allergen Reactions  . Metoprolol      fatigue    Past Medical History  Diagnosis Date  . Hypertension   . Non Hodgkin's lymphoma     history  . Hyperlipidemia   . Hypothyroidism   . Diastolic heart failure     Grade 1 per echo May 2012  . Transaminitis   . S/P right and left heart catheterization May 2012    minimal CAD. EF of 50%  . Left bundle branch block     Past Surgical History  Procedure Laterality Date  . Cardiac catheterization  10/17/2010     Left ventriculography shows mild global left ventricular hypokinesisThe LVEF is estimated at 50%.  . Abdominal hysterectomy  1983    History  Smoking status  . Never Smoker   Smokeless tobacco  . Never Used    History  Alcohol Use No    Family History  Problem Relation Age of Onset  . Heart attack Father 60  . Hypertension Father   . Heart disease Father   . Hypertension Mother     Reviw of Systems:  Reviewed in the HPI.  All other systems are negative.  Physical Exam: BP 122/66  Pulse 79  Ht 5\' 2"  (1.575 m)  Wt 165 lb 9.6 oz (75.116 kg)  BMI 30.28 kg/m2 The patient is alert and oriented x 3.  The mood and affect are normal.   Skin: warm and dry.  Color is normal.    HEENT:   the sclera are nonicteric.  The mucous membranes are moist.  The carotids are 2+ without bruits.  There is no thyromegaly.  There is no JVD.    Lungs: clear.  The chest wall is non tender.    Heart: regular rate with a normal S1 and S2.  There are no murmurs, gallops, or rubs. The PMI is not displaced.     Abdomen: good bowel sounds.  There is no guarding or rebound.  There is no hepatosplenomegaly or tenderness.  There are no masses.   Extremities:  no clubbing, cyanosis, or edema.  The legs are without rashes.  The distal pulses are intact.   Neuro:  Cranial nerves II - XII are intact.  Motor and sensory functions are intact.    The gait is normal.  ECG: December 06, 2013:  NSR at 70.  LBBB.  Assessment / Plan:

## 2013-12-12 ENCOUNTER — Other Ambulatory Visit: Payer: Self-pay | Admitting: Cardiovascular Disease

## 2014-01-05 ENCOUNTER — Other Ambulatory Visit (INDEPENDENT_AMBULATORY_CARE_PROVIDER_SITE_OTHER): Payer: Medicare Other

## 2014-01-05 DIAGNOSIS — R0602 Shortness of breath: Secondary | ICD-10-CM

## 2014-01-05 DIAGNOSIS — I5032 Chronic diastolic (congestive) heart failure: Secondary | ICD-10-CM

## 2014-01-05 LAB — BASIC METABOLIC PANEL
BUN: 16 mg/dL (ref 6–23)
CALCIUM: 9.4 mg/dL (ref 8.4–10.5)
CO2: 25 meq/L (ref 19–32)
CREATININE: 0.8 mg/dL (ref 0.4–1.2)
Chloride: 104 mEq/L (ref 96–112)
GFR: 76.28 mL/min (ref >60.00)
Glucose, Bld: 97 mg/dL (ref 70–99)
Potassium: 4.3 mEq/L (ref 3.5–5.1)
Sodium: 138 mEq/L (ref 135–145)

## 2014-01-06 ENCOUNTER — Telehealth: Payer: Self-pay | Admitting: Nurse Practitioner

## 2014-01-06 NOTE — Telephone Encounter (Signed)
Message copied by Emmaline Life on Fri Jan 06, 2014  2:51 PM ------      Message from: Thayer Headings      Created: Thu Jan 05, 2014  5:59 PM       Labs are stable       ------

## 2014-01-06 NOTE — Telephone Encounter (Signed)
Reviewed stable labs with patient who verbalized understanding.  Patient was told to call me in 1 month to report how she is feeling.  Patient reports that the SOB is better and she is scheduled to see a pulmonologist in August.  I advised patient to call back as needed after appointment with pulmonologist.  Patient verbalized understanding and agreement.

## 2014-01-18 ENCOUNTER — Encounter: Payer: Self-pay | Admitting: Pulmonary Disease

## 2014-01-18 ENCOUNTER — Encounter (INDEPENDENT_AMBULATORY_CARE_PROVIDER_SITE_OTHER): Payer: Self-pay

## 2014-01-18 ENCOUNTER — Ambulatory Visit (INDEPENDENT_AMBULATORY_CARE_PROVIDER_SITE_OTHER): Payer: Medicare Other | Admitting: Pulmonary Disease

## 2014-01-18 VITALS — BP 150/64 | HR 79 | Temp 98.0°F | Ht 62.0 in | Wt 168.6 lb

## 2014-01-18 DIAGNOSIS — R0602 Shortness of breath: Secondary | ICD-10-CM

## 2014-01-18 DIAGNOSIS — R0609 Other forms of dyspnea: Secondary | ICD-10-CM

## 2014-01-18 DIAGNOSIS — R0989 Other specified symptoms and signs involving the circulatory and respiratory systems: Secondary | ICD-10-CM

## 2014-01-18 DIAGNOSIS — R0600 Dyspnea, unspecified: Secondary | ICD-10-CM

## 2014-01-18 NOTE — Assessment & Plan Note (Signed)
She does not appear to have any pulmonary cause of dyspnea. There is no evidence of airway obstruction today spirometry. CT chest from 2012 does not show any evidence of pulmonary embolism or interstitial lung disease. There is no evidence of pulmonary hypertension an echo . As such I do not feel that bronchodilators would be helpful. I would continue treatment for diastolic CHF at this time

## 2014-01-18 NOTE — Progress Notes (Signed)
Subjective:    Patient ID: Kristin Moon, female    DOB: January 23, 1946, 68 y.o.   MRN: [id number]  HPI  68 year old never smoker referred for evaluation of dyspnea. She has a history of diastolic CHF and left bundle branch block. Cardiac cath showed normal coronaries in 2012.echo in 12/26/12 showed moderate TR and moderate aortic regurgitation, with normal LV function. She had non-Hodgkin's lymphoma diagnosed by mediastinoscopy in 1979 with recurrence in 89 treated with chemotherapy and radiation. CT angiogram in 2012 for an admission for diastolic CHF, showed bilateral effusions and no evidence of embolism. Chest x-ray on 12/15/13 does not show infiltrates or effusions. On her last visit with cardiology in 12/06/13 (Nahser, Lasix was increased to 20 twice a day She denies wheezing or cough. Spirometry on 11/15/13 was of poor effort and showed an FEV1 ratio of 57, with FEV1 of 75% and FVC of 100%. On 01/18/14, there is no evidence of airway obstruction with a ratio 78, an FEV1 of 71% suggesting mild restriction.  Past Medical History  Diagnosis Date  . Hypertension   . Non Hodgkin's lymphoma     history  . Hyperlipidemia   . Hypothyroidism   . Diastolic heart failure     Grade 1 per echo May 2012  . Transaminitis   . S/P right and left heart catheterization May 2012    minimal CAD. EF of 50%  . Left bundle branch block   . Non Hodgkin's lymphoma     Past Surgical History  Procedure Laterality Date  . Cardiac catheterization  10/17/2010     Left ventriculography shows mild global left ventricular hypokinesisThe LVEF is estimated at 50%.  . Abdominal hysterectomy  1983    Allergies  Allergen Reactions  . Metoprolol     fatigue    History   Social History  . Marital Status: Married    Spouse Name: N/A    Number of Children: 2  . Years of Education: N/A   Occupational History  . substitute teacher    Social History Main Topics  . Smoking status: Never Smoker   . Smokeless  tobacco: Never Used  . Alcohol Use: No  . Drug Use: No  . Sexual Activity: Not on file   Other Topics Concern  . Not on file   Social History Narrative  . No narrative on file     Review of Systems  Constitutional: Negative for fever and unexpected weight change.  HENT: Negative for congestion, dental problem, ear pain, nosebleeds, postnasal drip, rhinorrhea, sinus pressure, sneezing, sore throat and trouble swallowing.   Eyes: Negative for redness and itching.  Respiratory: Positive for shortness of breath. Negative for cough, chest tightness and wheezing.   Cardiovascular: Negative for palpitations and leg swelling.  Gastrointestinal: Negative for nausea and vomiting.  Genitourinary: Negative for dysuria.  Musculoskeletal: Negative for joint swelling.  Skin: Negative for rash.  Neurological: Negative for headaches.  Hematological: Does not bruise/bleed easily.  Psychiatric/Behavioral: Negative for dysphoric mood. The patient is not nervous/anxious.        Objective:   Physical Exam  Gen. Pleasant, well-nourished, in no distress, normal affect ENT - no lesions, no post nasal drip Neck: No JVD, no thyromegaly, no carotid bruits Lungs: no use of accessory muscles, no dullness to percussion, clear without rales or rhonchi  Cardiovascular: Rhythm regular, heart sounds  normal, no murmurs or gallops, no peripheral edema Abdomen: soft and non-tender, no hepatosplenomegaly, BS normal. Musculoskeletal: No deformities, no cyanosis or  clubbing Neuro:  alert, non focal       Assessment & Plan:

## 2014-01-18 NOTE — Patient Instructions (Signed)
Your shortness of breath is likely related to your heart condition - diastolic heart failure You do not need breathing medications

## 2014-05-09 ENCOUNTER — Other Ambulatory Visit: Payer: Self-pay | Admitting: Cardiovascular Disease

## 2014-07-07 DIAGNOSIS — Z1231 Encounter for screening mammogram for malignant neoplasm of breast: Secondary | ICD-10-CM | POA: Diagnosis not present

## 2014-11-20 DIAGNOSIS — I10 Essential (primary) hypertension: Secondary | ICD-10-CM | POA: Diagnosis not present

## 2014-11-20 DIAGNOSIS — Z0000 Encounter for general adult medical examination without abnormal findings: Secondary | ICD-10-CM | POA: Diagnosis not present

## 2014-11-20 DIAGNOSIS — Z1389 Encounter for screening for other disorder: Secondary | ICD-10-CM | POA: Diagnosis not present

## 2014-11-27 DIAGNOSIS — E039 Hypothyroidism, unspecified: Secondary | ICD-10-CM | POA: Diagnosis not present

## 2014-11-27 DIAGNOSIS — Z0000 Encounter for general adult medical examination without abnormal findings: Secondary | ICD-10-CM | POA: Diagnosis not present

## 2014-11-27 DIAGNOSIS — E782 Mixed hyperlipidemia: Secondary | ICD-10-CM | POA: Diagnosis not present

## 2014-11-27 DIAGNOSIS — I10 Essential (primary) hypertension: Secondary | ICD-10-CM | POA: Diagnosis not present

## 2014-12-14 ENCOUNTER — Ambulatory Visit (INDEPENDENT_AMBULATORY_CARE_PROVIDER_SITE_OTHER): Payer: Medicare Other | Admitting: Cardiovascular Disease

## 2014-12-14 ENCOUNTER — Encounter: Payer: Self-pay | Admitting: Cardiovascular Disease

## 2014-12-14 ENCOUNTER — Other Ambulatory Visit: Payer: Self-pay | Admitting: Cardiovascular Disease

## 2014-12-14 ENCOUNTER — Other Ambulatory Visit: Payer: Self-pay

## 2014-12-14 VITALS — BP 120/64 | HR 71 | Ht 62.0 in | Wt 165.8 lb

## 2014-12-14 DIAGNOSIS — E785 Hyperlipidemia, unspecified: Secondary | ICD-10-CM

## 2014-12-14 DIAGNOSIS — I5032 Chronic diastolic (congestive) heart failure: Secondary | ICD-10-CM | POA: Diagnosis not present

## 2014-12-14 DIAGNOSIS — I447 Left bundle-branch block, unspecified: Secondary | ICD-10-CM

## 2014-12-14 LAB — LIPID PANEL
CHOL/HDL RATIO: 4
Cholesterol: 160 mg/dL (ref 0–200)
HDL: 36.5 mg/dL — AB (ref >39.00)
NonHDL: 123.5
Triglycerides: 259 mg/dL — ABNORMAL HIGH (ref 0.0–149.0)
VLDL: 51.8 mg/dL — ABNORMAL HIGH (ref 0.0–40.0)

## 2014-12-14 LAB — HEPATIC FUNCTION PANEL
ALBUMIN: 3.7 g/dL (ref 3.5–5.2)
ALK PHOS: 115 U/L (ref 39–117)
ALT: 26 U/L (ref 0–35)
AST: 27 U/L (ref 0–37)
BILIRUBIN DIRECT: 0.1 mg/dL (ref 0.0–0.3)
TOTAL PROTEIN: 7.1 g/dL (ref 6.0–8.3)
Total Bilirubin: 0.5 mg/dL (ref 0.2–1.2)

## 2014-12-14 LAB — BASIC METABOLIC PANEL
BUN: 15 mg/dL (ref 6–23)
CHLORIDE: 103 meq/L (ref 96–112)
CO2: 28 mEq/L (ref 19–32)
CREATININE: 0.76 mg/dL (ref 0.40–1.20)
Calcium: 9.8 mg/dL (ref 8.4–10.5)
GFR: 80.7 mL/min (ref >60.00)
Glucose, Bld: 69 mg/dL — ABNORMAL LOW (ref 70–99)
POTASSIUM: 4.2 meq/L (ref 3.5–5.1)
Sodium: 138 mEq/L (ref 135–145)

## 2014-12-14 LAB — LDL CHOLESTEROL, DIRECT: Direct LDL: 86 mg/dL

## 2014-12-14 MED ORDER — CARVEDILOL 6.25 MG PO TABS: ORAL_TABLET | ORAL | Status: DC

## 2014-12-14 NOTE — Progress Notes (Signed)
Kristin Moon Date of Birth  January 19, 1946 [facility] [address]    [place], [place]  [address] [phone]  Fax  [phone]   Problem List 1. Diastolic dysfunction. 2. intermittant LBBB   History of Present Illness:  69 year old female with a history of diastolic dysfunction. She has done well since I last saw her. She has not had any episodes of chest pain or shortness of breath.     December 02, 2012:  Kristin Moon is doing well.  She had some extra dyspnea in April, 2014.  She took and extra lasix and felt better.   She is getting some regular exercise.  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   December 14, 2014:  Kristin Moon is doing very well. I saw her last year. She increased her Lasix for about a month but did not feel any different. She resumed her previous Lasix dose and now is feeling quite well.  She denies any chest pain or short of breath. She is able to do her normal activities. 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   Current Outpatient Prescriptions on File Prior to Visit  Medication Sig Dispense Refill  . aspirin EC 81 MG tablet Take 1 tablet (81 mg total) by mouth daily.    . carvedilol (COREG) 6.25 MG tablet TAKE 1 TABLET BY MOUTH 2 TIMES DAILY WITH A MEAL. 180 tablet 3  . Cholecalciferol (VITAMIN D3) 2000 UNITS TABS Take by mouth daily.      Marland Kitchen estrogens conjugated, synthetic A, (CENESTIN) 0.625 MG tablet Take 0.625 mg by mouth daily.      . fluticasone (FLONASE) 50 MCG/ACT nasal spray Place 1 spray into both nostrils as needed for allergies or rhinitis.    . furosemide (LASIX) 20 MG tablet Take 1 tablet (20 mg total) by mouth 2 (two) times daily. 180 tablet 3  . lisinopril (PRINIVIL,ZESTRIL) 2.5 MG tablet TAKE 1 TABLET (2.5 MG TOTAL) BY MOUTH DAILY. 90 tablet 2  . Multiple Vitamin (MULTIVITAMIN) capsule Take 1 capsule by mouth daily.    Marland Kitchen omeprazole (PRILOSEC OTC) 20 MG tablet Take 20 mg by mouth daily.      . potassium chloride SA (KLOR-CON  M20) 20 MEQ tablet Take 1 tablet (20 mEq total) by mouth 2 (two) times daily. 180 tablet 3   No current facility-administered medications on file prior to visit.    Allergies  Allergen Reactions  . Metoprolol     fatigue    Past Medical History  Diagnosis Date  . Hypertension   . Non Hodgkin's lymphoma     history  . Hyperlipidemia   . Hypothyroidism   . Diastolic heart failure     Grade 1 per echo May 2012  . Transaminitis   . S/P right and left heart catheterization May 2012    minimal CAD. EF of 50%  . Left bundle branch block   . Non Hodgkin's lymphoma     Past Surgical History  Procedure Laterality Date  . Cardiac catheterization  10/17/2010     Left ventriculography shows mild global left ventricular hypokinesisThe LVEF is estimated at 50%.  . Abdominal hysterectomy  1983    History  Smoking status  . Never Smoker   Smokeless tobacco  . Never Used    History  Alcohol Use No    Family History  Problem Relation Age of Onset  . Heart attack Father 59  . Hypertension Father   .  Heart disease Father   . Hypertension Mother     Reviw of Systems:  Reviewed in the HPI.  All other systems are negative.  Physical Exam: BP 120/64 mmHg  Pulse 71  Ht 5\' 2"  (1.575 m)  Wt 75.206 kg (165 lb 12.8 oz)  BMI 30.32 kg/m2 The patient is alert and oriented x 3.  The mood and affect are normal.   Skin: warm and dry.  Color is normal.    HEENT:   the sclera are nonicteric.  The mucous membranes are moist.  The carotids are 2+ without bruits.  There is no thyromegaly.  There is no JVD.    Lungs: clear.  The chest wall is non tender.    Heart: regular rate with a normal S1 and S2.  There are no murmurs, gallops, or rubs. The PMI is not displaced.     Abdomen: good bowel sounds.  There is no guarding or rebound.  There is no hepatosplenomegaly or tenderness.  There are no masses.   Extremities:  no clubbing, cyanosis, or edema.  The legs are without rashes.  The  distal pulses are intact.   Neuro:  Cranial nerves II - XII are intact.  Motor and sensory functions are intact.    The gait is normal.  ECG: December 14, 2014:  NSR, LBBB , HR is 71   Assessment and Plan   1. Diastolic dysfunction. - doing well.  No changes in medications.  Will see her in 1 year.     2. intermittant LBBB - stable     Nahser, Wonda Cheng, MD  12/14/2014 10:52 AM    [place],  [place], [place]  [address] Pager [phone] Phone: [phone]; Fax: [phone]   [facility]  [address] [place], [place]  [address] [phone]    Fax [phone]

## 2014-12-14 NOTE — Patient Instructions (Signed)

## 2015-01-29 ENCOUNTER — Other Ambulatory Visit: Payer: Self-pay | Admitting: Cardiovascular Disease

## 2015-01-29 DIAGNOSIS — H524 Presbyopia: Secondary | ICD-10-CM | POA: Diagnosis not present

## 2015-01-29 DIAGNOSIS — H2513 Age-related nuclear cataract, bilateral: Secondary | ICD-10-CM | POA: Diagnosis not present

## 2015-01-29 DIAGNOSIS — H43813 Vitreous degeneration, bilateral: Secondary | ICD-10-CM | POA: Diagnosis not present

## 2015-02-23 ENCOUNTER — Other Ambulatory Visit: Payer: Self-pay | Admitting: Physician Assistant

## 2015-02-23 DIAGNOSIS — C44319 Basal cell carcinoma of skin of other parts of face: Secondary | ICD-10-CM | POA: Diagnosis not present

## 2015-04-02 DIAGNOSIS — H35033 Hypertensive retinopathy, bilateral: Secondary | ICD-10-CM | POA: Diagnosis not present

## 2015-04-02 DIAGNOSIS — H25011 Cortical age-related cataract, right eye: Secondary | ICD-10-CM | POA: Diagnosis not present

## 2015-04-02 DIAGNOSIS — H524 Presbyopia: Secondary | ICD-10-CM | POA: Diagnosis not present

## 2015-04-02 DIAGNOSIS — H2513 Age-related nuclear cataract, bilateral: Secondary | ICD-10-CM | POA: Diagnosis not present

## 2015-04-12 DIAGNOSIS — Z23 Encounter for immunization: Secondary | ICD-10-CM | POA: Diagnosis not present

## 2015-04-20 DIAGNOSIS — L812 Freckles: Secondary | ICD-10-CM | POA: Diagnosis not present

## 2015-04-20 DIAGNOSIS — L821 Other seborrheic keratosis: Secondary | ICD-10-CM | POA: Diagnosis not present

## 2015-04-20 DIAGNOSIS — D225 Melanocytic nevi of trunk: Secondary | ICD-10-CM | POA: Diagnosis not present

## 2015-04-20 DIAGNOSIS — Z85828 Personal history of other malignant neoplasm of skin: Secondary | ICD-10-CM | POA: Diagnosis not present

## 2015-05-08 ENCOUNTER — Other Ambulatory Visit: Payer: Self-pay | Admitting: Cardiovascular Disease

## 2015-05-23 ENCOUNTER — Other Ambulatory Visit: Payer: Self-pay | Admitting: Cardiovascular Disease

## 2015-05-24 DIAGNOSIS — E782 Mixed hyperlipidemia: Secondary | ICD-10-CM | POA: Diagnosis not present

## 2015-05-24 DIAGNOSIS — I10 Essential (primary) hypertension: Secondary | ICD-10-CM | POA: Diagnosis not present

## 2015-05-29 DIAGNOSIS — I10 Essential (primary) hypertension: Secondary | ICD-10-CM | POA: Diagnosis not present

## 2015-05-29 DIAGNOSIS — E039 Hypothyroidism, unspecified: Secondary | ICD-10-CM | POA: Diagnosis not present

## 2015-05-29 DIAGNOSIS — E782 Mixed hyperlipidemia: Secondary | ICD-10-CM | POA: Diagnosis not present

## 2015-07-18 DIAGNOSIS — Z1231 Encounter for screening mammogram for malignant neoplasm of breast: Secondary | ICD-10-CM | POA: Diagnosis not present

## 2015-11-03 ENCOUNTER — Other Ambulatory Visit: Payer: Self-pay | Admitting: Cardiovascular Disease

## 2015-11-20 DIAGNOSIS — D225 Melanocytic nevi of trunk: Secondary | ICD-10-CM | POA: Diagnosis not present

## 2015-11-20 DIAGNOSIS — Z85828 Personal history of other malignant neoplasm of skin: Secondary | ICD-10-CM | POA: Diagnosis not present

## 2015-11-20 DIAGNOSIS — L814 Other melanin hyperpigmentation: Secondary | ICD-10-CM | POA: Diagnosis not present

## 2015-11-20 DIAGNOSIS — L821 Other seborrheic keratosis: Secondary | ICD-10-CM | POA: Diagnosis not present

## 2015-11-20 DIAGNOSIS — D1801 Hemangioma of skin and subcutaneous tissue: Secondary | ICD-10-CM | POA: Diagnosis not present

## 2015-11-26 DIAGNOSIS — H9202 Otalgia, left ear: Secondary | ICD-10-CM | POA: Diagnosis not present

## 2015-12-17 ENCOUNTER — Encounter (INDEPENDENT_AMBULATORY_CARE_PROVIDER_SITE_OTHER): Payer: Self-pay

## 2015-12-17 ENCOUNTER — Ambulatory Visit (INDEPENDENT_AMBULATORY_CARE_PROVIDER_SITE_OTHER): Payer: Medicare Other | Admitting: Cardiovascular Disease

## 2015-12-17 ENCOUNTER — Encounter: Payer: Self-pay | Admitting: Cardiovascular Disease

## 2015-12-17 VITALS — BP 120/60 | HR 83 | Ht 62.0 in | Wt 156.0 lb

## 2015-12-17 DIAGNOSIS — I447 Left bundle-branch block, unspecified: Secondary | ICD-10-CM | POA: Diagnosis not present

## 2015-12-17 DIAGNOSIS — I5032 Chronic diastolic (congestive) heart failure: Secondary | ICD-10-CM | POA: Diagnosis not present

## 2015-12-17 MED ORDER — FUROSEMIDE 20 MG PO TABS: 20.0000 mg | ORAL_TABLET | Freq: Every day | ORAL | Status: DC

## 2015-12-17 NOTE — Patient Instructions (Signed)
Medication Instructions:  Your physician recommends that you continue on your current medications as directed. Please refer to the Current Medication list given to you today.   Labwork: None Ordered   Testing/Procedures: Your physician has requested that you have an echocardiogram. Echocardiography is a painless test that uses sound waves to create images of your heart. It provides your doctor with information about the size and shape of your heart and how well your heart's chambers and valves are working. This procedure takes approximately one hour. There are no restrictions for this procedure.   Follow-Up: Your physician wants you to follow-up in: 1 year with Dr. Nahser. You will receive a reminder letter in the mail two months in advance. If you don't receive a letter, please call our office to schedule the follow-up appointment.   If you need a refill on your cardiac medications before your next appointment, please call your pharmacy.   Thank you for choosing [facility]! Judithe Keetch, RN [phone]    

## 2015-12-17 NOTE — Progress Notes (Signed)
Kristin Moon Date of Birth  11/21/1945 [facility] [address]    [place], [place]  [address] [phone]  Fax  [phone]   Problem List 1. Diastolic dysfunction. 2. intermittant LBBB - cath in 2012 - minimal CAD    History of Present Illness:  70 year old female with a history of diastolic dysfunction. She has done well since I last saw her. She has not had any episodes of chest pain or shortness of breath.     December 02, 2012:  Kristin Moon is doing well.  She had some extra dyspnea in April, 2014.  She took and extra lasix and felt better.   She is getting some regular exercise.  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   December 14, 2014:  Kristin Moon is doing very well. I saw her last year. She increased her Lasix for about a month but did not feel any different. She resumed her previous Lasix dose and now is feeling quite well.  She denies any chest pain or short of breath. She is able to do her normal activities. 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   December 17, 2015: Doing well from a cardiac standpoint She does have some DOE     Current Outpatient Prescriptions on File Prior to Visit  Medication Sig Dispense Refill  . aspirin EC 81 MG tablet Take 1 tablet (81 mg total) by mouth daily.    . carvedilol (COREG) 6.25 MG tablet TAKE 1 TABLET BY MOUTH 2 TIMES DAILY WITH A MEAL. 180 tablet 3  . Cholecalciferol (VITAMIN D3) 2000 UNITS TABS Take 1 tablet by mouth daily.     Marland Kitchen estrogens conjugated, synthetic A, (CENESTIN) 0.625 MG tablet Take 0.625 mg by mouth daily.      . fluticasone (FLONASE) 50 MCG/ACT nasal spray Place 1 spray into both nostrils as needed for allergies or rhinitis.    . furosemide (LASIX) 20 MG tablet TAKE 1 TABLET (20 MG TOTAL) BY MOUTH 2 (TWO) TIMES DAILY. 180 tablet 3  . KLOR-CON M20 20 MEQ tablet TAKE 1 TABLET (20 MEQ TOTAL) BY MOUTH 2 (TWO) TIMES DAILY. 180 tablet 3  . levothyroxine (SYNTHROID, LEVOTHROID) 75 MCG tablet Take 75  mcg by mouth daily.  3  . lisinopril (PRINIVIL,ZESTRIL) 2.5 MG tablet TAKE 1 TABLET (2.5 MG TOTAL) BY MOUTH DAILY. 90 tablet 0  . lovastatin (MEVACOR) 40 MG tablet Take 40 mg by mouth daily.    . Multiple Vitamin (MULTIVITAMIN) capsule Take 1 capsule by mouth daily.    Marland Kitchen omeprazole (PRILOSEC OTC) 20 MG tablet Take 20 mg by mouth daily.       No current facility-administered medications on file prior to visit.    Allergies  Allergen Reactions  . Metoprolol     fatigue    Past Medical History  Diagnosis Date  . Hypertension   . Non Hodgkin's lymphoma ([place])     history  . Hyperlipidemia   . Hypothyroidism   . Diastolic heart failure     Grade 1 per echo May 2012  . Transaminitis   . S/P right and left heart catheterization May 2012    minimal CAD. EF of 50%  . Left bundle branch block   . Non Hodgkin's lymphoma [facility])     Past Surgical History  Procedure Laterality Date  . Cardiac catheterization  10/17/2010     Left ventriculography shows mild global left ventricular hypokinesisThe LVEF is estimated at 50%.  Marland Kitchen  Abdominal hysterectomy  1983    History  Smoking status  . Never Smoker   Smokeless tobacco  . Never Used    History  Alcohol Use No    Family History  Problem Relation Age of Onset  . Heart attack Father 34  . Hypertension Father   . Heart disease Father   . Hypertension Mother     Reviw of Systems:  Reviewed in the HPI.  All other systems are negative.  Physical Exam: BP 120/60 mmHg  Pulse 83  Ht 5\' 2"  (1.575 m)  Wt 156 lb (70.761 kg)  BMI 28.53 kg/m2  SpO2 99% The patient is alert and oriented x 3.  The mood and affect are normal.   Skin: warm and dry.  Color is normal.    HEENT:   the sclera are nonicteric.  The mucous membranes are moist.  The carotids are 2+ without bruits.  There is no thyromegaly.  There is no JVD.    Lungs: clear.  The chest wall is non tender.    Heart: regular rate with a normal S1 and S2.  There are no murmurs,  gallops, or rubs. The PMI is not displaced.     Abdomen: good bowel sounds.  There is no guarding or rebound.  There is no hepatosplenomegaly or tenderness.  There are no masses.   Extremities:  no clubbing, cyanosis, or edema.  The legs are without rashes.  The distal pulses are intact.   Neuro:  Cranial nerves II - XII are intact.  Motor and sensory functions are intact.    The gait is normal.  ECG: December 14, 2015:  NSR, LBBB , HR is 80   Assessment and Plan   1. Diastolic dysfunction. - doing well.  No changes in medications.  Will see her in 1 year.   Will get an echo reassessment of her LV function given her LBBB.    2.  LBBB - stable   3. Dyspnea:   Has dyspnea - stable,  Unclear etiology   [name], MD  12/17/2015 8:55 AM    [facility] [place],  [place], [place]  [address] Pager [phone] Phone: [phone]; Fax: [phone]   [facility]  [address] [place], [place]  [address] [phone]    Fax [phone]

## 2015-12-18 DIAGNOSIS — R748 Abnormal levels of other serum enzymes: Secondary | ICD-10-CM | POA: Diagnosis not present

## 2015-12-18 DIAGNOSIS — I10 Essential (primary) hypertension: Secondary | ICD-10-CM | POA: Diagnosis not present

## 2015-12-18 DIAGNOSIS — Z0000 Encounter for general adult medical examination without abnormal findings: Secondary | ICD-10-CM | POA: Diagnosis not present

## 2015-12-18 DIAGNOSIS — E039 Hypothyroidism, unspecified: Secondary | ICD-10-CM | POA: Diagnosis not present

## 2015-12-18 DIAGNOSIS — E782 Mixed hyperlipidemia: Secondary | ICD-10-CM | POA: Diagnosis not present

## 2015-12-18 DIAGNOSIS — M810 Age-related osteoporosis without current pathological fracture: Secondary | ICD-10-CM | POA: Diagnosis not present

## 2015-12-25 ENCOUNTER — Other Ambulatory Visit: Payer: Self-pay | Admitting: Cardiovascular Disease

## 2015-12-25 DIAGNOSIS — E039 Hypothyroidism, unspecified: Secondary | ICD-10-CM | POA: Diagnosis not present

## 2015-12-25 DIAGNOSIS — Z23 Encounter for immunization: Secondary | ICD-10-CM | POA: Diagnosis not present

## 2015-12-25 DIAGNOSIS — D509 Iron deficiency anemia, unspecified: Secondary | ICD-10-CM | POA: Diagnosis not present

## 2015-12-25 DIAGNOSIS — R7989 Other specified abnormal findings of blood chemistry: Secondary | ICD-10-CM | POA: Diagnosis not present

## 2015-12-25 DIAGNOSIS — E782 Mixed hyperlipidemia: Secondary | ICD-10-CM | POA: Diagnosis not present

## 2015-12-25 MED ORDER — CARVEDILOL 6.25 MG PO TABS: ORAL_TABLET | ORAL | Status: DC

## 2015-12-31 ENCOUNTER — Encounter (INDEPENDENT_AMBULATORY_CARE_PROVIDER_SITE_OTHER): Payer: Self-pay

## 2015-12-31 ENCOUNTER — Ambulatory Visit (HOSPITAL_COMMUNITY): Payer: Medicare Other | Attending: Internal Medicine

## 2015-12-31 ENCOUNTER — Other Ambulatory Visit (HOSPITAL_COMMUNITY): Payer: Self-pay

## 2015-12-31 DIAGNOSIS — I509 Heart failure, unspecified: Secondary | ICD-10-CM | POA: Diagnosis present

## 2015-12-31 DIAGNOSIS — E785 Hyperlipidemia, unspecified: Secondary | ICD-10-CM | POA: Insufficient documentation

## 2015-12-31 DIAGNOSIS — I351 Nonrheumatic aortic (valve) insufficiency: Secondary | ICD-10-CM | POA: Diagnosis not present

## 2015-12-31 DIAGNOSIS — I340 Nonrheumatic mitral (valve) insufficiency: Secondary | ICD-10-CM | POA: Diagnosis not present

## 2015-12-31 DIAGNOSIS — I5032 Chronic diastolic (congestive) heart failure: Secondary | ICD-10-CM | POA: Insufficient documentation

## 2016-01-03 DIAGNOSIS — N390 Urinary tract infection, site not specified: Secondary | ICD-10-CM | POA: Diagnosis not present

## 2016-01-03 DIAGNOSIS — R3915 Urgency of urination: Secondary | ICD-10-CM | POA: Diagnosis not present

## 2016-02-15 ENCOUNTER — Other Ambulatory Visit: Payer: Self-pay | Admitting: Cardiovascular Disease

## 2016-03-13 DIAGNOSIS — R7989 Other specified abnormal findings of blood chemistry: Secondary | ICD-10-CM | POA: Diagnosis not present

## 2016-03-13 DIAGNOSIS — D509 Iron deficiency anemia, unspecified: Secondary | ICD-10-CM | POA: Diagnosis not present

## 2016-03-21 DIAGNOSIS — Z23 Encounter for immunization: Secondary | ICD-10-CM | POA: Diagnosis not present

## 2016-03-21 DIAGNOSIS — E782 Mixed hyperlipidemia: Secondary | ICD-10-CM | POA: Diagnosis not present

## 2016-03-21 DIAGNOSIS — M810 Age-related osteoporosis without current pathological fracture: Secondary | ICD-10-CM | POA: Diagnosis not present

## 2016-03-21 DIAGNOSIS — E039 Hypothyroidism, unspecified: Secondary | ICD-10-CM | POA: Diagnosis not present

## 2016-03-21 DIAGNOSIS — I10 Essential (primary) hypertension: Secondary | ICD-10-CM | POA: Diagnosis not present

## 2016-04-02 DIAGNOSIS — H35033 Hypertensive retinopathy, bilateral: Secondary | ICD-10-CM | POA: Diagnosis not present

## 2016-04-02 DIAGNOSIS — H2513 Age-related nuclear cataract, bilateral: Secondary | ICD-10-CM | POA: Diagnosis not present

## 2016-04-02 DIAGNOSIS — H43813 Vitreous degeneration, bilateral: Secondary | ICD-10-CM | POA: Diagnosis not present

## 2016-04-02 DIAGNOSIS — H25013 Cortical age-related cataract, bilateral: Secondary | ICD-10-CM | POA: Diagnosis not present

## 2016-07-22 DIAGNOSIS — Z1231 Encounter for screening mammogram for malignant neoplasm of breast: Secondary | ICD-10-CM | POA: Diagnosis not present

## 2016-08-14 ENCOUNTER — Other Ambulatory Visit: Payer: Self-pay | Admitting: Cardiovascular Disease

## 2016-08-15 NOTE — Telephone Encounter (Signed)
CALLED TO CONFIRM LASIX DOSE WITH PT, WE HAVE LASIX 20 MG PO QD AND PHARMACY REQUEST OF LASIX 20 MG BID, CONFIRMED W/Kristin Moon THAT SHE IS ONLY TAKING IT ONCE A DAY AND DID NOT WANT [place] TO REFILL AS SHE HAS SEVERAL BOTTLES ON HAND.

## 2016-09-10 ENCOUNTER — Other Ambulatory Visit: Payer: Self-pay | Admitting: Cardiovascular Disease

## 2016-09-10 MED ORDER — FUROSEMIDE 20 MG PO TABS: 20.0000 mg | ORAL_TABLET | Freq: Every day | ORAL | 0 refills | Status: DC

## 2016-09-10 MED ORDER — POTASSIUM CHLORIDE CRYS ER 20 MEQ PO TBCR: EXTENDED_RELEASE_TABLET | ORAL | 0 refills | Status: DC

## 2016-09-18 ENCOUNTER — Other Ambulatory Visit: Payer: Self-pay | Admitting: *Deleted

## 2016-09-18 MED ORDER — LISINOPRIL 2.5 MG PO TABS: ORAL_TABLET | ORAL | 1 refills | Status: AC

## 2016-10-10 DIAGNOSIS — E039 Hypothyroidism, unspecified: Secondary | ICD-10-CM | POA: Diagnosis not present

## 2016-10-10 DIAGNOSIS — E782 Mixed hyperlipidemia: Secondary | ICD-10-CM | POA: Diagnosis not present

## 2016-10-17 DIAGNOSIS — E039 Hypothyroidism, unspecified: Secondary | ICD-10-CM | POA: Diagnosis not present

## 2016-10-17 DIAGNOSIS — E782 Mixed hyperlipidemia: Secondary | ICD-10-CM | POA: Diagnosis not present

## 2016-10-17 DIAGNOSIS — I10 Essential (primary) hypertension: Secondary | ICD-10-CM | POA: Diagnosis not present

## 2016-10-17 DIAGNOSIS — R945 Abnormal results of liver function studies: Secondary | ICD-10-CM | POA: Diagnosis not present

## 2016-11-20 DIAGNOSIS — L821 Other seborrheic keratosis: Secondary | ICD-10-CM | POA: Diagnosis not present

## 2016-11-20 DIAGNOSIS — D225 Melanocytic nevi of trunk: Secondary | ICD-10-CM | POA: Diagnosis not present

## 2016-11-20 DIAGNOSIS — Z85828 Personal history of other malignant neoplasm of skin: Secondary | ICD-10-CM | POA: Diagnosis not present

## 2016-11-20 DIAGNOSIS — L814 Other melanin hyperpigmentation: Secondary | ICD-10-CM | POA: Diagnosis not present

## 2016-11-20 DIAGNOSIS — L820 Inflamed seborrheic keratosis: Secondary | ICD-10-CM | POA: Diagnosis not present

## 2016-12-10 ENCOUNTER — Other Ambulatory Visit: Payer: Self-pay | Admitting: Cardiovascular Disease

## 2016-12-18 DIAGNOSIS — Z1211 Encounter for screening for malignant neoplasm of colon: Secondary | ICD-10-CM | POA: Diagnosis not present

## 2016-12-18 DIAGNOSIS — K219 Gastro-esophageal reflux disease without esophagitis: Secondary | ICD-10-CM | POA: Diagnosis not present

## 2016-12-18 DIAGNOSIS — Z86010 Personal history of colonic polyps: Secondary | ICD-10-CM | POA: Diagnosis not present

## 2016-12-31 ENCOUNTER — Telehealth (INDEPENDENT_AMBULATORY_CARE_PROVIDER_SITE_OTHER): Payer: Self-pay | Admitting: Family

## 2016-12-31 DIAGNOSIS — M5416 Radiculopathy, lumbar region: Secondary | ICD-10-CM

## 2016-12-31 NOTE — Telephone Encounter (Signed)
Patient called. Has been having issues with lumbar radicular pain again. No red flag symptoms. Has had ESI with [facility] in past which worked well. Requesting appointment with Ernestina Patches.

## 2017-01-14 ENCOUNTER — Ambulatory Visit (INDEPENDENT_AMBULATORY_CARE_PROVIDER_SITE_OTHER): Payer: Medicare Other

## 2017-01-14 ENCOUNTER — Ambulatory Visit (INDEPENDENT_AMBULATORY_CARE_PROVIDER_SITE_OTHER): Payer: Medicare Other | Admitting: Physical Medicine and Rehabilitation

## 2017-01-14 ENCOUNTER — Encounter (INDEPENDENT_AMBULATORY_CARE_PROVIDER_SITE_OTHER): Payer: Self-pay | Admitting: Physical Medicine and Rehabilitation

## 2017-01-14 VITALS — BP 142/51

## 2017-01-14 DIAGNOSIS — G8929 Other chronic pain: Secondary | ICD-10-CM

## 2017-01-14 DIAGNOSIS — M47816 Spondylosis without myelopathy or radiculopathy, lumbar region: Secondary | ICD-10-CM

## 2017-01-14 DIAGNOSIS — M545 Low back pain: Secondary | ICD-10-CM | POA: Diagnosis not present

## 2017-01-14 MED ORDER — MELOXICAM 15 MG PO TABS: 15.0000 mg | ORAL_TABLET | Freq: Every day | ORAL | 1 refills | Status: DC

## 2017-01-14 NOTE — Progress Notes (Deleted)
Pain center of low back off and on for several months. Pain with standing any length of time. No pain with sitting or walking. No leg pain. Says she feels like when she is walking sometimes that her legs are pushing up into her hips. Takes ibuprofen as needed for pain.

## 2017-01-16 ENCOUNTER — Encounter (INDEPENDENT_AMBULATORY_CARE_PROVIDER_SITE_OTHER): Payer: Self-pay | Admitting: Physical Medicine and Rehabilitation

## 2017-01-16 NOTE — Progress Notes (Signed)
Kristin Moon - 71 y.o. female MRN [id number]  Date of birth: [birth year]  Office Visit Note: Visit Date: 01/14/2017 PCP: Merrilee Seashore, MD Referred by: Merrilee Seashore, MD  Subjective: Chief Complaint  Patient presents with  . Lower Back - Pain   HPI: Kristin Moon is a very pleasant 71 year old female who was originally a patient of Dr. Sharol Given is in our office. She has not been seen in the office since 2010. At the point that I saw her she had an MRI of the lumbar spine which is reviewed below. She was having more left hip and leg and thigh pain at the time which was more radicular. We completed a couple of epidural injections with really good relief of her symptoms. She's been actually doing really well since that time. She does have a history of non-Hodgkin's lymphoma which has been in remission and is essentially cured at this point. She reports several weeks of just ongoing axial low back pain. Worse in the morning and worse with standing. She reports a gradual increase over months but then several weeks of severe pain. She reports that she can't really stand for any length of time trying to work in the kitchen or just do anything. She doesn't have much pain with sitting and she is actually able walk fairly well without leg pain or thigh pain or heaviness. She does feel like sometimes when she is walking she gets a sensation that her legs are pushing up into her hips. She does take ibuprofen as needed. She does take 2 at a time. She does not do this regularly. She does not like taking medications. She has had therapy in the remote past but not recently. She's had no chiropractic care. She reports pain with standing and twisting. Her pain is quite severe as what brings her in today. She denies any numbness tingling or paresthesias. She's had no fevers chills or night sweats. She's had no focal trauma. She's had no weakness. She's had no unexplained weight loss.    Review of Systems    Constitutional: Negative for chills, fever, malaise/fatigue and weight loss.  HENT: Negative for hearing loss and sinus pain.   Eyes: Negative for blurred vision, double vision and photophobia.  Respiratory: Negative for cough and shortness of breath.   Cardiovascular: Negative for chest pain, palpitations and leg swelling.  Gastrointestinal: Negative for abdominal pain, nausea and vomiting.  Genitourinary: Negative for flank pain.  Musculoskeletal: Positive for back pain. Negative for myalgias.  Skin: Negative for itching and rash.  Neurological: Negative for tremors, focal weakness and weakness.  Endo/Heme/Allergies: Negative.   Psychiatric/Behavioral: Negative for depression.  All other systems reviewed and are negative.  Otherwise per HPI.  Assessment & Plan: Visit Diagnoses:  1. Spondylosis without myelopathy or radiculopathy, lumbar region   2. Chronic bilateral low back pain without sciatica     Plan: Findings:  Chronic several month history with recent worsening to severe low back pain axial worse with standing more than walking. She gets significant relief with sitting. She can't stand for any length of time to do a lot of the things that she would like to do. She is an avid walker and likes to exercise. She has not had any trauma. She's had had a red flag symptoms although she does have a history remotely of non-Hodgkin's lymphoma. I think her symptoms really are facet mediated pain given the x-ray findings of now small grade 1 listhesis of L4 on L5 and  L5 on S1. She could have some level of stenosis but really her symptoms seem to be facet mediated. I think at this point it would be wise to complete diagnostic facet joint blocks at L4-5 and L5-S1 if she doesn't get relief with the trial of anti-inflammatories for the next few weeks.. If she gets a lot of relief with the potential injection we would just watch her. We discussed the use of anti-inflammatories and the best way to use  those in periods of several weeks at a time and it would not hurt her. I did prescribe meloxicam to take daily for the next 3 weeks. She does not have any other significant disease processes or contraindications to using some anti-inflammatories. If her symptoms are helped with the injections diagnostically but did not last long we'll regroup with physical therapy and maybe consider updated MRI imaging versus radiofrequency ablation.    Meds & Orders:  Meds ordered this encounter  Medications  . meloxicam (MOBIC) 15 MG tablet    Sig: Take 1 tablet (15 mg total) by mouth daily. Take with food    Dispense:  30 tablet    Refill:  1    Orders Placed This Encounter  Procedures  . XR Lumbar Spine Complete    Follow-up: Return for Lower lumbar facet joint blocks if needed.   Procedures: No procedures performed  No notes on file   Clinical History: MRI LUMBAR SPINE WITHOUT CONTRAST 08/03/2008   Technique: Multiplanar and multiecho pulse sequences of the lumbar spine were obtained without intravenous contrast.   Comparison: None   Findings: Desiccation of the L4-L5 and L5-S1 disc. Shallow protrusion at L4-L5 with annular tear. No encroachment on the exiting nerve roots is appreciated on the parasagittal images.   The conus terminates at T12-L1 which is normal appearance. The paravertebral structures are unremarkable.   Axial imaging: The individual disc spaces are examined as follows:     L2-3: Mild facet degenerative changes.   L3-4: A disc bulge eccentric left. No encroachment on the exiting L3 root. No central stenosis. Mild facet degenerative changes.   L4-5: Disc bulge. Shallow central protrusion with annular tear. No mass effect on the exiting nerve roots. Mild facet degenerative changes.   L5-S1: Facet degenerative changes bilaterally without central or lateral recess stenosis.   IMPRESSION: Mild degenerative disc disease as described above most notably at the L4-L5  level.   Facet degenerative changes at L4-L5 and L5-S1. I question whether the patients symptoms may be facet mediated.  She reports that she has never smoked. She has never used smokeless tobacco. No results for input(s): HGBA1C, LABURIC in the last 8760 hours.  Objective:  VS:  HT:    WT:   BMI:     BP:(!) 142/51  HR: bpm  TEMP: ( )  RESP:  Physical Exam  Constitutional: She is oriented to person, place, and time. She appears well-developed and well-nourished. No distress.  HENT:  Head: Normocephalic and atraumatic.  Nose: Nose normal.  Mouth/Throat: Oropharynx is clear and moist.  Eyes: Pupils are equal, round, and reactive to light. Conjunctivae are normal.  Neck: Normal range of motion. Neck supple.  Cardiovascular: Regular rhythm and intact distal pulses.   Pulmonary/Chest: Effort normal. No respiratory distress.  Abdominal: She exhibits no distension. There is no guarding.  Musculoskeletal:  Patient is somewhat slow to rise from a seated position. She has concordant pain with extension rotation of the lumbar spine. She has no real active trigger  points but does have some tenderness in the paraspinal musculature. She has no pain over the greater trochanters and no pain with hip rotation internal or external. She has good distal strength without clonus.  Neurological: She is alert and oriented to person, place, and time. She exhibits normal muscle tone. Coordination normal.  Skin: Skin is warm. No rash noted. No erythema.  Psychiatric: She has a normal mood and affect. Her behavior is normal.  Nursing note and vitals reviewed.   Ortho Exam Imaging: No results found.  Past Medical/Family/Surgical/Social History: Medications & Allergies reviewed per EMR Patient Active Problem List   Diagnosis Date Noted  . LBBB (left bundle branch block) 12/02/2012  . Chronic diastolic heart failure ([place])   . Transaminitis   . Non Hodgkin's lymphoma ([place])   . Hyperlipidemia   . SOB  (shortness of breath)   . Hyperthyroidism    Past Medical History:  Diagnosis Date  . Diastolic heart failure    Grade 1 per echo May 2012  . Hyperlipidemia   . Hypertension   . Hypothyroidism   . Left bundle branch block   . Non Hodgkin's lymphoma ([place])    history  . Non Hodgkin's lymphoma ([place])   . S/P right and left heart catheterization May 2012   minimal CAD. EF of 50%  . Transaminitis    Family History  Problem Relation Age of Onset  . Heart attack Father 90  . Hypertension Father   . Heart disease Father   . Hypertension Mother    Past Surgical History:  Procedure Laterality Date  . ABDOMINAL HYSTERECTOMY  1983  . CARDIAC CATHETERIZATION  10/17/2010    Left ventriculography shows mild global left ventricular hypokinesisThe LVEF is estimated at 50%.   Social History   Occupational History  . 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 Smoking status: Never Smoker  . Smokeless tobacco: Never Used  . Alcohol use No  . Drug use: No  . Sexual activity: Not on file

## 2017-01-19 ENCOUNTER — Telehealth (INDEPENDENT_AMBULATORY_CARE_PROVIDER_SITE_OTHER): Payer: Self-pay | Admitting: *Deleted

## 2017-01-19 NOTE — Telephone Encounter (Signed)
Please have her stop meloxicam, it is a very uncommon result to have decreased urine output - long term changes in kidney function could be seen, we were only going to do for 2 to 3 weeks anyway.  She should stay hydration and monitor urine output today if not much then she needs to go to ED for eval.  If urine picks up we can see her in OV

## 2017-01-19 NOTE — Telephone Encounter (Signed)
Pt called saw Dr. Ernestina Patches on Wednesday last week was given Meloxicam 15mg , states she had taken it up to yesterday am and is having issues with her kidney function not able to let out urine, has had some diarrhea but has subsided sometime this am, she did not take medication this am and has had some urination. Wants to know what needs to be done. Please advise.  [facility] # [phone]  Pharmacy- [facility] Dr. Arlean Hopping

## 2017-01-19 NOTE — Telephone Encounter (Signed)
Please advise 

## 2017-01-20 DIAGNOSIS — N390 Urinary tract infection, site not specified: Secondary | ICD-10-CM | POA: Diagnosis not present

## 2017-01-20 DIAGNOSIS — R350 Frequency of micturition: Secondary | ICD-10-CM | POA: Diagnosis not present

## 2017-01-20 NOTE — Telephone Encounter (Signed)
Patient advised per [place].

## 2017-01-29 ENCOUNTER — Encounter (INDEPENDENT_AMBULATORY_CARE_PROVIDER_SITE_OTHER): Payer: Self-pay | Admitting: Physical Medicine and Rehabilitation

## 2017-01-29 ENCOUNTER — Ambulatory Visit (INDEPENDENT_AMBULATORY_CARE_PROVIDER_SITE_OTHER): Payer: Medicare Other

## 2017-01-29 ENCOUNTER — Ambulatory Visit (INDEPENDENT_AMBULATORY_CARE_PROVIDER_SITE_OTHER): Payer: Medicare Other | Admitting: Physical Medicine and Rehabilitation

## 2017-01-29 VITALS — BP 140/53 | HR 83 | Temp 97.8°F

## 2017-01-29 DIAGNOSIS — M47816 Spondylosis without myelopathy or radiculopathy, lumbar region: Secondary | ICD-10-CM

## 2017-01-29 MED ORDER — LIDOCAINE HCL (PF) 1 % IJ SOLN
2.0000 mL | Freq: Once | INTRAMUSCULAR | Status: AC
  Administered 2017-01-29: 2 mL

## 2017-01-29 MED ORDER — METHYLPREDNISOLONE ACETATE 80 MG/ML IJ SUSP
80.0000 mg | Freq: Once | INTRAMUSCULAR | Status: AC
  Administered 2017-01-29: 80 mg

## 2017-01-29 NOTE — Progress Notes (Signed)
Patient is here for planned bilateral L4-5 and L5-S1 facet injection. No change in symptoms.

## 2017-01-29 NOTE — Patient Instructions (Signed)

## 2017-02-03 NOTE — Procedures (Signed)
Unfortunately she had an episode where she felt like she is having difficulty urinating after taking the meloxicam. She has stopped meloxicam and this seems to prove. She did have a urinalysis that did not show any sign of infection but she was actually treated for urinary tract infection as well. As per our prior notes we're going to complete diagnostic and therapeutic facet joint blocks.  Please see our prior evaluation and management note for further details and justification.  Lumbar Facet Joint Intra-Articular Injection(s) with Fluoroscopic Guidance  Patient: Kristin Moon      Date of Birth: 1945-07-15 MRN: [id number] PCP: Merrilee Seashore, MD      Visit Date: 01/29/2017   Universal Protocol:    Date/Time: 01/29/2017  Consent Given By: the patient  Position: PRONE   Additional Comments: Vital signs were monitored before and after the procedure. Patient was prepped and draped in the usual sterile fashion. The correct patient, procedure, and site was verified.   Injection Procedure Details:  Procedure Site One Meds Administered:  Meds ordered this encounter  Medications  . lidocaine (PF) (XYLOCAINE) 1 % injection 2 mL  . methylPREDNISolone acetate (DEPO-MEDROL) injection 80 mg     Laterality: Bilateral  Location/Site:  L4-L5 L5-S1  Needle size: 22 guage  Needle type: Spinal  Needle Placement: Articular  Findings:  -Contrast Used: 1 mL iohexol 180 mg iodine/mL   -Comments: Excellent flow of contrast producing a partial arthrogram.  Procedure Details: The fluoroscope beam is vertically oriented in AP, and the inferior recess is visualized beneath the lower pole of the inferior apophyseal process, which represents the target point for needle insertion. When direct visualization is difficult the target point is located at the medial projection of the vertebral pedicle. The region overlying each aforementioned target is locally anesthetized with a 1 to 2 ml. volume  of 1% Lidocaine without Epinephrine.   The spinal needle was inserted into each of the above mentioned facet joints using biplanar fluoroscopic guidance. A 0.25 to 0.5 ml. volume of Isovue-250 was injected and a partial facet joint arthrogram was obtained. A single spot film was obtained of the resulting arthrogram.    One to 1.25 ml of the steroid/anesthetic solution was then injected into each of the facet joints noted above.   Additional Comments:  No complications occurred Dressing: Band-Aid    Post-procedure details: Patient was observed during the procedure. Post-procedure instructions were reviewed.  Patient left the clinic in stable condition.

## 2017-02-06 ENCOUNTER — Ambulatory Visit (INDEPENDENT_AMBULATORY_CARE_PROVIDER_SITE_OTHER): Payer: Medicare Other | Admitting: Cardiovascular Disease

## 2017-02-06 ENCOUNTER — Encounter: Payer: Self-pay | Admitting: Cardiovascular Disease

## 2017-02-06 VITALS — BP 120/68 | HR 79 | Ht 62.0 in | Wt 166.4 lb

## 2017-02-06 DIAGNOSIS — E782 Mixed hyperlipidemia: Secondary | ICD-10-CM

## 2017-02-06 DIAGNOSIS — I447 Left bundle-branch block, unspecified: Secondary | ICD-10-CM | POA: Diagnosis not present

## 2017-02-06 DIAGNOSIS — I5032 Chronic diastolic (congestive) heart failure: Secondary | ICD-10-CM

## 2017-02-06 NOTE — Progress Notes (Signed)
Kristin Moon Date of Birth  Nov 18, 1945 [facility] [address]    [place],   [address] [phone]  Fax  [phone]   Problem List 1. Diastolic dysfunction. 2. intermittant LBBB - cath in 2012 - minimal CAD    History of Present Illness:  71 year old female with a history of diastolic dysfunction. She has done well since I last saw her. She has not had any episodes of chest pain or shortness of breath.     December 02, 2012:  Kristin Moon is doing well.  She had some extra dyspnea in April, 2014.  She took and extra lasix and felt better.   She is getting some regular exercise.  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   December 14, 2014:  Kristin Moon is doing very well. I saw her last year. She increased her Lasix for about a month but did not feel any different. She resumed her previous Lasix dose and now is feeling quite well.  She denies any chest pain or short of breath. She is able to do her normal activities. 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   December 17, 2015: Doing well from a cardiac standpoint She does have some DOE   Aug. 31, 2018: Kristin Moon is seen today for follow-up of her diastolic dysfunction. 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   Rare episodes of DOE   Current Outpatient Prescriptions on File Prior to Visit  Medication Sig Dispense Refill  . aspirin EC 81 MG tablet Take 1 tablet (81 mg total) by mouth daily.    . carvedilol (COREG) 6.25 MG tablet TAKE 1 TABLET BY MOUTH 2 TIMES DAILY WITH A MEAL. 180 tablet 0  . Cholecalciferol (VITAMIN D3) 2000 UNITS TABS Take 1 tablet by mouth daily.     Marland Kitchen estrogens conjugated, synthetic A, (CENESTIN) 0.625 MG tablet Take 0.625 mg by mouth daily.      . fluticasone (FLONASE) 50 MCG/ACT nasal spray Place 1 spray into both nostrils as needed for allergies or rhinitis.    . furosemide (LASIX) 20 MG tablet Take 1 tablet (20 mg total) by mouth daily. 90  tablet 0  . levothyroxine (SYNTHROID, LEVOTHROID) 75 MCG tablet Take 75 mcg by mouth daily.  3  . lisinopril (PRINIVIL,ZESTRIL) 2.5 MG tablet TAKE 1 TABLET (2.5 MG TOTAL) BY MOUTH DAILY. 90 tablet 1  . lovastatin (MEVACOR) 40 MG tablet Take 40 mg by mouth daily.    . meloxicam (MOBIC) 15 MG tablet Take 1 tablet (15 mg total) by mouth daily. Take with food 30 tablet 1  . omeprazole (PRILOSEC OTC) 20 MG tablet Take 20 mg by mouth daily.      . potassium chloride SA (KLOR-CON M20) 20 MEQ tablet TAKE 1 TABLET (20 MEQ TOTAL) BY MOUTH 2 (TWO) TIMES DAILY. 180 tablet 0   No current facility-administered medications on file prior to visit.     Allergies  Allergen Reactions  . Metoprolol     fatigue    Past Medical History:  Diagnosis Date  . Diastolic heart failure    Grade 1 per echo May 2012  . Hyperlipidemia   . Hypertension   . Hypothyroidism   . Left bundle branch block   . Non Hodgkin's lymphoma ([place])    history  . Non Hodgkin's lymphoma ([place])   . S/P right and left heart catheterization May 2012   minimal CAD. EF  of 50%  . Transaminitis     Past Surgical History:  Procedure Laterality Date  . ABDOMINAL HYSTERECTOMY  1983  . CARDIAC CATHETERIZATION  10/17/2010    Left ventriculography shows mild global left ventricular hypokinesisThe LVEF is estimated at 50%.    History  Smoking Status  . Never Smoker  Smokeless Tobacco  . Never Used    History  Alcohol Use No    Family History  Problem Relation Age of Onset  . Heart attack Father 21  . Hypertension Father   . Heart disease Father   . Hypertension Mother     Reviw of Systems:  Reviewed in the HPI.  All other systems are negative.  Physical Exam: BP 120/68   Pulse 79   Ht 5\' 2"  (1.575 m)   Wt 166 lb 6.4 oz (75.5 kg)   SpO2 96%   BMI 30.43 kg/m  The patient is alert and oriented x 3.  The mood and affect are normal.   Skin: warm and dry.  Color is normal.    HEENT:   the sclera are nonicteric.  The  mucous membranes are moist.  The carotids are 2+ without bruits.  There is no thyromegaly.  There is no JVD.    Lungs: Clear to auscultation. The chest wall is non tender.    Heart: Regular rate with normal S1-S2. She has no murmurs.    Abdomen: Normal bowel sounds.There is no hepatosplenomegaly or tenderness.  There are no masses.   Extremities:  no clubbing, cyanosis, or edema.  The legs are without rashes.  The distal pulses are intact.   Neuro:  Cranial nerves II - XII are intact.  Motor and sensory functions are intact.    The gait is normal.  ECG: February 06, 2017: Normal sinus rhythm at 74. Left bundle branch block   Assessment and Plan   1. Diastolic dysfunction. - doing well.  No changes in medications.  Will see her in 1 year.      2.  LBBB - stable   3. Dyspnea:   Has dyspnea - stable,  Unclear etiology   Kristin Moores, MD  02/06/2017 4:23 PM    [address],  [place], [place]  [address] Pager [phone] Phone: [phone]; Fax: [phone]

## 2017-02-06 NOTE — Patient Instructions (Signed)
Medication Instructions:  Your physician recommends that you continue on your current medications as directed. Please refer to the Current Medication list given to you today.   Labwork: None Ordered Please have your medical doctor fax lab results to [phone]   Testing/Procedures: None Ordered   Follow-Up: Your physician wants you to follow-up in: 1 year with Dr. Acie Fredrickson.  You will receive a reminder letter in the mail two months in advance. If you don't receive a letter, please call our office to schedule the follow-up appointment.   If you need a refill on your cardiac medications before your next appointment, please call your pharmacy.   Thank you for choosing [facility]! Christen Bame, RN [phone]

## 2017-04-16 DIAGNOSIS — H2513 Age-related nuclear cataract, bilateral: Secondary | ICD-10-CM | POA: Diagnosis not present

## 2017-04-16 DIAGNOSIS — H25013 Cortical age-related cataract, bilateral: Secondary | ICD-10-CM | POA: Diagnosis not present

## 2017-04-16 DIAGNOSIS — H35033 Hypertensive retinopathy, bilateral: Secondary | ICD-10-CM | POA: Diagnosis not present

## 2017-04-16 DIAGNOSIS — H43813 Vitreous degeneration, bilateral: Secondary | ICD-10-CM | POA: Diagnosis not present

## 2017-04-27 DIAGNOSIS — I10 Essential (primary) hypertension: Secondary | ICD-10-CM | POA: Diagnosis not present

## 2017-04-27 DIAGNOSIS — Z23 Encounter for immunization: Secondary | ICD-10-CM | POA: Diagnosis not present

## 2017-04-27 DIAGNOSIS — E782 Mixed hyperlipidemia: Secondary | ICD-10-CM | POA: Diagnosis not present

## 2017-04-27 DIAGNOSIS — Z0000 Encounter for general adult medical examination without abnormal findings: Secondary | ICD-10-CM | POA: Diagnosis not present

## 2017-04-27 DIAGNOSIS — E039 Hypothyroidism, unspecified: Secondary | ICD-10-CM | POA: Diagnosis not present

## 2017-05-11 DIAGNOSIS — E039 Hypothyroidism, unspecified: Secondary | ICD-10-CM | POA: Diagnosis not present

## 2017-05-11 DIAGNOSIS — R9431 Abnormal electrocardiogram [ECG] [EKG]: Secondary | ICD-10-CM | POA: Diagnosis not present

## 2017-05-11 DIAGNOSIS — I10 Essential (primary) hypertension: Secondary | ICD-10-CM | POA: Diagnosis not present

## 2017-05-11 DIAGNOSIS — E782 Mixed hyperlipidemia: Secondary | ICD-10-CM | POA: Diagnosis not present

## 2017-05-11 DIAGNOSIS — K219 Gastro-esophageal reflux disease without esophagitis: Secondary | ICD-10-CM | POA: Diagnosis not present

## 2017-06-20 ENCOUNTER — Encounter (HOSPITAL_BASED_OUTPATIENT_CLINIC_OR_DEPARTMENT_OTHER): Payer: Self-pay | Admitting: Emergency Medicine

## 2017-06-20 ENCOUNTER — Other Ambulatory Visit: Payer: Self-pay

## 2017-06-20 ENCOUNTER — Emergency Department (HOSPITAL_BASED_OUTPATIENT_CLINIC_OR_DEPARTMENT_OTHER)
Admission: EM | Admit: 2017-06-20 | Discharge: 2017-06-20 | Disposition: A | Discharge status: Home / Self Care | Payer: Medicare Other | Attending: Emergency Medicine | Admitting: Emergency Medicine

## 2017-06-20 DIAGNOSIS — H1032 Unspecified acute conjunctivitis, left eye: Secondary | ICD-10-CM | POA: Diagnosis not present

## 2017-06-20 DIAGNOSIS — I110 Hypertensive heart disease with heart failure: Secondary | ICD-10-CM | POA: Insufficient documentation

## 2017-06-20 DIAGNOSIS — Z7982 Long term (current) use of aspirin: Secondary | ICD-10-CM | POA: Insufficient documentation

## 2017-06-20 DIAGNOSIS — I5032 Chronic diastolic (congestive) heart failure: Secondary | ICD-10-CM | POA: Diagnosis not present

## 2017-06-20 DIAGNOSIS — Z8572 Personal history of non-Hodgkin lymphomas: Secondary | ICD-10-CM | POA: Diagnosis not present

## 2017-06-20 DIAGNOSIS — H5712 Ocular pain, left eye: Secondary | ICD-10-CM | POA: Diagnosis present

## 2017-06-20 DIAGNOSIS — Z79899 Other long term (current) drug therapy: Secondary | ICD-10-CM | POA: Diagnosis not present

## 2017-06-20 MED ORDER — FLUORESCEIN SODIUM 1 MG OP STRP
1.0000 | ORAL_STRIP | Freq: Once | OPHTHALMIC | Status: AC
  Administered 2017-06-20: 1 via OPHTHALMIC
  Filled 2017-06-20: qty 1

## 2017-06-20 MED ORDER — ERYTHROMYCIN 5 MG/GM OP OINT
TOPICAL_OINTMENT | Freq: Once | OPHTHALMIC | Status: AC
  Administered 2017-06-20: 1 via OPHTHALMIC
  Filled 2017-06-20: qty 3.5

## 2017-06-20 MED ORDER — ERYTHROMYCIN 5 MG/GM OP OINT: TOPICAL_OINTMENT | OPHTHALMIC | 0 refills | Status: DC

## 2017-06-20 NOTE — ED Notes (Signed)
ED Provider at bedside. 

## 2017-06-20 NOTE — Discharge Instructions (Signed)
Use erythromycin ointment to L eye twice daily for 5 days.   See your doctor   Return to ER if you have worse eye redness, eyelid swelling, double vision, blurry vision, fever

## 2017-06-20 NOTE — ED Provider Notes (Addendum)
[facility] EMERGENCY DEPARTMENT Provider Note   CSN: [id number] Arrival date & time: 06/20/17  7408     History   Chief Complaint Chief Complaint  Patient presents with  . Eye Pain    HPI Kristin Moon is a 72 y.o. female history of hypertension, hyperlipidemia, non-Hodgkin's lymphoma here presenting with left eye pain and redness.  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  She rubbed her eye and noticed increased redness in the eye.  She felt like something may be in her eye but washed it out but still has some foreign body sensation.  Denies any fevers or chills.  Denies any purulent discharge from the eye.  The history is provided by the patient.    Past Medical History:  Diagnosis Date  . Diastolic heart failure    Grade 1 per echo May 2012  . Hyperlipidemia   . Hypertension   . Hypothyroidism   . Left bundle branch block   . Non Hodgkin's lymphoma ([place])    history  . Non Hodgkin's lymphoma ([place])   . S/P right and left heart catheterization May 2012   minimal CAD. EF of 50%  . Transaminitis     Patient Active Problem List   Diagnosis Date Noted  . LBBB (left bundle branch block) 12/02/2012  . Chronic diastolic CHF (congestive heart failure) ([place])   . Transaminitis   . Non Hodgkin's lymphoma ([place])   . Hyperlipidemia   . SOB (shortness of breath)   . Hyperthyroidism     Past Surgical History:  Procedure Laterality Date  . ABDOMINAL HYSTERECTOMY  1983  . CARDIAC CATHETERIZATION  10/17/2010    Left ventriculography shows mild global left ventricular hypokinesisThe LVEF is estimated at 50%.    OB History    No data available       Home Medications    Prior to Admission medications   Medication Sig Start Date End Date Taking? Authorizing Provider  aspirin EC 81 MG tablet Take 1 tablet (81 mg total) by mouth daily. 12/06/13   Nahser, Wonda Cheng, MD  carvedilol (COREG) 6.25 MG tablet TAKE 1  TABLET BY MOUTH 2 TIMES DAILY WITH A MEAL. 12/11/16   Nahser, Wonda Cheng, MD  Cholecalciferol (VITAMIN D3) 2000 UNITS TABS Take 1 tablet by mouth daily.     [provider]  estrogens conjugated, synthetic A, (CENESTIN) 0.625 MG tablet Take 0.625 mg by mouth daily.      [provider]  fluticasone (FLONASE) 50 MCG/ACT nasal spray Place 1 spray into both nostrils as needed for allergies or rhinitis.    [provider]  furosemide (LASIX) 20 MG tablet Take 1 tablet (20 mg total) by mouth daily. 09/10/16   Nahser, Wonda Cheng, MD  levothyroxine (SYNTHROID, LEVOTHROID) 75 MCG tablet Take 75 mcg by mouth daily. 10/20/14   [provider]  lisinopril (PRINIVIL,ZESTRIL) 2.5 MG tablet TAKE 1 TABLET (2.5 MG TOTAL) BY MOUTH DAILY. 09/18/16   Nahser, Wonda Cheng, MD  lovastatin (MEVACOR) 40 MG tablet Take 40 mg by mouth daily. 12/06/14   [provider]  meloxicam (MOBIC) 15 MG tablet Take 1 tablet (15 mg total) by mouth daily. Take with food 01/14/17   Magnus Sinning, MD  omeprazole (PRILOSEC OTC) 20 MG tablet Take 20 mg by mouth daily.      [provider]  potassium chloride SA (KLOR-CON M20) 20 MEQ tablet TAKE 1 TABLET (  20 MEQ TOTAL) BY MOUTH 2 (TWO) TIMES DAILY. 09/10/16   Nahser, Wonda Cheng, MD    Family History Family History  Problem Relation Age of Onset  . Heart attack Father 92  . Hypertension Father   . Heart disease Father   . Hypertension Mother     Social History Social History   Tobacco Use  . Smoking status: Never Smoker  . Smokeless tobacco: Never Used  Substance Use Topics  . Alcohol use: No  . Drug use: No     Allergies   Metoprolol   Review of Systems Review of Systems  Eyes: Positive for pain.  All other systems reviewed and are negative.    Physical Exam Updated Vital Signs BP (!) 128/46 (BP Location: Left Arm)   Pulse 77   Temp 97.6 F (36.4 C) (Oral)   Resp 18   Ht 5\' 2"  (1.575 m)   Wt 74.8 kg (165 lb)   SpO2 100%    BMI 30.18 kg/m   Physical Exam  Constitutional: She appears well-developed.  HENT:  Head: Normocephalic.  Mouth/Throat: Oropharynx is clear and moist.  Eyes: EOM are normal.  L conjunctiva slightly red. No obvious purulent discharge. No obvious eyelid swelling. No foreign body under flourescein stain. No foreign body under the eyelids   Neck: Normal range of motion.  Cardiovascular: Normal rate, regular rhythm and normal heart sounds.  Pulmonary/Chest: Effort normal.  Abdominal: Soft.  Musculoskeletal: Normal range of motion.  Skin: Skin is warm.  Psychiatric: She has a normal mood and affect.  Nursing note and vitals reviewed.    ED Treatments / Results  Labs (all labs ordered are listed, but only abnormal results are displayed) Labs Reviewed - No data to display  EKG  EKG Interpretation None       Radiology No results found.  Procedures Procedures (including critical care time)  Medications Ordered in ED Medications  erythromycin ophthalmic ointment (not administered)  fluorescein ophthalmic strip 1 strip (1 strip Left Eye Given 06/20/17 0932)     Initial Impression / Assessment and Plan / ED Course  I have reviewed the triage vital signs and the nursing notes.  Pertinent labs & imaging results that were available during my care of the patient were reviewed by me and considered in my medical decision making (see chart for details).     Kristin Moon is a 72 y.o. female here with l eye redness. Likely viral vs early bacterial conjunctivitis. No update with flurescein and no obvious foreign bodies. Will dc home with erythromycin ointment.   Final Clinical Impressions(s) / ED Diagnoses   Final diagnoses:  None    ED Discharge Orders    None       Drenda Freeze, MD 06/20/17 1610    Drenda Freeze, MD 06/20/17 [phone]

## 2017-06-20 NOTE — ED Triage Notes (Signed)
Patient states that she started to have soreness and redness to her left eye starting yesterday  - the patient reports that she woke up this am and it was worse

## 2017-06-22 DIAGNOSIS — H00024 Hordeolum internum left upper eyelid: Secondary | ICD-10-CM | POA: Diagnosis not present

## 2017-06-22 DIAGNOSIS — H0011 Chalazion right upper eyelid: Secondary | ICD-10-CM | POA: Diagnosis not present

## 2017-06-24 ENCOUNTER — Other Ambulatory Visit: Payer: Self-pay

## 2017-07-23 DIAGNOSIS — Z803 Family history of malignant neoplasm of breast: Secondary | ICD-10-CM | POA: Diagnosis not present

## 2017-07-23 DIAGNOSIS — Z1231 Encounter for screening mammogram for malignant neoplasm of breast: Secondary | ICD-10-CM | POA: Diagnosis not present

## 2017-11-09 DIAGNOSIS — E782 Mixed hyperlipidemia: Secondary | ICD-10-CM | POA: Diagnosis not present

## 2017-11-09 DIAGNOSIS — E039 Hypothyroidism, unspecified: Secondary | ICD-10-CM | POA: Diagnosis not present

## 2017-11-09 DIAGNOSIS — I10 Essential (primary) hypertension: Secondary | ICD-10-CM | POA: Diagnosis not present

## 2017-11-16 DIAGNOSIS — I10 Essential (primary) hypertension: Secondary | ICD-10-CM | POA: Diagnosis not present

## 2017-11-16 DIAGNOSIS — R748 Abnormal levels of other serum enzymes: Secondary | ICD-10-CM | POA: Diagnosis not present

## 2017-11-16 DIAGNOSIS — E039 Hypothyroidism, unspecified: Secondary | ICD-10-CM | POA: Diagnosis not present

## 2017-11-16 DIAGNOSIS — E782 Mixed hyperlipidemia: Secondary | ICD-10-CM | POA: Diagnosis not present

## 2017-11-16 DIAGNOSIS — K219 Gastro-esophageal reflux disease without esophagitis: Secondary | ICD-10-CM | POA: Diagnosis not present

## 2017-11-18 ENCOUNTER — Emergency Department (HOSPITAL_BASED_OUTPATIENT_CLINIC_OR_DEPARTMENT_OTHER)
Admission: EM | Admit: 2017-11-18 | Discharge: 2017-11-18 | Disposition: A | Discharge status: Home / Self Care | Payer: Medicare Other | Attending: Emergency Medicine | Admitting: Emergency Medicine

## 2017-11-18 ENCOUNTER — Other Ambulatory Visit: Payer: Self-pay

## 2017-11-18 ENCOUNTER — Encounter (HOSPITAL_BASED_OUTPATIENT_CLINIC_OR_DEPARTMENT_OTHER): Payer: Self-pay

## 2017-11-18 DIAGNOSIS — N3090 Cystitis, unspecified without hematuria: Secondary | ICD-10-CM | POA: Insufficient documentation

## 2017-11-18 DIAGNOSIS — E039 Hypothyroidism, unspecified: Secondary | ICD-10-CM | POA: Diagnosis not present

## 2017-11-18 DIAGNOSIS — I5032 Chronic diastolic (congestive) heart failure: Secondary | ICD-10-CM | POA: Diagnosis not present

## 2017-11-18 DIAGNOSIS — I110 Hypertensive heart disease with heart failure: Secondary | ICD-10-CM | POA: Diagnosis not present

## 2017-11-18 DIAGNOSIS — R3915 Urgency of urination: Secondary | ICD-10-CM | POA: Diagnosis not present

## 2017-11-18 DIAGNOSIS — R1030 Lower abdominal pain, unspecified: Secondary | ICD-10-CM | POA: Diagnosis not present

## 2017-11-18 DIAGNOSIS — R300 Dysuria: Secondary | ICD-10-CM | POA: Diagnosis not present

## 2017-11-18 HISTORY — DX: Chronic diastolic (congestive) heart failure: I50.32

## 2017-11-18 LAB — URINALYSIS, ROUTINE W REFLEX MICROSCOPIC
BILIRUBIN URINE: NEGATIVE
Glucose, UA: NEGATIVE mg/dL
KETONES UR: NEGATIVE mg/dL
Nitrite: NEGATIVE
Protein, ur: NEGATIVE mg/dL
pH: 7 (ref 5.0–8.0)

## 2017-11-18 LAB — URINALYSIS, MICROSCOPIC (REFLEX): WBC, UA: >50 WBC/hpf (ref 0–5)

## 2017-11-18 MED ORDER — SULFAMETHOXAZOLE-TRIMETHOPRIM 800-160 MG PO TABS: 1.0000 | ORAL_TABLET | Freq: Two times a day (BID) | ORAL | 0 refills | Status: AC

## 2017-11-18 MED ORDER — SULFAMETHOXAZOLE-TRIMETHOPRIM 800-160 MG PO TABS
1.0000 | ORAL_TABLET | Freq: Once | ORAL | Status: AC
  Administered 2017-11-18: 1 via ORAL
  Filled 2017-11-18: qty 1

## 2017-11-18 NOTE — ED Triage Notes (Signed)
C/o UTI sx x today-NAD-steady gait-to BR as soon as in triage

## 2017-11-18 NOTE — Discharge Instructions (Signed)

## 2017-11-18 NOTE — ED Provider Notes (Signed)
Emergency Department Provider Note   I have reviewed the triage vital signs and the nursing notes.   HISTORY  Chief Complaint Dysuria   HPI Kristin Moon is a 72 y.o. female with PMH of CHF, HTN, and HLD presents to the emergency department for evaluation of dysuria, hesitancy, urgency with suprapubic abdominal discomfort.  Patient denies any vaginal bleeding or discharge.  She has a prior history of urinary tract infection which feels similar to this.  She denies any flank or back discomfort.  No fevers but did feel slight chills today. No rigors when described.  Denies any diarrhea or vomiting.    Past Medical History:  Diagnosis Date  . Chronic diastolic CHF (congestive heart failure) ([place])   . Diastolic heart failure    Grade 1 per echo May 2012  . Hyperlipidemia   . Hypertension   . Hypothyroidism   . Left bundle branch block   . Non Hodgkin's lymphoma ([place])    history  . Non Hodgkin's lymphoma ([place])   . S/P right and left heart catheterization May 2012   minimal CAD. EF of 50%  . Transaminitis     Patient Active Problem List   Diagnosis Date Noted  . LBBB (left bundle branch block) 12/02/2012  . Chronic diastolic CHF (congestive heart failure) ([place])   . Transaminitis   . Non Hodgkin's lymphoma ([place])   . Hyperlipidemia   . SOB (shortness of breath)   . Hyperthyroidism     Past Surgical History:  Procedure Laterality Date  . ABDOMINAL HYSTERECTOMY  1983  . CARDIAC CATHETERIZATION  10/17/2010    Left ventriculography shows mild global left ventricular hypokinesisThe LVEF is estimated at 50%.    Current Outpatient Rx  . Order #: [id number] Class: OTC  . Order #: [id number] Class: Normal  . Order #: [id number] Class: Historical Med  . Order #: [id number] Class: Print  . Order #: [id number] Class: Historical Med  . Order #: [id number] Class: Historical Med  . Order #: [id number] Class: Normal  . Order #: [id number] Class: Historical Med  . Order #: [id number] Class: Normal   . Order #: [id number] Class: Historical Med  . Order #: [id number] Class: Normal  . Order #: [id number] Class: Historical Med  . Order #: [id number] Class: Normal  . Order #: [id number] Class: Print    Allergies Metoprolol  Family History  Problem Relation Age of Onset  . Heart attack Father 33  . Hypertension Father   . Heart disease Father   . Hypertension Mother     Social History Social History   Tobacco Use  . Smoking status: Never Smoker  . Smokeless tobacco: Never Used  Substance Use Topics  . Alcohol use: No  . Drug use: No    Review of Systems  Constitutional: No fever/chills Eyes: No visual changes. ENT: No sore throat. Cardiovascular: Denies chest pain. Respiratory: Denies shortness of breath. Gastrointestinal: Positive lower abdominal pain.  No nausea, no vomiting.  No diarrhea.  No constipation. Genitourinary: Positive for dysuria. Musculoskeletal: Negative for back pain. Skin: Negative for rash. Neurological: Negative for headaches, focal weakness or numbness.  10-point ROS otherwise negative.  ____________________________________________   PHYSICAL EXAM:  VITAL SIGNS: ED Triage Vitals  Enc Vitals Group     BP 11/18/17 2131 (!) 152/63     Pulse Rate 11/18/17 2131 98     Resp 11/18/17 2131 20     Temp 11/18/17 2131 98.2 F (36.8 C)     Temp Source 11/18/17 2131 Axillary     SpO2 11/18/17 2131  99 %     Weight 11/18/17 2133 167 lb (75.8 kg)     Height 11/18/17 2133 5\' 2"  (1.575 m)     Pain Score 11/18/17 2129 10   Constitutional: Alert and oriented. Well appearing and in no acute distress. Eyes: Conjunctivae are normal.  Head: Atraumatic. Nose: No congestion/rhinnorhea. Mouth/Throat: Mucous membranes are moist.   Neck: No stridor.  Cardiovascular: Normal rate, regular rhythm. Good peripheral circulation. Grossly normal heart sounds.   Respiratory: Normal respiratory effort.  No retractions. Lungs CTAB. Gastrointestinal: Soft and nontender. No  distention. No CVA or flank tenderness.  Musculoskeletal: No lower extremity tenderness nor edema. No gross deformities of extremities. Neurologic:  Normal speech and language. No gross focal neurologic deficits are appreciated.  Skin:  Skin is warm, dry and intact. No rash noted.  ____________________________________________   LABS (all labs ordered are listed, but only abnormal results are displayed)  Labs Reviewed  URINALYSIS, ROUTINE W REFLEX MICROSCOPIC - Abnormal; Notable for the following components:      Result Value   APPearance HAZY (*)    Specific Gravity, Urine <1.005 (*)    Hgb urine dipstick LARGE (*)    Leukocytes, UA LARGE (*)    All other components within normal limits  URINALYSIS, MICROSCOPIC (REFLEX) - Abnormal; Notable for the following components:   Bacteria, UA FEW (*)    All other components within normal limits   ____________________________________________   PROCEDURES  Procedure(s) performed:   Procedures  None ____________________________________________   INITIAL IMPRESSION / ASSESSMENT AND PLAN / ED COURSE  Pertinent labs & imaging results that were available during my care of the patient were reviewed by me and considered in my medical decision making (see chart for details).  Patient presents to the emergency department for evaluation of dysuria, hesitancy, urgency.  She is having only suprapubic discomfort with no CVA tenderness on exam.  Not complaining of back or flank pain.  She is afebrile here.  My concern for pyonephritis is very low.  Plan for Bactrim as the patient recently completed a course of Keflex for URI symptoms and felt "shaky" while taking that medicine.   At this time, I do not feel there is any life-threatening condition present. I have reviewed and discussed all results (EKG, imaging, lab, urine as appropriate), exam findings with patient. I have reviewed nursing notes and appropriate previous records.  I feel the patient  is safe to be discharged home without further emergent workup. Discussed usual and customary return precautions. Patient and family (if present) verbalize understanding and are comfortable with this plan.  Patient will follow-up with their primary care provider. If they do not have a primary care provider, information for follow-up has been provided to them. All questions have been answered.  ____________________________________________  FINAL CLINICAL IMPRESSION(S) / ED DIAGNOSES  Final diagnoses:  Cystitis     MEDICATIONS GIVEN DURING THIS VISIT:  Medications  sulfamethoxazole-trimethoprim (BACTRIM DS,SEPTRA DS) 800-160 MG per tablet 1 tablet (1 tablet Oral Given 11/18/17 2156)     NEW OUTPATIENT MEDICATIONS STARTED DURING THIS VISIT:  Discharge Medication List as of 11/18/2017  9:52 PM    START taking these medications   Details  sulfamethoxazole-trimethoprim (BACTRIM DS,SEPTRA DS) 800-160 MG tablet Take 1 tablet by mouth 2 (two) times daily for 7 days., Starting Wed 11/18/2017, Until Wed 11/25/2017, Print        Note:  This document was prepared using Dragon voice recognition software and may include unintentional dictation errors.  Nanda Quinton, MD Emergency Medicine    Adaya Garmany, Wonda Olds, MD 11/19/17 [phone]

## 2017-11-19 DIAGNOSIS — N200 Calculus of kidney: Secondary | ICD-10-CM | POA: Diagnosis not present

## 2017-11-19 DIAGNOSIS — R748 Abnormal levels of other serum enzymes: Secondary | ICD-10-CM | POA: Diagnosis not present

## 2017-11-19 DIAGNOSIS — R160 Hepatomegaly, not elsewhere classified: Secondary | ICD-10-CM | POA: Diagnosis not present

## 2017-11-20 DIAGNOSIS — I10 Essential (primary) hypertension: Secondary | ICD-10-CM | POA: Diagnosis not present

## 2017-11-20 DIAGNOSIS — L821 Other seborrheic keratosis: Secondary | ICD-10-CM | POA: Diagnosis not present

## 2017-11-20 DIAGNOSIS — D229 Melanocytic nevi, unspecified: Secondary | ICD-10-CM | POA: Diagnosis not present

## 2017-11-20 DIAGNOSIS — Z87440 Personal history of urinary (tract) infections: Secondary | ICD-10-CM | POA: Diagnosis not present

## 2017-11-20 DIAGNOSIS — D1801 Hemangioma of skin and subcutaneous tissue: Secondary | ICD-10-CM | POA: Diagnosis not present

## 2017-11-20 DIAGNOSIS — E782 Mixed hyperlipidemia: Secondary | ICD-10-CM | POA: Diagnosis not present

## 2017-11-20 DIAGNOSIS — Z85828 Personal history of other malignant neoplasm of skin: Secondary | ICD-10-CM | POA: Diagnosis not present

## 2017-11-20 DIAGNOSIS — L814 Other melanin hyperpigmentation: Secondary | ICD-10-CM | POA: Diagnosis not present

## 2017-11-27 DIAGNOSIS — H00012 Hordeolum externum right lower eyelid: Secondary | ICD-10-CM | POA: Diagnosis not present

## 2017-12-02 ENCOUNTER — Telehealth (INDEPENDENT_AMBULATORY_CARE_PROVIDER_SITE_OTHER): Payer: Self-pay | Admitting: Physical Medicine and Rehabilitation

## 2017-12-02 NOTE — Telephone Encounter (Signed)
Called patient and left message asking her to call back and leave a message letting me know where her pain is and how much injection in August of 2018 helped if her pain is in the same area as it was then.

## 2017-12-02 NOTE — Telephone Encounter (Signed)
Yes ok, had a long talk with her about same.

## 2017-12-02 NOTE — Telephone Encounter (Signed)
Bilateral L4-5 and L5-S1 facets 01/29/17. Pain is in the same area. Last injections lasted about 3 months and she has been "just dealing with it" since then. Ok to repeat?

## 2017-12-02 NOTE — Telephone Encounter (Signed)
Injection   Pt came into the office would like to sched appt

## 2017-12-03 NOTE — Telephone Encounter (Signed)
Left message for patient to call back to schedule.  °

## 2017-12-03 NOTE — Telephone Encounter (Signed)
Scheduled for 12/15/17 at 0915. Patient confirmed that she will have a driver.

## 2017-12-08 ENCOUNTER — Emergency Department (HOSPITAL_BASED_OUTPATIENT_CLINIC_OR_DEPARTMENT_OTHER): Payer: Medicare Other

## 2017-12-08 ENCOUNTER — Emergency Department (HOSPITAL_BASED_OUTPATIENT_CLINIC_OR_DEPARTMENT_OTHER)
Admission: EM | Admit: 2017-12-08 | Discharge: 2017-12-08 | Disposition: A | Discharge status: Home / Self Care | Payer: Medicare Other | Attending: Emergency Medicine | Admitting: Emergency Medicine

## 2017-12-08 ENCOUNTER — Encounter (HOSPITAL_BASED_OUTPATIENT_CLINIC_OR_DEPARTMENT_OTHER): Payer: Self-pay | Admitting: Emergency Medicine

## 2017-12-08 ENCOUNTER — Other Ambulatory Visit: Payer: Self-pay

## 2017-12-08 DIAGNOSIS — R05 Cough: Secondary | ICD-10-CM | POA: Diagnosis not present

## 2017-12-08 DIAGNOSIS — E785 Hyperlipidemia, unspecified: Secondary | ICD-10-CM | POA: Insufficient documentation

## 2017-12-08 DIAGNOSIS — I5032 Chronic diastolic (congestive) heart failure: Secondary | ICD-10-CM | POA: Insufficient documentation

## 2017-12-08 DIAGNOSIS — Z7982 Long term (current) use of aspirin: Secondary | ICD-10-CM | POA: Insufficient documentation

## 2017-12-08 DIAGNOSIS — R509 Fever, unspecified: Secondary | ICD-10-CM | POA: Diagnosis present

## 2017-12-08 DIAGNOSIS — E039 Hypothyroidism, unspecified: Secondary | ICD-10-CM | POA: Insufficient documentation

## 2017-12-08 DIAGNOSIS — I110 Hypertensive heart disease with heart failure: Secondary | ICD-10-CM | POA: Insufficient documentation

## 2017-12-08 DIAGNOSIS — J189 Pneumonia, unspecified organism: Secondary | ICD-10-CM

## 2017-12-08 DIAGNOSIS — Z79899 Other long term (current) drug therapy: Secondary | ICD-10-CM | POA: Diagnosis not present

## 2017-12-08 DIAGNOSIS — J181 Lobar pneumonia, unspecified organism: Secondary | ICD-10-CM

## 2017-12-08 LAB — CBC WITH DIFFERENTIAL/PLATELET
BASOS ABS: 0 10*3/uL (ref 0.0–0.1)
BASOS PCT: 0 %
EOS ABS: 0 10*3/uL (ref 0.0–0.7)
Eosinophils Relative: 0 %
HCT: 31.4 % — ABNORMAL LOW (ref 36.0–46.0)
HEMOGLOBIN: 9.9 g/dL — AB (ref 12.0–15.0)
LYMPHS ABS: 1.8 10*3/uL (ref 0.7–4.0)
Lymphocytes Relative: 15 %
MCH: 24.3 pg — ABNORMAL LOW (ref 26.0–34.0)
MCHC: 31.5 g/dL (ref 30.0–36.0)
MCV: 77.1 fL — ABNORMAL LOW (ref 78.0–100.0)
Monocytes Absolute: 1.3 10*3/uL — ABNORMAL HIGH (ref 0.1–1.0)
Monocytes Relative: 10 %
NEUTROS PCT: 75 %
Neutro Abs: 9 10*3/uL — ABNORMAL HIGH (ref 1.7–7.7)
Platelets: 229 10*3/uL (ref 150–400)
RBC: 4.07 MIL/uL (ref 3.87–5.11)
RDW: 16.2 % — ABNORMAL HIGH (ref 11.5–15.5)
WBC: 12 10*3/uL — AB (ref 4.0–10.5)

## 2017-12-08 LAB — COMPREHENSIVE METABOLIC PANEL
ALBUMIN: 3.3 g/dL — AB (ref 3.5–5.0)
ALT: 35 U/L (ref 0–44)
AST: 47 U/L — AB (ref 15–41)
Alkaline Phosphatase: 259 U/L — ABNORMAL HIGH (ref 38–126)
Anion gap: 9 (ref 5–15)
BUN: 18 mg/dL (ref 8–23)
CALCIUM: 8.6 mg/dL — AB (ref 8.9–10.3)
CO2: 23 mmol/L (ref 22–32)
Chloride: 103 mmol/L (ref 98–111)
Creatinine, Ser: 0.87 mg/dL (ref 0.44–1.00)
GFR calc Af Amer: >60 mL/min (ref >60)
GFR calc non Af Amer: >60 mL/min (ref >60)
Glucose, Bld: 140 mg/dL — ABNORMAL HIGH (ref 70–99)
Potassium: 3.8 mmol/L (ref 3.5–5.1)
SODIUM: 135 mmol/L (ref 135–145)
Total Bilirubin: 0.4 mg/dL (ref 0.3–1.2)
Total Protein: 6.9 g/dL (ref 6.5–8.1)

## 2017-12-08 MED ORDER — DOXYCYCLINE HYCLATE 100 MG PO CAPS: 100.0000 mg | ORAL_CAPSULE | Freq: Two times a day (BID) | ORAL | 0 refills | Status: DC

## 2017-12-08 NOTE — ED Notes (Signed)
Pt unable to give sample at this time

## 2017-12-08 NOTE — Discharge Instructions (Signed)
Take tylenol 2 pills 4 times a day and motrin 4 pills 3 times a day.  Drink plenty of fluids.  Return for worsening shortness of breath, headache, confusion. Follow up with your family doctor.   

## 2017-12-08 NOTE — ED Notes (Signed)
Patient transported to X-ray 

## 2017-12-08 NOTE — ED Notes (Signed)
Pt unable to provide urine sample at this time 

## 2017-12-08 NOTE — ED Provider Notes (Signed)
[facility] EMERGENCY DEPARTMENT Provider Note   CSN: [id number] Arrival date & time: 12/08/17  2118     History   Chief Complaint Chief Complaint  Patient presents with  . Fever    HPI Kristin Moon is a 72 y.o. female.  72 yo F with a cc of cough, fevers, chills, muscle aches.  Going on for past couple days.  Recently traveled to [place] and since then has felt ill.  Denies abdominal pain, dysuria or flank pain.  Has had a couple episodes of emesis.  Denies diarrhea.  Denies tick bite.  Denies sick contacts.   The history is provided by the patient.  Fever   Pertinent negatives include no chest pain and no headaches.  Illness  This is a new problem. The current episode started 2 days ago. The problem occurs constantly. The problem has been gradually worsening. Pertinent negatives include no chest pain, no abdominal pain, no headaches and no shortness of breath. Nothing aggravates the symptoms. Nothing relieves the symptoms. She has tried nothing for the symptoms. The treatment provided no relief.    Past Medical History:  Diagnosis Date  . Chronic diastolic CHF (congestive heart failure) ([place])   . Diastolic heart failure    Grade 1 per echo May 2012  . Hyperlipidemia   . Hypertension   . Hypothyroidism   . Left bundle branch block   . Non Hodgkin's lymphoma ([place])    history  . Non Hodgkin's lymphoma ([place])   . S/P right and left heart catheterization May 2012   minimal CAD. EF of 50%  . Transaminitis     Patient Active Problem List   Diagnosis Date Noted  . LBBB (left bundle branch block) 12/02/2012  . Chronic diastolic CHF (congestive heart failure) ([place])   . Transaminitis   . Non Hodgkin's lymphoma ([place])   . Hyperlipidemia   . SOB (shortness of breath)   . Hyperthyroidism     Past Surgical History:  Procedure Laterality Date  . ABDOMINAL HYSTERECTOMY  1983  . CARDIAC CATHETERIZATION  10/17/2010    Left ventriculography shows mild global left  ventricular hypokinesisThe LVEF is estimated at 50%.  [facility] FOOT FRACTURE SURGERY       OB History   None      Home Medications    Prior to Admission medications   Medication Sig Start Date End Date Taking? Authorizing Provider  aspirin EC 81 MG tablet Take 1 tablet (81 mg total) by mouth daily. 12/06/13   Nahser, Wonda Cheng, MD  carvedilol (COREG) 6.25 MG tablet TAKE 1 TABLET BY MOUTH 2 TIMES DAILY WITH A MEAL. 12/11/16   Nahser, Wonda Cheng, MD  Cholecalciferol (VITAMIN D3) 2000 UNITS TABS Take 1 tablet by mouth daily.     [provider]  doxycycline (VIBRAMYCIN) 100 MG capsule Take 1 capsule (100 mg total) by mouth 2 (two) times daily. One po bid x 7 days 12/08/17   Deno Etienne, DO  erythromycin ophthalmic ointment Place a 1/2 inch ribbon of ointment into the lower eyelid twice daily 06/20/17   Drenda Freeze, MD  estrogens conjugated, synthetic A, (CENESTIN) 0.625 MG tablet Take 0.625 mg by mouth daily.      [provider]  fluticasone (FLONASE) 50 MCG/ACT nasal spray Place 1 spray into both nostrils as needed for allergies or rhinitis.    [provider]  furosemide (LASIX) 20 MG tablet Take 1 tablet (20 mg total) by mouth daily. 09/10/16  Nahser, Wonda Cheng, MD  levothyroxine (SYNTHROID, LEVOTHROID) 75 MCG tablet Take 75 mcg by mouth daily. 10/20/14   [provider]  lisinopril (PRINIVIL,ZESTRIL) 2.5 MG tablet TAKE 1 TABLET (2.5 MG TOTAL) BY MOUTH DAILY. 09/18/16   Nahser, Wonda Cheng, MD  lovastatin (MEVACOR) 40 MG tablet Take 40 mg by mouth daily. 12/06/14   [provider]  meloxicam (MOBIC) 15 MG tablet Take 1 tablet (15 mg total) by mouth daily. Take with food 01/14/17   Magnus Sinning, MD  omeprazole (PRILOSEC OTC) 20 MG tablet Take 20 mg by mouth daily.      [provider]  potassium chloride SA (KLOR-CON M20) 20 MEQ tablet TAKE 1 TABLET (20 MEQ TOTAL) BY MOUTH 2 (TWO) TIMES DAILY. 09/10/16   Nahser, Wonda Cheng, MD    Family History Family  History  Problem Relation Age of Onset  . Heart attack Father 74  . Hypertension Father   . Heart disease Father   . Hypertension Mother     Social History Social History   Tobacco Use  . Smoking status: Never Smoker  . Smokeless tobacco: Never Used  Substance Use Topics  . Alcohol use: No  . Drug use: No     Allergies   Metoprolol   Review of Systems Review of Systems  Constitutional: Positive for fever.  Respiratory: Negative for shortness of breath.   Cardiovascular: Negative for chest pain.  Gastrointestinal: Negative for abdominal pain.  Neurological: Negative for headaches.     Physical Exam Updated Vital Signs BP (!) 132/53 (BP Location: Left Arm)   Pulse 92   Temp 98.3 F (36.8 C) (Oral)   Resp 20   Ht 5\' 2"  (1.575 m)   Wt 74.8 kg (165 lb)   SpO2 98%   BMI 30.18 kg/m   Physical Exam  Constitutional: She is oriented to person, place, and time. She appears well-developed and well-nourished. No distress.  HENT:  Head: Normocephalic and atraumatic.  Eyes: Pupils are equal, round, and reactive to light. EOM are normal.  Neck: Normal range of motion. Neck supple.  Cardiovascular: Normal rate and regular rhythm. Exam reveals no gallop and no friction rub.  No murmur heard. Pulmonary/Chest: Effort normal. She has no wheezes. She has no rales.  Abdominal: Soft. She exhibits no distension and no mass. There is no tenderness. There is no guarding.  Musculoskeletal: She exhibits no edema or tenderness.  Neurological: She is alert and oriented to person, place, and time.  Skin: Skin is warm and dry. She is not diaphoretic.  Psychiatric: She has a normal mood and affect. Her behavior is normal.  Nursing note and vitals reviewed.    ED Treatments / Results  Labs (all labs ordered are listed, but only abnormal results are displayed) Labs Reviewed  CBC WITH DIFFERENTIAL/PLATELET - Abnormal; Notable for the following components:      Result Value   WBC  12.0 (*)    Hemoglobin 9.9 (*)    HCT 31.4 (*)    MCV 77.1 (*)    MCH 24.3 (*)    RDW 16.2 (*)    Neutro Abs 9.0 (*)    Monocytes Absolute 1.3 (*)    All other components within normal limits  COMPREHENSIVE METABOLIC PANEL - Abnormal; Notable for the following components:   Glucose, Bld 140 (*)    Calcium 8.6 (*)    Albumin 3.3 (*)    AST 47 (*)    Alkaline Phosphatase 259 (*)  All other components within normal limits  URINALYSIS, ROUTINE W REFLEX MICROSCOPIC    EKG None  Radiology Dg Chest 2 View  Result Date: 12/08/2017 CLINICAL DATA:  Cough and fever EXAM: CHEST - 2 VIEW COMPARISON:  10/17/2010 FINDINGS: Posterior lung infiltrate, probably right lower lobe. Diffuse interstitial thickening. No pleural effusion. Normal heart size. No pneumothorax. IMPRESSION: Posterior lung opacity, may reflect pneumonia. Imaging follow-up to resolution recommended. Electronically Signed   By: Donavan Foil M.D.   On: 12/08/2017 22:44    Procedures Procedures (including critical care time)  Medications Ordered in ED Medications - No data to display   Initial Impression / Assessment and Plan / ED Course  I have reviewed the triage vital signs and the nursing notes.  Pertinent labs & imaging results that were available during my care of the patient were reviewed by me and considered in my medical decision making (see chart for details).     72 yo F with a cc of cough and fever.  CXR with posterior infiltrate as viewed be me.  Concerning for PNA.  Start on abx.  Otherwise well appearing and non toxic.  D/c home.  PCP follow up.   11:52 PM:  I have discussed the diagnosis/risks/treatment options with the patient and believe the pt to be eligible for discharge home to follow-up with PCP. We also discussed returning to the ED immediately if new or worsening sx occur. We discussed the sx which are most concerning (e.g., sudden worsening pain, fever, inability to tolerate by mouth) that  necessitate immediate return. Medications administered to the patient during their visit and any new prescriptions provided to the patient are listed below.  Medications given during this visit Medications - No data to display    The patient appears reasonably screen and/or stabilized for discharge and I doubt any other medical condition or other [facility] requiring further screening, evaluation, or treatment in the ED at this time prior to discharge.    Final Clinical Impressions(s) / ED Diagnoses   Final diagnoses:  Community acquired pneumonia of left lower lobe of lung [facility])    ED Discharge Orders        Ordered    doxycycline (VIBRAMYCIN) 100 MG capsule  2 times daily     12/08/17 2257       Deno Etienne, DO 12/08/17 2352

## 2017-12-08 NOTE — ED Triage Notes (Signed)
Pt is c/o fever since Sunday  Pt states today she has done nothing but sleep  Pt has been to [place] for a family reunion  Pt states she is not been able to eat  Has not had N/V/D  Pt states has had some light headedness

## 2017-12-14 ENCOUNTER — Encounter (INDEPENDENT_AMBULATORY_CARE_PROVIDER_SITE_OTHER): Payer: Self-pay | Admitting: Physical Medicine and Rehabilitation

## 2017-12-14 ENCOUNTER — Ambulatory Visit (INDEPENDENT_AMBULATORY_CARE_PROVIDER_SITE_OTHER): Payer: Self-pay

## 2017-12-14 ENCOUNTER — Ambulatory Visit (INDEPENDENT_AMBULATORY_CARE_PROVIDER_SITE_OTHER): Payer: Medicare Other | Admitting: Physical Medicine and Rehabilitation

## 2017-12-14 VITALS — BP 133/58 | HR 83

## 2017-12-14 DIAGNOSIS — G8929 Other chronic pain: Secondary | ICD-10-CM

## 2017-12-14 DIAGNOSIS — R0602 Shortness of breath: Secondary | ICD-10-CM

## 2017-12-14 DIAGNOSIS — J181 Lobar pneumonia, unspecified organism: Secondary | ICD-10-CM | POA: Diagnosis not present

## 2017-12-14 DIAGNOSIS — D508 Other iron deficiency anemias: Secondary | ICD-10-CM | POA: Diagnosis not present

## 2017-12-14 DIAGNOSIS — J189 Pneumonia, unspecified organism: Secondary | ICD-10-CM

## 2017-12-14 DIAGNOSIS — M47816 Spondylosis without myelopathy or radiculopathy, lumbar region: Secondary | ICD-10-CM

## 2017-12-14 DIAGNOSIS — R5383 Other fatigue: Secondary | ICD-10-CM | POA: Diagnosis not present

## 2017-12-14 DIAGNOSIS — M545 Low back pain: Secondary | ICD-10-CM

## 2017-12-14 MED ORDER — METHYLPREDNISOLONE ACETATE 80 MG/ML IJ SUSP
80.0000 mg | Freq: Once | INTRAMUSCULAR | Status: AC
  Administered 2017-12-14: 80 mg

## 2017-12-14 NOTE — Progress Notes (Signed)
 .  Numeric Pain Rating Scale and Functional Assessment Average Pain 8   In the last MONTH (on 0-10 scale) has pain interfered with the following?  1. General activity like being  able to carry out your everyday physical activities such as walking, climbing stairs, carrying groceries, or moving a chair?  Rating(5)   +Driver, -BT, -Dye Allergies.  

## 2017-12-14 NOTE — Patient Instructions (Signed)

## 2017-12-15 NOTE — Progress Notes (Signed)
Kristin Moon - 72 y.o. female MRN [id number]  Date of birth: 11/07/1945  Office Visit Note: Visit Date: 12/14/2017 PCP: Merrilee Seashore, MD Referred by: Merrilee Seashore, MD  Subjective: Chief Complaint  Patient presents with  . Lower Back - Pain   HPI: Kristin Moon is a very pleasant 72 year old female that we last saw in August 2018 to complete a diagnostic and hopefully therapeutic anesthetic medial branch blocks of the L4-5 and L5-S1 facet joints.  She reports probably 80% relief for 2 months.  We did use a small bit of cortisone at the time given the patient's symptoms in severity.  Unfortunately since that time she has had a few medical issues nothing severe that is really kept her from coming back and for second set of blocks.  She has not had recent physical therapy but has had some therapy in the remote past.  Her pain again is axial low back pain without any radicular complaints or hip or groin pain.  She is had no traumas.  No prior lumbar surgeries.  She reports that standing for any length of time really makes the pain worse and she left to find a place to sit down.  She reports no claudication symptoms.  She does use Tylenol at times and is really not tolerated stronger medications.  She was just recently diagnosed with a possible small pneumonia and was treated with antibiotics.  She does have follow-up today with her regular doctor.  She does not have a fever today.  She reports that her pain is rated as an 8 out of 10 when she is standing and it does limit her general activity.  The pain is been ongoing for several years and progressively worsening.  MRI of the lumbar spine is reviewed below and shows mainly facet arthritis at L4-5 and L5-S1.   Review of Systems  Constitutional: Negative for chills, fever, malaise/fatigue and weight loss.  HENT: Negative for hearing loss and sinus pain.   Eyes: Negative for blurred vision, double vision and photophobia.  Respiratory:  Positive for cough and shortness of breath.   Cardiovascular: Negative for chest pain, palpitations and leg swelling.  Gastrointestinal: Negative for abdominal pain, nausea and vomiting.  Genitourinary: Negative for flank pain.  Musculoskeletal: Positive for back pain. Negative for myalgias.  Skin: Negative for itching and rash.  Neurological: Negative for tremors, focal weakness and weakness.  Endo/Heme/Allergies: Negative.   Psychiatric/Behavioral: Negative for depression.  All other systems reviewed and are negative.  Otherwise per HPI.  Assessment & Plan: Visit Diagnoses:  1. Spondylosis without myelopathy or radiculopathy, lumbar region   2. Chronic bilateral low back pain without sciatica   3. SOB (shortness of breath)   4. Community acquired pneumonia of left lower lobe of lung ([name])     Plan: Findings:  Chronic worsening axial low back pain which is felt to be facet joint mediated back pain.  This was felt to be facet mediated pain we completed the diagnostic blocks and August of last year and those were diagnostic.  She got more than 80% relief for a short while.  She has no radicular pain and she does have consistent pain with facet joint loading on exam.  MRI is also consistent with this.  We are going to complete a second set of diagnostic blocks today and we explained the rationale for this with proceeding towards radiofrequency ablation.  May have her update physical therapy for a short course for strengthening.  Injection  should be okay given the recent diagnosis of possible small pneumonia.  She is afebrile she has been on antibiotics and usually we give people some small amount of steroid anyway and those conditions and it may help the breathing some.  She does have follow-up with her regular doctor.  Injections completed today.  We will see her back after we know the results.  We will look at radiofrequency ablation.    Meds & Orders:  Meds ordered this encounter    Medications  . methylPREDNISolone acetate (DEPO-MEDROL) injection 80 mg    Orders Placed This Encounter  Procedures  . Facet Injection  . XR C-ARM NO REPORT    Follow-up: Return if symptoms worsen or fail to improve.   Procedures: No procedures performed  Lumbar Diagnostic Facet Joint Nerve Block with Fluoroscopic Guidance   Patient: Kristin Moon      Date of Birth: 11-25-1945 MRN: [id number] PCP: Merrilee Seashore, MD      Visit Date: 12/14/2017   Universal Protocol:    Date/Time: 07/09/196:18 AM  Consent Given By: the patient  Position: PRONE  Additional Comments: Vital signs were monitored before and after the procedure. Patient was prepped and draped in the usual sterile fashion. The correct patient, procedure, and site was verified.   Injection Procedure Details:  Procedure Site One Meds Administered:  Meds ordered this encounter  Medications  . methylPREDNISolone acetate (DEPO-MEDROL) injection 80 mg     Laterality: Bilateral  Location/Site:  L4-L5 L5-S1  Needle size: 22 ga.  Needle type:spinal  Needle Placement: Oblique pedical  Findings:   -Comments: There was excellent flow of contrast along the articular pillars without intravascular flow.  Procedure Details: The fluoroscope beam is vertically oriented in AP and then obliqued 15 to 20 degrees to the ipsilateral side of the desired nerve to achieve the "Scotty dog" appearance.  The skin over the target area of the junction of the superior articulating process and the transverse process (sacral ala if blocking the L5 dorsal rami) was locally anesthetized with a 1 ml volume of 1% Lidocaine without Epinephrine.  The spinal needle was inserted and advanced in a trajectory view down to the target.   After contact with periosteum and negative aspirate for blood and CSF, correct placement without intravascular or epidural spread was confirmed by injecting 0.5 ml. of Isovue-250.  A spot radiograph was  obtained of this image.    Next, a 0.5 ml. volume of the injectate described above was injected. The needle was then redirected to the other facet joint nerves mentioned above if needed.  Prior to the procedure, the patient was given a Pain Diary which was completed for baseline measurements.  After the procedure, the patient rated their pain every 30 minutes and will continue rating at this frequency for a total of 5 hours.  The patient has been asked to complete the Diary and return to [place] by mail, fax or hand delivered as soon as possible.   Additional Comments:  The patient tolerated the procedure well Dressing: Band-Aid    Post-procedure details: Patient was observed during the procedure. Post-procedure instructions were reviewed.  Patient left the clinic in stable condition.   Clinical History: MRI LUMBAR SPINE WITHOUT CONTRAST 08/03/2008   Technique: Multiplanar and multiecho pulse sequences of the lumbar spine were obtained without intravenous contrast.   Comparison: None   Findings: Desiccation of the L4-L5 and L5-S1 disc. Shallow protrusion at L4-L5 with annular tear. No encroachment on the exiting  nerve roots is appreciated on the parasagittal images.   The conus terminates at T12-L1 which is normal appearance. The paravertebral structures are unremarkable.   Axial imaging: The individual disc spaces are examined as follows:     L2-3: Mild facet degenerative changes.   L3-4: A disc bulge eccentric left. No encroachment on the exiting L3 root. No central stenosis. Mild facet degenerative changes.   L4-5: Disc bulge. Shallow central protrusion with annular tear. No mass effect on the exiting nerve roots. Mild facet degenerative changes.   L5-S1: Facet degenerative changes bilaterally without central or lateral recess stenosis.   IMPRESSION: Mild degenerative disc disease as described above most notably at the L4-L5 level.   Facet degenerative changes at  L4-L5 and L5-S1. I question whether the patients symptoms may be facet mediated.   She reports that she has never smoked. She has never used smokeless tobacco. No results for input(s): HGBA1C, LABURIC in the last 8760 hours.  Objective:  VS:  HT:    WT:   BMI:     BP:(!) 133/58  HR:83bpm  TEMP: ( )  RESP:  Physical Exam  Constitutional: She is oriented to person, place, and time. She appears well-developed and well-nourished. No distress.  HENT:  Head: Normocephalic and atraumatic.  Nose: Nose normal.  Mouth/Throat: Oropharynx is clear and moist.  Eyes: Pupils are equal, round, and reactive to light. Conjunctivae are normal.  Neck: Normal range of motion. Neck supple.  Cardiovascular: Regular rhythm and intact distal pulses.  Pulmonary/Chest: Effort normal. No respiratory distress. She has no wheezes.  Abdominal: Soft. She exhibits no distension. There is no guarding.  Musculoskeletal:  Patient ambulates without aid.  She is somewhat slow to rise from a seated position and does have pain with facet joint loading and extension of the lumbar spine.  She has no pain over the trochanters and no pain with hip rotation has good distal strength.  Neurological: She is alert and oriented to person, place, and time. She exhibits normal muscle tone. Coordination normal.  Skin: Skin is warm. No rash noted. No erythema.  Psychiatric: She has a normal mood and affect. Her behavior is normal.  Nursing note and vitals reviewed.   Ortho Exam Imaging: Xr C-arm No Report  Result Date: 12/14/2017 Please see Notes tab for imaging impression.   Past Medical/Family/Surgical/Social History: Medications & Allergies reviewed per EMR, new medications updated. Patient Active Problem List   Diagnosis Date Noted  . LBBB (left bundle branch block) 12/02/2012  . Chronic diastolic CHF (congestive heart failure) ([place])   . Transaminitis   . Non Hodgkin's lymphoma ([place])   . Hyperlipidemia   . SOB (shortness  of breath)   . Hyperthyroidism    Past Medical History:  Diagnosis Date  . Chronic diastolic CHF (congestive heart failure) ([place])   . Diastolic heart failure    Grade 1 per echo May 2012  . Hyperlipidemia   . Hypertension   . Hypothyroidism   . Left bundle branch block   . Non Hodgkin's lymphoma ([place])    history  . Non Hodgkin's lymphoma ([place])   . S/P right and left heart catheterization May 2012   minimal CAD. EF of 50%  . Transaminitis    Family History  Problem Relation Age of Onset  . Heart attack Father 27  . Hypertension Father   . Heart disease Father   . Hypertension Mother    Past Surgical History:  Procedure Laterality Date  . ABDOMINAL  HYSTERECTOMY  1983  . CARDIAC CATHETERIZATION  10/17/2010    Left ventriculography shows mild global left ventricular hypokinesisThe LVEF is estimated at 50%.  Marland Kitchen FOOT FRACTURE SURGERY     Social History   Occupational History  . Occupation: substitute Product manager: [employer]  Tobacco Use  . Smoking status: Never Smoker  . Smokeless tobacco: Never Used  Substance and Sexual Activity  . Alcohol use: No  . Drug use: No  . Sexual activity: Not on file

## 2017-12-15 NOTE — Procedures (Signed)
Lumbar Diagnostic Facet Joint Nerve Block with Fluoroscopic Guidance   Patient: Kristin Moon      Date of Birth: [birth year] MRN: [id number] PCP: Merrilee Seashore, MD      Visit Date: 12/14/2017   Universal Protocol:    Date/Time: 07/09/196:18 AM  Consent Given By: the patient  Position: PRONE  Additional Comments: Vital signs were monitored before and after the procedure. Patient was prepped and draped in the usual sterile fashion. The correct patient, procedure, and site was verified.   Injection Procedure Details:  Procedure Site One Meds Administered:  Meds ordered this encounter  Medications  . methylPREDNISolone acetate (DEPO-MEDROL) injection 80 mg     Laterality: Bilateral  Location/Site:  L4-L5 L5-S1  Needle size: 22 ga.  Needle type:spinal  Needle Placement: Oblique pedical  Findings:   -Comments: There was excellent flow of contrast along the articular pillars without intravascular flow.  Procedure Details: The fluoroscope beam is vertically oriented in AP and then obliqued 15 to 20 degrees to the ipsilateral side of the desired nerve to achieve the "Scotty dog" appearance.  The skin over the target area of the junction of the superior articulating process and the transverse process (sacral ala if blocking the L5 dorsal rami) was locally anesthetized with a 1 ml volume of 1% Lidocaine without Epinephrine.  The spinal needle was inserted and advanced in a trajectory view down to the target.   After contact with periosteum and negative aspirate for blood and CSF, correct placement without intravascular or epidural spread was confirmed by injecting 0.5 ml. of Isovue-250.  A spot radiograph was obtained of this image.    Next, a 0.5 ml. volume of the injectate described above was injected. The needle was then redirected to the other facet joint nerves mentioned above if needed.  Prior to the procedure, the patient was given a Pain Diary which was completed  for baseline measurements.  After the procedure, the patient rated their pain every 30 minutes and will continue rating at this frequency for a total of 5 hours.  The patient has been asked to complete the Diary and return to [place] by mail, fax or hand delivered as soon as possible.   Additional Comments:  The patient tolerated the procedure well Dressing: Band-Aid    Post-procedure details: Patient was observed during the procedure. Post-procedure instructions were reviewed.  Patient left the clinic in stable condition.

## 2017-12-24 DIAGNOSIS — I10 Essential (primary) hypertension: Secondary | ICD-10-CM | POA: Diagnosis not present

## 2017-12-28 DIAGNOSIS — R5383 Other fatigue: Secondary | ICD-10-CM | POA: Diagnosis not present

## 2017-12-28 DIAGNOSIS — J181 Lobar pneumonia, unspecified organism: Secondary | ICD-10-CM | POA: Diagnosis not present

## 2017-12-28 DIAGNOSIS — D508 Other iron deficiency anemias: Secondary | ICD-10-CM | POA: Diagnosis not present

## 2018-01-07 ENCOUNTER — Telehealth (INDEPENDENT_AMBULATORY_CARE_PROVIDER_SITE_OTHER): Payer: Self-pay | Admitting: Physical Medicine and Rehabilitation

## 2018-01-07 NOTE — Telephone Encounter (Signed)
If relief for the week was more than 60% then would consider RFA, otherwise Lspine mRI - last 2010

## 2018-01-08 NOTE — Telephone Encounter (Signed)
Left message for patient to call back to let us know percentage of relief.

## 2018-01-11 NOTE — Telephone Encounter (Signed)
Patient called back and left a message stating that she "got good relief" for the first week. I called her back and left a message asking her to call back and let us know a percentage of relief so that we can determine what the next step should be.

## 2018-01-18 NOTE — Telephone Encounter (Signed)
Pt is scheduled for 02/09/18 and 02/22/18 with driver.

## 2018-01-18 NOTE — Telephone Encounter (Signed)
Per [facility] website.   Notification or Prior Authorization is not required for the requested services  This [employer]vant[employer] members plan does not currently require a prior authorization for these services.   Decision ID #:[id number]

## 2018-01-18 NOTE — Telephone Encounter (Signed)
Patient reports 100% relief for one week following facet injections. Needs auth for RFA.

## 2018-01-20 ENCOUNTER — Ambulatory Visit (INDEPENDENT_AMBULATORY_CARE_PROVIDER_SITE_OTHER): Payer: Medicare Other | Admitting: Family

## 2018-01-20 ENCOUNTER — Encounter (INDEPENDENT_AMBULATORY_CARE_PROVIDER_SITE_OTHER): Payer: Self-pay | Admitting: Family

## 2018-01-20 VITALS — Ht 62.0 in | Wt 164.0 lb

## 2018-01-20 DIAGNOSIS — L97521 Non-pressure chronic ulcer of other part of left foot limited to breakdown of skin: Secondary | ICD-10-CM | POA: Diagnosis not present

## 2018-01-22 ENCOUNTER — Encounter (INDEPENDENT_AMBULATORY_CARE_PROVIDER_SITE_OTHER): Payer: Self-pay | Admitting: Family

## 2018-01-22 NOTE — Progress Notes (Signed)
Office Visit Note   Patient: Kristin Moon           Date of Birth: [birth year]           MRN: [id number] Visit Date: 01/20/2018              Requested by: Merrilee Seashore, [address] [place] [place], [place] [address] PCP: Merrilee Seashore, MD  Chief Complaint  Patient presents with  . Left Foot - Pain      HPI: Patient is a 72 year old woman who presents today for initial evaluation of ulceration to the left fifth toe.  Has had ulceration for many months however this did worsen over the weekend with increased redness and pain.  Became more sensitive.  Has tried different shoewear including new balance walking sneakers however could not tolerate those.  Has tried corn pads as well as Neosporin.  Has tried to trim the thick skin.  Has tried ibuprofen for pain without relief.  Assessment & Plan: Visit Diagnoses:  1. Ulcer of toe of left foot, limited to breakdown of skin ([place])     Plan: Callus ulceration in part with a 10 blade knife following informed consent.  Patient voiced some relief following. discussed wide toe box shoe wear discussed padding of her donuts for her shoe wear.  She will continue to monitor this closely.  Patient comfortable following up as needed.  Follow-Up Instructions: Return if symptoms worsen or fail to improve.   Ortho Exam  Patient is alert, oriented, no adenopathy, well-dressed, normal affect, normal respiratory effort. On examination there is callus ulceration to the lateral border of her fifth toe.  Cannot appreciate a corn.  Thickened callus part with a 10 blade knife underlying ulcer is 3 mm in diameter 1 mm deep there is no drainage today no erythema no warmth no signs of infection  Imaging: No results found. No images are attached to the encounter.  Labs: Lab Results  Component Value Date   ESRSEDRATE 25 01/05/2006   REPTSTATUS 10/23/2010 FINAL 10/16/2010   CULT NO GROWTH 5 DAYS 10/16/2010     Lab Results    Component Value Date   ALBUMIN 3.3 (L) 12/08/2017   ALBUMIN 3.7 12/14/2014   ALBUMIN 3.8 10/29/2010    Body mass index is 30 kg/m.  Orders:  No orders of the defined types were placed in this encounter.  No orders of the defined types were placed in this encounter.    Procedures: No procedures performed  Clinical Data: No additional findings.  ROS:  All other systems negative, except as noted in the HPI. Review of Systems  Constitutional: Negative for chills and fever.  Skin: Positive for color change and wound.    Objective: Vital Signs: Ht 5\' 2"  (1.575 m)   Wt 164 lb (74.4 kg)   BMI 30.00 kg/m   Specialty Comments:  No specialty comments available.  PMFS History: Patient Active Problem List   Diagnosis Date Noted  . LBBB (left bundle branch block) 12/02/2012  . Chronic diastolic CHF (congestive heart failure) ([place])   . Transaminitis   . Non Hodgkin's lymphoma ([place])   . Hyperlipidemia   . SOB (shortness of breath)   . Hyperthyroidism    Past Medical History:  Diagnosis Date  . Chronic diastolic CHF (congestive heart failure) ([place])   . Diastolic heart failure    Grade 1 per echo May 2012  . Hyperlipidemia   . Hypertension   . Hypothyroidism   .  Left bundle branch block   . Non Hodgkin's lymphoma ([place])    history  . Non Hodgkin's lymphoma ([place])   . S/P right and left heart catheterization May 2012   minimal CAD. EF of 50%  . Transaminitis     Family History  Problem Relation Age of Onset  . Heart attack Father 42  . Hypertension Father   . Heart disease Father   . Hypertension Mother     Past Surgical History:  Procedure Laterality Date  . ABDOMINAL HYSTERECTOMY  1983  . CARDIAC CATHETERIZATION  10/17/2010    Left ventriculography shows mild global left ventricular hypokinesisThe LVEF is estimated at 50%.  Marland Kitchen FOOT FRACTURE SURGERY     Social History   Occupational History  . Occupation: substitute Product manager: [employer]  Tobacco Use  . Smoking status: Never Smoker  . Smokeless tobacco: Never Used  Substance and Sexual Activity  . Alcohol use: No  . Drug use: No  . Sexual activity: Not on file

## 2018-02-09 ENCOUNTER — Ambulatory Visit (INDEPENDENT_AMBULATORY_CARE_PROVIDER_SITE_OTHER): Payer: Medicare Other | Admitting: Physical Medicine and Rehabilitation

## 2018-02-09 ENCOUNTER — Encounter (INDEPENDENT_AMBULATORY_CARE_PROVIDER_SITE_OTHER): Payer: Self-pay | Admitting: Physical Medicine and Rehabilitation

## 2018-02-09 ENCOUNTER — Ambulatory Visit (INDEPENDENT_AMBULATORY_CARE_PROVIDER_SITE_OTHER): Payer: Self-pay

## 2018-02-09 VITALS — BP 129/49 | HR 85

## 2018-02-09 DIAGNOSIS — M47816 Spondylosis without myelopathy or radiculopathy, lumbar region: Secondary | ICD-10-CM

## 2018-02-09 MED ORDER — METHYLPREDNISOLONE ACETATE 80 MG/ML IJ SUSP
40.0000 mg | Freq: Once | INTRAMUSCULAR | Status: AC
  Administered 2018-02-09: 40 mg

## 2018-02-09 NOTE — Progress Notes (Signed)
 .  Numeric Pain Rating Scale and Functional Assessment Average Pain 8   In the last MONTH (on 0-10 scale) has pain interfered with the following?  1. General activity like being  able to carry out your everyday physical activities such as walking, climbing stairs, carrying groceries, or moving a chair?  Rating(6)   +Driver, -BT, -Dye Allergies.  

## 2018-02-09 NOTE — Patient Instructions (Signed)

## 2018-02-10 NOTE — Progress Notes (Signed)
Kristin Moon - 72 y.o. female MRN [id number]  Date of birth: [birth year]  Office Visit Note: Visit Date: 02/09/2018 PCP: Merrilee Seashore, MD Referred by: Merrilee Seashore, MD  Subjective: Chief Complaint  Patient presents with  . Lower Back - Pain  . Right Leg - Pain   HPI: Kristin Moon is a 71 year old female with chronic history of axial low back pain with some referral into the upper lateral hip but nothing down the leg.  She has had double diagnostic blocks with more than 80% relief.  She has had prior intra-articular facet block many years ago that gave her quite a bit of relief long-term.  She has x-ray and MRI imaging of mostly facet joint arthropathy particular at L4-5 and L5-S1.  She has pain with standing for any length of time.  She has pain on exam with facet joint loading and extension.  She has no nerve compression on imaging and no radicular complaints.  She has had physical therapy over the years and continues with home exercises.  This is documented as well.  She comes in today for planned radiofrequency ablation of the right L4-5 and L5-S1 facet joint for more definitive treatment.  This will be done with fluoroscopic guidance.  Please see our note today for further details of the procedure itself.  Depending on relief would look at doing the left side.   ROS Otherwise per HPI.  Assessment & Plan: Visit Diagnoses:  1. Spondylosis without myelopathy or radiculopathy, lumbar region     Plan: No additional findings.   Meds & Orders:  Meds ordered this encounter  Medications  . methylPREDNISolone acetate (DEPO-MEDROL) injection 40 mg    Orders Placed This Encounter  Procedures  . Radiofrequency,Lumbar  . XR C-ARM NO REPORT    Follow-up: Return if symptoms worsen or fail to improve.   Procedures: No procedures performed  Lumbar Facet Joint Nerve Denervation  Patient: Kristin Moon      Date of Birth: 1946-02-25 MRN: [id number] PCP: Merrilee Seashore,  MD      Visit Date: 02/09/2018   Universal Protocol:    Date/Time: 09/04/196:02 AM  Consent Given By: the patient  Position: PRONE  Additional Comments: Vital signs were monitored before and after the procedure. Patient was prepped and draped in the usual sterile fashion. The correct patient, procedure, and site was verified.   Injection Procedure Details:  Procedure Site One Meds Administered:  Meds ordered this encounter  Medications  . methylPREDNISolone acetate (DEPO-MEDROL) injection 40 mg     Laterality: Right  Location/Site:  L4-L5 L5-S1  Needle size: 18 G  Needle type: Radiofrequency cannula  Needle Placement: Along juncture of superior articular process and transverse pocess  Findings:  -Comments:  Procedure Details: For each desired target nerve, the corresponding transverse process (sacral ala for the L5 dorsal rami) was identified and the fluoroscope was positioned to square off the endplates of the corresponding vertebral body to achieve a true AP midline view.  The beam was then obliqued 15 to 20 degrees and caudally tilted 15 to 20 degrees to line up a trajectory along the target nerves. The skin over the target of the junction of superior articulating process and transverse process (sacral ala for the L5 dorsal rami) was infiltrated with 81ml of 1% Lidocaine without Epinephrine.  The 18 gauge 51mm active tip outer cannula was advanced in trajectory view to the target.  This procedure was repeated for each target nerve.  Then, for all  levels, the outer cannula placement was fine-tuned and the position was then confirmed with bi-planar imaging.    Test stimulation was done both at sensory and motor levels to ensure there was no radicular stimulation. The target tissues were then infiltrated with 1 ml of 1% Lidocaine without Epinephrine. Subsequently, a percutaneous neurotomy was carried out for 60 seconds at 80 degrees Celsius. The procedure was repeated with  the cannula rotated 90 degrees, for duration of 60 seconds, one additional time at each level for a total of two lesions per level.  After the completion of the two lesions, 1 ml of injectate was delivered. It was then repeated for each facet joint nerve mentioned above. Appropriate radiographs were obtained to verify the probe placement during the neurotomy.   Additional Comments:  The patient tolerated the procedure well Dressing: Band-Aid    Post-procedure details: Patient was observed during the procedure. Post-procedure instructions were reviewed.  Patient left the clinic in stable condition.      Clinical History: MRI LUMBAR SPINE WITHOUT CONTRAST 08/03/2008   Technique: Multiplanar and multiecho pulse sequences of the lumbar spine were obtained without intravenous contrast.   Comparison: None   Findings: Desiccation of the L4-L5 and L5-S1 disc. Shallow protrusion at L4-L5 with annular tear. No encroachment on the exiting nerve roots is appreciated on the parasagittal images.   The conus terminates at T12-L1 which is normal appearance. The paravertebral structures are unremarkable.   Axial imaging: The individual disc spaces are examined as follows:     L2-3: Mild facet degenerative changes.   L3-4: A disc bulge eccentric left. No encroachment on the exiting L3 root. No central stenosis. Mild facet degenerative changes.   L4-5: Disc bulge. Shallow central protrusion with annular tear. No mass effect on the exiting nerve roots. Mild facet degenerative changes.   L5-S1: Facet degenerative changes bilaterally without central or lateral recess stenosis.   IMPRESSION: Mild degenerative disc disease as described above most notably at the L4-L5 level.   Facet degenerative changes at L4-L5 and L5-S1. I question whether the patients symptoms may be facet mediated.   She reports that she has never smoked. She has never used smokeless tobacco. No results for input(s):  HGBA1C, LABURIC in the last 8760 hours.  Objective:  VS:  HT:    WT:   BMI:     BP:(!) 129/49  HR:85bpm  TEMP: ( )  RESP:  Physical Exam  Ortho Exam Imaging: Xr C-arm No Report  Result Date: 02/09/2018 Please see Notes tab for imaging impression.   Past Medical/Family/Surgical/Social History: Medications & Allergies reviewed per EMR, new medications updated. Patient Active Problem List   Diagnosis Date Noted  . LBBB (left bundle branch block) 12/02/2012  . Chronic diastolic CHF (congestive heart failure) ([place])   . Transaminitis   . Non Hodgkin's lymphoma ([place])   . Hyperlipidemia   . SOB (shortness of breath)   . Hyperthyroidism    Past Medical History:  Diagnosis Date  . Chronic diastolic CHF (congestive heart failure) ([place])   . Diastolic heart failure    Grade 1 per echo May 2012  . Hyperlipidemia   . Hypertension   . Hypothyroidism   . Left bundle branch block   . Non Hodgkin's lymphoma ([place])    history  . Non Hodgkin's lymphoma ([place])   . S/P right and left heart catheterization May 2012   minimal CAD. EF of 50%  . Transaminitis    Family History  Problem Relation  Age of Onset  . Heart attack Father 69  . Hypertension Father   . Heart disease Father   . Hypertension Mother    Past Surgical History:  Procedure Laterality Date  . ABDOMINAL HYSTERECTOMY  1983  . CARDIAC CATHETERIZATION  10/17/2010    Left ventriculography shows mild global left ventricular hypokinesisThe LVEF is estimated at 50%.  Marland Kitchen FOOT FRACTURE SURGERY     Social History   Occupational History  . Occupation: substitute Product manager: [employer]  Tobacco Use  . Smoking status: Never Smoker  . Smokeless tobacco: Never Used  Substance and Sexual Activity  . Alcohol use: No  . Drug use: No  . Sexual activity: Not on file

## 2018-02-10 NOTE — Procedures (Signed)
Lumbar Facet Joint Nerve Denervation  Patient: Kristin Moon      Date of Birth: 1945-11-01 MRN: [id number] PCP: Merrilee Seashore, MD      Visit Date: 02/09/2018   Universal Protocol:    Date/Time: 09/04/196:02 AM  Consent Given By: the patient  Position: PRONE  Additional Comments: Vital signs were monitored before and after the procedure. Patient was prepped and draped in the usual sterile fashion. The correct patient, procedure, and site was verified.   Injection Procedure Details:  Procedure Site One Meds Administered:  Meds ordered this encounter  Medications  . methylPREDNISolone acetate (DEPO-MEDROL) injection 40 mg     Laterality: Right  Location/Site:  L4-L5 L5-S1  Needle size: 18 G  Needle type: Radiofrequency cannula  Needle Placement: Along juncture of superior articular process and transverse pocess  Findings:  -Comments:  Procedure Details: For each desired target nerve, the corresponding transverse process (sacral ala for the L5 dorsal rami) was identified and the fluoroscope was positioned to square off the endplates of the corresponding vertebral body to achieve a true AP midline view.  The beam was then obliqued 15 to 20 degrees and caudally tilted 15 to 20 degrees to line up a trajectory along the target nerves. The skin over the target of the junction of superior articulating process and transverse process (sacral ala for the L5 dorsal rami) was infiltrated with 60ml of 1% Lidocaine without Epinephrine.  The 18 gauge 44mm active tip outer cannula was advanced in trajectory view to the target.  This procedure was repeated for each target nerve.  Then, for all levels, the outer cannula placement was fine-tuned and the position was then confirmed with bi-planar imaging.    Test stimulation was done both at sensory and motor levels to ensure there was no radicular stimulation. The target tissues were then infiltrated with 1 ml of 1% Lidocaine without  Epinephrine. Subsequently, a percutaneous neurotomy was carried out for 60 seconds at 80 degrees Celsius. The procedure was repeated with the cannula rotated 90 degrees, for duration of 60 seconds, one additional time at each level for a total of two lesions per level.  After the completion of the two lesions, 1 ml of injectate was delivered. It was then repeated for each facet joint nerve mentioned above. Appropriate radiographs were obtained to verify the probe placement during the neurotomy.   Additional Comments:  The patient tolerated the procedure well Dressing: Band-Aid    Post-procedure details: Patient was observed during the procedure. Post-procedure instructions were reviewed.  Patient left the clinic in stable condition.

## 2018-02-16 DIAGNOSIS — M21612 Bunion of left foot: Secondary | ICD-10-CM | POA: Diagnosis not present

## 2018-02-16 DIAGNOSIS — J189 Pneumonia, unspecified organism: Secondary | ICD-10-CM | POA: Diagnosis not present

## 2018-02-16 DIAGNOSIS — Z23 Encounter for immunization: Secondary | ICD-10-CM | POA: Diagnosis not present

## 2018-02-22 ENCOUNTER — Ambulatory Visit (INDEPENDENT_AMBULATORY_CARE_PROVIDER_SITE_OTHER): Payer: Medicare Other | Admitting: Physical Medicine and Rehabilitation

## 2018-02-22 ENCOUNTER — Ambulatory Visit (INDEPENDENT_AMBULATORY_CARE_PROVIDER_SITE_OTHER): Payer: Self-pay

## 2018-02-22 ENCOUNTER — Encounter (INDEPENDENT_AMBULATORY_CARE_PROVIDER_SITE_OTHER): Payer: Self-pay | Admitting: Physical Medicine and Rehabilitation

## 2018-02-22 VITALS — BP 126/48 | HR 79 | Temp 97.5°F

## 2018-02-22 DIAGNOSIS — M47816 Spondylosis without myelopathy or radiculopathy, lumbar region: Secondary | ICD-10-CM

## 2018-02-22 MED ORDER — METHYLPREDNISOLONE ACETATE 80 MG/ML IJ SUSP
40.0000 mg | Freq: Once | INTRAMUSCULAR | Status: AC
  Administered 2018-02-22: 40 mg

## 2018-02-22 NOTE — Progress Notes (Signed)
 .  Numeric Pain Rating Scale and Functional Assessment Average Pain 7   In the last MONTH (on 0-10 scale) has pain interfered with the following?  1. General activity like being  able to carry out your everyday physical activities such as walking, climbing stairs, carrying groceries, or moving a chair?  Rating(3)   +Driver, -BT, -Dye Allergies.  

## 2018-02-22 NOTE — Patient Instructions (Signed)

## 2018-02-23 NOTE — Procedures (Signed)
Lumbar Facet Joint Nerve Denervation  Patient: Kristin Moon      Date of Birth: 1945-08-25 MRN: [id number] PCP: Merrilee Seashore, MD      Visit Date: 02/22/2018   Universal Protocol:    Date/Time: 09/17/195:48 AM  Consent Given By: the patient  Position: PRONE  Additional Comments: Vital signs were monitored before and after the procedure. Patient was prepped and draped in the usual sterile fashion. The correct patient, procedure, and site was verified.   Injection Procedure Details:  Procedure Site One Meds Administered:  Meds ordered this encounter  Medications  . methylPREDNISolone acetate (DEPO-MEDROL) injection 40 mg     Laterality: Left  Location/Site:  L4-L5 L5-S1  Needle size: 18 G  Needle type: Radiofrequency cannula  Needle Placement: Along juncture of superior articular process and transverse pocess  Findings:  -Comments:  Procedure Details: For each desired target nerve, the corresponding transverse process (sacral ala for the L5 dorsal rami) was identified and the fluoroscope was positioned to square off the endplates of the corresponding vertebral body to achieve a true AP midline view.  The beam was then obliqued 15 to 20 degrees and caudally tilted 15 to 20 degrees to line up a trajectory along the target nerves. The skin over the target of the junction of superior articulating process and transverse process (sacral ala for the L5 dorsal rami) was infiltrated with 79ml of 1% Lidocaine without Epinephrine.  The 18 gauge 41mm active tip outer cannula was advanced in trajectory view to the target.  This procedure was repeated for each target nerve.  Then, for all levels, the outer cannula placement was fine-tuned and the position was then confirmed with bi-planar imaging.    Test stimulation was done both at sensory and motor levels to ensure there was no radicular stimulation. The target tissues were then infiltrated with 1 ml of 1% Lidocaine without  Epinephrine. Subsequently, a percutaneous neurotomy was carried out for 60 seconds at 80 degrees Celsius. The procedure was repeated with the cannula rotated 90 degrees, for duration of 60 seconds, one additional time at each level for a total of two lesions per level.  After the completion of the two lesions, 1 ml of injectate was delivered. It was then repeated for each facet joint nerve mentioned above. Appropriate radiographs were obtained to verify the probe placement during the neurotomy.   Additional Comments:  The patient tolerated the procedure well Dressing: Band-Aid    Post-procedure details: Patient was observed during the procedure. Post-procedure instructions were reviewed.  Patient left the clinic in stable condition.

## 2018-02-23 NOTE — Progress Notes (Signed)
Kristin Moon - 72 y.o. female MRN [id number]  Date of birth: [birth year]  Office Visit Note: Visit Date: 02/22/2018 PCP: Merrilee Seashore, MD Referred by: Merrilee Seashore, MD  Subjective: Chief Complaint  Patient presents with  . Lower Back - Pain  . Right Thigh - Pain  . Right Hip - Pain   HPI: Kristin Moon is a very pleasant 72 year old female with chronic history of back pain with facet joint osteoarthritis and arthropathy.  Please see our prior notes for further details and justification but patient is gone through double diagnostic blocks as well as physical therapy and medication management and time with continued long-term back pain from arthritis.  Some referral pattern to the right posterior thigh.  We have completed right-sided ablation.  We are going to complete the left-sided ablation now.  This is of the L4-5 and L5-S1 facet joints.  I will see her back in a few weeks and depending on relief at that point we would still consider possibly updating MRI if she does continue to have complaints.   ROS Otherwise per HPI.  Assessment & Plan: Visit Diagnoses:  1. Spondylosis without myelopathy or radiculopathy, lumbar region     Plan: No additional findings.   Meds & Orders:  Meds ordered this encounter  Medications  . methylPREDNISolone acetate (DEPO-MEDROL) injection 40 mg    Orders Placed This Encounter  Procedures  . Radiofrequency,Lumbar  . XR C-ARM NO REPORT    Follow-up: Return if symptoms worsen or fail to improve.   Procedures: No procedures performed  Lumbar Facet Joint Nerve Denervation  Patient: Kristin Moon      Date of Birth: [birth year] MRN: [id number] PCP: Merrilee Seashore, MD      Visit Date: 02/22/2018   Universal Protocol:    Date/Time: 09/17/195:48 AM  Consent Given By: the patient  Position: PRONE  Additional Comments: Vital signs were monitored before and after the procedure. Patient was prepped and draped in the usual sterile  fashion. The correct patient, procedure, and site was verified.   Injection Procedure Details:  Procedure Site One Meds Administered:  Meds ordered this encounter  Medications  . methylPREDNISolone acetate (DEPO-MEDROL) injection 40 mg     Laterality: Left  Location/Site:  L4-L5 L5-S1  Needle size: 18 G  Needle type: Radiofrequency cannula  Needle Placement: Along juncture of superior articular process and transverse pocess  Findings:  -Comments:  Procedure Details: For each desired target nerve, the corresponding transverse process (sacral ala for the L5 dorsal rami) was identified and the fluoroscope was positioned to square off the endplates of the corresponding vertebral body to achieve a true AP midline view.  The beam was then obliqued 15 to 20 degrees and caudally tilted 15 to 20 degrees to line up a trajectory along the target nerves. The skin over the target of the junction of superior articulating process and transverse process (sacral ala for the L5 dorsal rami) was infiltrated with 31ml of 1% Lidocaine without Epinephrine.  The 18 gauge 35mm active tip outer cannula was advanced in trajectory view to the target.  This procedure was repeated for each target nerve.  Then, for all levels, the outer cannula placement was fine-tuned and the position was then confirmed with bi-planar imaging.    Test stimulation was done both at sensory and motor levels to ensure there was no radicular stimulation. The target tissues were then infiltrated with 1 ml of 1% Lidocaine without Epinephrine. Subsequently, a percutaneous neurotomy was carried  out for 60 seconds at 80 degrees Celsius. The procedure was repeated with the cannula rotated 90 degrees, for duration of 60 seconds, one additional time at each level for a total of two lesions per level.  After the completion of the two lesions, 1 ml of injectate was delivered. It was then repeated for each facet joint nerve mentioned above.  Appropriate radiographs were obtained to verify the probe placement during the neurotomy.   Additional Comments:  The patient tolerated the procedure well Dressing: Band-Aid    Post-procedure details: Patient was observed during the procedure. Post-procedure instructions were reviewed.  Patient left the clinic in stable condition.      Clinical History: MRI LUMBAR SPINE WITHOUT CONTRAST 08/03/2008   Technique: Multiplanar and multiecho pulse sequences of the lumbar spine were obtained without intravenous contrast.   Comparison: None   Findings: Desiccation of the L4-L5 and L5-S1 disc. Shallow protrusion at L4-L5 with annular tear. No encroachment on the exiting nerve roots is appreciated on the parasagittal images.   The conus terminates at T12-L1 which is normal appearance. The paravertebral structures are unremarkable.   Axial imaging: The individual disc spaces are examined as follows:     L2-3: Mild facet degenerative changes.   L3-4: A disc bulge eccentric left. No encroachment on the exiting L3 root. No central stenosis. Mild facet degenerative changes.   L4-5: Disc bulge. Shallow central protrusion with annular tear. No mass effect on the exiting nerve roots. Mild facet degenerative changes.   L5-S1: Facet degenerative changes bilaterally without central or lateral recess stenosis.   IMPRESSION: Mild degenerative disc disease as described above most notably at the L4-L5 level.   Facet degenerative changes at L4-L5 and L5-S1. I question whether the patients symptoms may be facet mediated.   She reports that she has never smoked. She has never used smokeless tobacco. No results for input(s): HGBA1C, LABURIC in the last 8760 hours.  Objective:  VS:  HT:    WT:   BMI:     BP:(!) 126/48  HR:79bpm  TEMP:(!) 97.5 F (36.4 C)( )  RESP:  Physical Exam  Ortho Exam Imaging: Xr C-arm No Report  Result Date: 02/22/2018 Please see Notes tab for imaging  impression.   Past Medical/Family/Surgical/Social History: Medications & Allergies reviewed per EMR, new medications updated. Patient Active Problem List   Diagnosis Date Noted  . LBBB (left bundle branch block) 12/02/2012  . Chronic diastolic CHF (congestive heart failure) ([place])   . Transaminitis   . Non Hodgkin's lymphoma ([place])   . Hyperlipidemia   . SOB (shortness of breath)   . Hyperthyroidism    Past Medical History:  Diagnosis Date  . Chronic diastolic CHF (congestive heart failure) ([place])   . Diastolic heart failure    Grade 1 per echo May 2012  . Hyperlipidemia   . Hypertension   . Hypothyroidism   . Left bundle branch block   . Non Hodgkin's lymphoma ([place])    history  . Non Hodgkin's lymphoma ([place])   . S/P right and left heart catheterization May 2012   minimal CAD. EF of 50%  . Transaminitis    Family History  Problem Relation Age of Onset  . Heart attack Father 5  . Hypertension Father   . Heart disease Father   . Hypertension Mother    Past Surgical History:  Procedure Laterality Date  . ABDOMINAL HYSTERECTOMY  1983  . CARDIAC CATHETERIZATION  10/17/2010    Left ventriculography shows mild  global left ventricular hypokinesisThe LVEF is estimated at 50%.  Marland Kitchen FOOT FRACTURE SURGERY     Social History   Occupational History  . Occupation: substitute Product manager: [employer]  Tobacco Use  . Smoking status: Never Smoker  . Smokeless tobacco: Never Used  Substance and Sexual Activity  . Alcohol use: No  . Drug use: No  . Sexual activity: Not on file

## 2018-02-25 ENCOUNTER — Encounter: Payer: Self-pay | Admitting: Cardiovascular Disease

## 2018-02-25 ENCOUNTER — Ambulatory Visit: Payer: Medicare Other | Admitting: Podiatry

## 2018-02-25 ENCOUNTER — Ambulatory Visit: Payer: Medicare Other

## 2018-02-25 VITALS — BP 128/62 | HR 88

## 2018-02-25 DIAGNOSIS — M2042 Other hammer toe(s) (acquired), left foot: Secondary | ICD-10-CM | POA: Diagnosis not present

## 2018-02-25 DIAGNOSIS — L97521 Non-pressure chronic ulcer of other part of left foot limited to breakdown of skin: Secondary | ICD-10-CM | POA: Diagnosis not present

## 2018-02-25 DIAGNOSIS — M205X2 Other deformities of toe(s) (acquired), left foot: Secondary | ICD-10-CM | POA: Diagnosis not present

## 2018-02-25 DIAGNOSIS — L97529 Non-pressure chronic ulcer of other part of left foot with unspecified severity: Secondary | ICD-10-CM

## 2018-02-25 NOTE — Progress Notes (Signed)
Subjective:  Patient ID: Kristin Moon, female    DOB: 1946/06/07,  MRN: [id number]  Chief Complaint  Patient presents with  . Callouses    left foot 5th toe lateral side; pt stated, "been dealing with it for about 5mo, got my whole toe sore now; shave it but it keeps coming back"    72 y.o. female presents with the above complaint.  Reports painful area to the outside of the fifth toe on the left foot.  States she is been doing good with it for about 2 months.  Tried shaving it tried callus remover cream but it comes right back.   Review of Systems: Negative except as noted in the HPI. Denies N/V/F/Ch.  Past Medical History:  Diagnosis Date  . Chronic diastolic CHF (congestive heart failure) ([place])   . Diastolic heart failure    Grade 1 per echo May 2012  . Hyperlipidemia   . Hypertension   . Hypothyroidism   . Left bundle branch block   . Non Hodgkin's lymphoma ([place])    history  . Non Hodgkin's lymphoma ([place])   . S/P right and left heart catheterization May 2012   minimal CAD. EF of 50%  . Transaminitis     Current Outpatient Medications:  .  carvedilol (COREG) 6.25 MG tablet, TAKE 1 TABLET BY MOUTH 2 TIMES DAILY WITH A MEAL., Disp: 180 tablet, Rfl: 0 .  Cholecalciferol (VITAMIN D3) 2000 UNITS TABS, Take 1 tablet by mouth daily. , Disp: , Rfl:  .  estrogens conjugated, synthetic A, (CENESTIN) 0.625 MG tablet, Take 0.625 mg by mouth daily.  , Disp: , Rfl:  .  fluticasone (FLONASE) 50 MCG/ACT nasal spray, Place 1 spray into both nostrils as needed for allergies or rhinitis., Disp: , Rfl:  .  furosemide (LASIX) 20 MG tablet, Take 1 tablet (20 mg total) by mouth daily., Disp: 90 tablet, Rfl: 0 .  levothyroxine (SYNTHROID, LEVOTHROID) 75 MCG tablet, Take 75 mcg by mouth daily., Disp: , Rfl: 3 .  lisinopril (PRINIVIL,ZESTRIL) 2.5 MG tablet, TAKE 1 TABLET (2.5 MG TOTAL) BY MOUTH DAILY., Disp: 90 tablet, Rfl: 1 .  lovastatin (MEVACOR) 40 MG tablet, Take 40 mg by mouth daily.,  Disp: , Rfl:  .  omeprazole (PRILOSEC OTC) 20 MG tablet, Take 20 mg by mouth daily.  , Disp: , Rfl:  .  potassium chloride SA (KLOR-CON M20) 20 MEQ tablet, TAKE 1 TABLET (20 MEQ TOTAL) BY MOUTH 2 (TWO) TIMES DAILY., Disp: 180 tablet, Rfl: 0 .  aspirin EC 81 MG tablet, Take 1 tablet (81 mg total) by mouth daily., Disp: , Rfl:  .  cephALEXin (KEFLEX) 500 MG capsule, TAKE 1 CAPSULE BY MOUTH THREE TIMES A DAY FOR 10 DAYS, Disp: , Rfl: 0 .  doxycycline (VIBRAMYCIN) 100 MG capsule, Take 1 capsule (100 mg total) by mouth 2 (two) times daily. One po bid x 7 days, Disp: 14 capsule, Rfl: 0 .  erythromycin ophthalmic ointment, Place a 1/2 inch ribbon of ointment into the lower eyelid twice daily, Disp: 3.5 g, Rfl: 0 .  meloxicam (MOBIC) 15 MG tablet, Take 1 tablet (15 mg total) by mouth daily. Take with food, Disp: 30 tablet, Rfl: 1  Social History   Tobacco Use  Smoking Status Never Smoker  Smokeless Tobacco Never Used    Allergies  Allergen Reactions  . Metoprolol     fatigue   Objective:   Vitals:   02/25/18 1520  BP: 128/62  Pulse:  88   There is no height or weight on file to calculate BMI. Constitutional Well developed. Well nourished.  Vascular Dorsalis pedis pulses palpable bilaterally. Posterior tibial pulses palpable bilaterally. Capillary refill normal to all digits.  No cyanosis or clubbing noted. Pedal hair growth normal.  Neurologic Normal speech. Oriented to person, place, and time. Epicritic sensation to light touch grossly present bilaterally.  Dermatologic Nails well groomed and normal in appearance. No open wounds. Left fifth toe ulceration noted upon debridement.  0.2 x 0.2.  Orthopedic: Normal joint ROM without pain or crepitus bilaterally. Adductovarus deformity left fifth toe Pain palpation of the left fifth toe DIPJ   Radiographs: None today Assessment:   1. Skin ulcer of left foot, limited to breakdown of skin ([place])   2. Hammer toe of left foot   3.  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  Adductovarus left fifth toe with ulcer toe -Ulcer debrided as below dressed with medihoney and Band-Aid -Discussed with patient she may benefit from surgical correction of the hammertoe should pain persist. -Advised against self care of the lesion. -Discussed wider shoe gear and padding options  Procedure: Excisional Debridement of Wound Rationale: Removal of non-viable soft tissue from the wound to promote healing.  Anesthesia: none Pre-Debridement Wound Measurements: Overlying hyperkeratosis Post-Debridement Wound Measurements:  0.2 cm x 0.2 cm x 0.1 cm  Type of Debridement: Sharp Excisional Tissue Removed: Non-viable soft tissue Depth of Debridement: subcutaneous tissue. Technique: Sharp excisional debridement to bleeding, viable wound base.  Dressing: Dry, sterile, compression dressing. Disposition: Patient tolerated procedure well. Patient to return in 1 week for follow-up.  Return in about 2 weeks (around 03/11/2018) for Wound Care, Left.

## 2018-03-09 ENCOUNTER — Encounter: Payer: Self-pay | Admitting: Cardiovascular Disease

## 2018-03-09 ENCOUNTER — Ambulatory Visit: Payer: Medicare Other | Admitting: Cardiovascular Disease

## 2018-03-09 VITALS — BP 106/46 | HR 70 | Ht >= 80 in | Wt 164.4 lb

## 2018-03-09 DIAGNOSIS — I447 Left bundle-branch block, unspecified: Secondary | ICD-10-CM

## 2018-03-09 DIAGNOSIS — Z79899 Other long term (current) drug therapy: Secondary | ICD-10-CM | POA: Diagnosis not present

## 2018-03-09 DIAGNOSIS — I5032 Chronic diastolic (congestive) heart failure: Secondary | ICD-10-CM

## 2018-03-09 MED ORDER — SPIRONOLACTONE 25 MG PO TABS: 25.0000 mg | ORAL_TABLET | Freq: Every day | ORAL | 6 refills | Status: DC

## 2018-03-09 NOTE — Progress Notes (Signed)
Kristin Moon Date of Birth  12-06-1945 [facility] [address]    [place], [place]  [address] [phone]  Fax  [phone]   Problem List 1. Diastolic dysfunction. 2. intermittant LBBB - cath in 2012 - minimal CAD      72 year old female with a history of diastolic dysfunction. She has done well since I last saw her. She has not had any episodes of chest pain or shortness of breath.     December 02, 2012:  Kristin Moon is doing well.  She had some extra dyspnea in April, 2014.  She took and extra lasix and felt better.   She is getting some regular exercise.  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   December 14, 2014:  Kristin Moon is doing very well. I saw her last year. She increased her Lasix for about a month but did not feel any different. She resumed her previous Lasix dose and now is feeling quite well.  She denies any chest pain or short of breath. She is able to do her normal activities. 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   December 17, 2015: Doing well from a cardiac standpoint She does have some DOE   Aug. 31, 2018: Kristin Moon is seen today for follow-up of her diastolic dysfunction. 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   Rare episodes of DOE   March 09, 2018:  Kristin Moon is seen today for follow-up of her chronic diastolic congestive heart failure and left bundle branch block. Most recent echocardiogram was from July, 2017 which revealed left ventricular ejection fraction of 50 to 55%.  She has grade 2 diastolic dysfunction.  There is moderate aortic insufficiency, mild mitral regurgitation Had a bad URI this summer .    Current Outpatient Medications on File Prior to Visit  Medication Sig Dispense Refill  . carvedilol (COREG) 6.25 MG tablet TAKE 1 TABLET BY MOUTH 2 TIMES DAILY WITH A MEAL. 180 tablet 0  . Cholecalciferol (VITAMIN D3) 2000 UNITS TABS Take 1 tablet by mouth daily.     Marland Kitchen estrogens  conjugated, synthetic A, (CENESTIN) 0.625 MG tablet Take 0.625 mg by mouth daily.      . fluticasone (FLONASE) 50 MCG/ACT nasal spray Place 1 spray into both nostrils as needed for allergies or rhinitis.    . furosemide (LASIX) 20 MG tablet Take 1 tablet (20 mg total) by mouth daily. 90 tablet 0  . levothyroxine (SYNTHROID, LEVOTHROID) 75 MCG tablet Take 75 mcg by mouth daily.  3  . lisinopril (PRINIVIL,ZESTRIL) 2.5 MG tablet TAKE 1 TABLET (2.5 MG TOTAL) BY MOUTH DAILY. 90 tablet 1  . lovastatin (MEVACOR) 40 MG tablet Take 40 mg by mouth daily.    Marland Kitchen omeprazole (PRILOSEC OTC) 20 MG tablet Take 20 mg by mouth daily.      . potassium chloride SA (KLOR-CON M20) 20 MEQ tablet TAKE 1 TABLET (20 MEQ TOTAL) BY MOUTH 2 (TWO) TIMES DAILY. 180 tablet 0   No current facility-administered medications on file prior to visit.     Allergies  Allergen Reactions  . Metoprolol     fatigue    Past Medical History:  Diagnosis Date  . Chronic diastolic CHF (congestive heart failure) ([place])   . Diastolic heart failure    Grade 1 per echo May 2012  . Hyperlipidemia   . Hypertension   . Hypothyroidism   . Left bundle branch block   .  Non Hodgkin's lymphoma ([place])    history  . Non Hodgkin's lymphoma ([place])   . S/P right and left heart catheterization May 2012   minimal CAD. EF of 50%  . Transaminitis     Past Surgical History:  Procedure Laterality Date  . ABDOMINAL HYSTERECTOMY  1983  . CARDIAC CATHETERIZATION  10/17/2010    Left ventriculography shows mild global left ventricular hypokinesisThe LVEF is estimated at 50%.  Marland Kitchen FOOT FRACTURE SURGERY      Social History   Tobacco Use  Smoking Status Never Smoker  Smokeless Tobacco Never Used    Social History   Substance and Sexual Activity  Alcohol Use No    Family History  Problem Relation Age of Onset  . Heart attack Father 40  . Hypertension Father   . Heart disease Father   . Hypertension Mother     Reviw of Systems:  Reviewed in  the HPI.  All other systems are negative.  Physical Exam: Blood pressure (!) 106/46, pulse 70, height (!) 9' (2.743 m), weight 164 lb 6.4 oz (74.6 kg), head circumference 62" (157.5 cm), SpO2 97 %.  GEN:  Well nourished, well developed in no acute distress HEENT: Normal NECK: No JVD; No carotid bruits LYMPHATICS: No lymphadenopathy CARDIAC: RRR , no murmurs, rubs, gallops RESPIRATORY:  Clear to auscultation without rales, wheezing or rhonchi  ABDOMEN: Soft, non-tender, non-distended MUSCULOSKELETAL:  No edema; No deformity  SKIN: Warm and dry NEUROLOGIC:  Alert and oriented x 3   ECG: March 09, 2018: Normal sinus rhythm at 70.  Left bundle branch block.  No changes from previous EKG.  Assessment and Plan   1. Diastolic dysfunction. - Overall stable. Still occasionally has some DOE Will DC lasix and kdur and try aldactone 25 mg a day  Bmp in 3 weeks     2.  LBBB - stable   3. Dyspnea:      [name], MD  03/09/2018 10:28 AM    [facility] [facility] [address],  [place], [place]  [address] Pager [phone] Phone: [phone]; Fax: [phone]

## 2018-03-09 NOTE — Patient Instructions (Addendum)
Medication Instructions:  Your physician has recommended you make the following change in your medication:  1. STOP Lasix 2. STOP Potassium 3. START Aldactone 25 mg once daily  * If you need a refill on your cardiac medications before your next appointment, please call your pharmacy.   Labwork: Your physician recommends that you return for lab work in: 3 weeks for a BMET *We will only notify you of abnormal results, otherwise continue current treatment plan.  Testing/Procedures: None ordered  Follow-Up: Your physician wants you to follow-up in: 6 months with Dr. Acie Fredrickson.  You will receive a reminder letter in the mail two months in advance. If you don't receive a letter, please call our office to schedule the follow-up appointment.   Thank you for choosing [facility]!!     Any Other Special Instructions Will Be Listed Below (If Applicable).  Spironolactone tablets What is this medicine? SPIRONOLACTONE (speer on [place] LAK tone) is a diuretic. It helps you make more urine and to lose excess water from your body. This medicine is used to treat high blood pressure, and edema or swelling from heart, kidney, or liver disease. It is also used to treat patients who make too much aldosterone or have low potassium. This medicine may be used for other purposes; ask your health care provider or pharmacist if you have questions. COMMON BRAND NAME(S): Aldactone What should I tell my health care provider before I take this medicine? They need to know if you have any of these conditions: -high blood level of potassium -kidney disease or trouble making urine -liver disease -an unusual or allergic reaction to spironolactone, other medicines, foods, dyes, or preservatives -pregnant or trying to get pregnant -breast-feeding How should I use this medicine? Take this medicine by mouth with a drink of water. Follow the directions on your prescription label. You can take it with or without food. If  it upsets your stomach, take it with food. Do not take your medicine more often than directed. Remember that you will need to pass more urine after taking this medicine. Do not take your doses at a time of day that will cause you problems. Do not take at bedtime. Talk to your pediatrician regarding the use of this medicine in children. While this drug may be prescribed for selected conditions, precautions do apply. Overdosage: If you think you have taken too much of this medicine contact a poison control center or emergency room at once. NOTE: This medicine is only for you. Do not share this medicine with others. What if I miss a dose? If you miss a dose, take it as soon as you can. If it is almost time for your next dose, take only that dose. Do not take double or extra doses. What may interact with this medicine? Do not take this medicine with any of the following medications: -eplerenone This medicine may also interact with the following medications: -corticosteroids -digoxin -lithium -medicines for high blood pressure like ACE inhibitors -skeletal muscle relaxants like tubocurarine -NSAIDs, medicines for pain and inflammation, like ibuprofen or naproxen -potassium products like salt substitute or supplements -pressor amines like norepinephrine -some diuretics This list may not describe all possible interactions. Give your health care provider a list of all the medicines, herbs, non-prescription drugs, or dietary supplements you use. Also tell them if you smoke, drink alcohol, or use illegal drugs. Some items may interact with your medicine. What should I watch for while using this medicine? Visit your doctor or health  care professional for regular checks on your progress. Check your blood pressure as directed. Ask your doctor what your blood pressure should be, and when you should contact them. You may need to be on a special diet while taking this medicine. Ask your doctor. Also, ask how  many glasses of fluid you need to drink a day. You must not get dehydrated. This medicine may make you feel confused, dizzy or lightheaded. Drinking alcohol and taking some medicines can make this worse. Do not drive, use machinery, or do anything that needs mental alertness until you know how this medicine affects you. Do not sit or stand up quickly. What side effects may I notice from receiving this medicine? Side effects that you should report to your doctor or health care professional as soon as possible: -allergic reactions such as skin rash or itching, hives, swelling of the lips, mouth, tongue, or throat -black or tarry stools -fast, irregular heartbeat -fever -muscle pain, cramps -numbness, tingling in hands or feet -trouble breathing -trouble passing urine -unusual bleeding -unusually weak or tired Side effects that usually do not require medical attention (report to your doctor or health care professional if they continue or are bothersome): -change in voice or hair growth -confusion -dizzy, drowsy -dry mouth, increased thirst -enlarged or tender breasts -headache -irregular menstrual periods -sexual difficulty, unable to have an erection -stomach upset This list may not describe all possible side effects. Call your doctor for medical advice about side effects. You may report side effects to FDA at 1-800-FDA-1088. Where should I keep my medicine? Keep out of the reach of children. Store below 25 degrees C (77 degrees F). Throw away any unused medicine after the expiration date. NOTE: This sheet is a summary. It may not cover all possible information. If you have questions about this medicine, talk to your doctor, pharmacist, or health care provider.  2018 Elsevier/Gold Standard (2010-02-05 12:51:30)

## 2018-03-11 ENCOUNTER — Ambulatory Visit: Payer: Medicare Other | Admitting: Podiatry

## 2018-03-11 DIAGNOSIS — L02612 Cutaneous abscess of left foot: Secondary | ICD-10-CM

## 2018-03-11 DIAGNOSIS — M2042 Other hammer toe(s) (acquired), left foot: Secondary | ICD-10-CM

## 2018-03-11 DIAGNOSIS — M205X2 Other deformities of toe(s) (acquired), left foot: Secondary | ICD-10-CM

## 2018-03-11 DIAGNOSIS — L03032 Cellulitis of left toe: Secondary | ICD-10-CM

## 2018-03-11 DIAGNOSIS — L97521 Non-pressure chronic ulcer of other part of left foot limited to breakdown of skin: Secondary | ICD-10-CM

## 2018-03-11 MED ORDER — CEPHALEXIN 500 MG PO CAPS: 500.0000 mg | ORAL_CAPSULE | Freq: Two times a day (BID) | ORAL | 0 refills | Status: DC

## 2018-03-11 NOTE — Progress Notes (Signed)
Subjective:  Patient ID: Kristin Moon, female    DOB: [birth year],  MRN: [id number]  Chief Complaint  Patient presents with  . Foot Ulcer    left 5th toe, doing about the same - using Medihoney at home as instructed    72 y.o. female presents with the above complaint.  Using medihoney as directed.  States that has been about the same.   Review of Systems: Negative except as noted in the HPI. Denies N/V/F/Ch.  Past Medical History:  Diagnosis Date  . Chronic diastolic CHF (congestive heart failure) ([place])   . Diastolic heart failure    Grade 1 per echo May 2012  . Hyperlipidemia   . Hypertension   . Hypothyroidism   . Left bundle branch block   . Non Hodgkin's lymphoma ([place])    history  . Non Hodgkin's lymphoma ([place])   . S/P right and left heart catheterization May 2012   minimal CAD. EF of 50%  . Transaminitis     Current Outpatient Medications:  .  carvedilol (COREG) 6.25 MG tablet, TAKE 1 TABLET BY MOUTH 2 TIMES DAILY WITH A MEAL., Disp: 180 tablet, Rfl: 0 .  Cholecalciferol (VITAMIN D3) 2000 UNITS TABS, Take 1 tablet by mouth daily. , Disp: , Rfl:  .  estrogens conjugated, synthetic A, (CENESTIN) 0.625 MG tablet, Take 0.625 mg by mouth daily.  , Disp: , Rfl:  .  fluticasone (FLONASE) 50 MCG/ACT nasal spray, Place 1 spray into both nostrils as needed for allergies or rhinitis., Disp: , Rfl:  .  levothyroxine (SYNTHROID, LEVOTHROID) 75 MCG tablet, Take 75 mcg by mouth daily., Disp: , Rfl: 3 .  lisinopril (PRINIVIL,ZESTRIL) 2.5 MG tablet, TAKE 1 TABLET (2.5 MG TOTAL) BY MOUTH DAILY., Disp: 90 tablet, Rfl: 1 .  lovastatin (MEVACOR) 40 MG tablet, Take 40 mg by mouth daily., Disp: , Rfl:  .  omeprazole (PRILOSEC OTC) 20 MG tablet, Take 20 mg by mouth daily.  , Disp: , Rfl:  .  spironolactone (ALDACTONE) 25 MG tablet, Take 1 tablet (25 mg total) by mouth daily., Disp: 30 tablet, Rfl: 6  Social History   Tobacco Use  Smoking Status Never Smoker  Smokeless Tobacco Never  Used    Allergies  Allergen Reactions  . Metoprolol     fatigue   Objective:   There were no vitals filed for this visit. There is no height or weight on file to calculate BMI. Constitutional Well developed. Well nourished.  Vascular Dorsalis pedis pulses palpable bilaterally. Posterior tibial pulses palpable bilaterally. Capillary refill normal to all digits.  No cyanosis or clubbing noted. Pedal hair growth normal.  Neurologic Normal speech. Oriented to person, place, and time. Epicritic sensation to light touch grossly present bilaterally.  Dermatologic Nails well groomed and normal in appearance. No open wounds. Left fifth toe ulceration noted upon debridement. Slight purulence present upon debridement but no local warmth or erythema. 10/3: 0.2x0.2x0.1 post-debridement.  Orthopedic: Normal joint ROM without pain or crepitus bilaterally. Adductovarus deformity left fifth toe Pain palpation of the left fifth toe DIPJ   Radiographs: None today Assessment:   1. Skin ulcer of left foot, limited to breakdown of skin ([place])   2. Hammer toe of left foot   3. 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  Adductovarus left fifth toe with ulcer toe -Ulcer debrided as below -Dressed with medihoney and DSD -Advised surgical  shoe for offloading -Rx for Keflex due to purulence upon debridement today.  Likely just local collection as the third toe does not appear acutely infected. -Should ulceration continue unchanged would reconsider correction of the hammertoe deformity.  Discussed with patient that I do think she might benefit from surgery despite there being increased risk.  Procedure: Excisional Debridement of Wound Rationale: Removal of non-viable soft tissue from the wound to promote healing.  Anesthesia: none Pre-Debridement Wound Measurements: overlying hyperkeratosis   Post-Debridement Wound  Measurements: 0.2 cm x 0.2 cm x 0.1 cm  Type of Debridement: Sharp Excisional Tissue Removed: Non-viable soft tissue Depth of Debridement: subcutaneous tissue. Technique: Sharp excisional debridement to bleeding, viable wound base.  Dressing: Dry, sterile, compression dressing. Disposition: Patient tolerated procedure well. Patient to return in 1 week for follow-up.     Return in about 4 weeks (around 04/08/2018) for Wound Care, Left 5th toe.

## 2018-03-12 ENCOUNTER — Telehealth: Payer: Self-pay | Admitting: *Deleted

## 2018-03-12 NOTE — Telephone Encounter (Signed)
-----   Message from Kristin Moon, DPM sent at 03/11/2018 10:24 PM EDT ----- Can we call patient to let her know her Rx is at her pharmacy? It didn't get sent today when she was seen.

## 2018-03-12 NOTE — Telephone Encounter (Signed)
Left message on pt's home phone that the medication had been sent to the pharmacy.

## 2018-03-25 ENCOUNTER — Ambulatory Visit: Payer: Medicare Other | Admitting: Podiatry

## 2018-03-25 ENCOUNTER — Encounter: Payer: Self-pay | Admitting: Podiatry

## 2018-03-25 DIAGNOSIS — M205X2 Other deformities of toe(s) (acquired), left foot: Secondary | ICD-10-CM

## 2018-03-25 DIAGNOSIS — L97521 Non-pressure chronic ulcer of other part of left foot limited to breakdown of skin: Secondary | ICD-10-CM | POA: Diagnosis not present

## 2018-03-25 DIAGNOSIS — M2042 Other hammer toe(s) (acquired), left foot: Secondary | ICD-10-CM

## 2018-03-25 NOTE — Progress Notes (Signed)
Subjective:  Patient ID: Kristin Moon, female    DOB: 13-Dec-1945,  MRN: [id number]  Chief Complaint  Patient presents with  . Foot Ulcer    left foot 5th digit; pt stated, "doing better"    72 y.o. female presents with the above complaint.  States the area is doing better.  Review of Systems: Negative except as noted in the HPI. Denies N/V/F/Ch.  Past Medical History:  Diagnosis Date  . Chronic diastolic CHF (congestive heart failure) ([place])   . Diastolic heart failure    Grade 1 per echo May 2012  . Hyperlipidemia   . Hypertension   . Hypothyroidism   . Left bundle branch block   . Non Hodgkin's lymphoma ([place])    history  . Non Hodgkin's lymphoma ([place])   . S/P right and left heart catheterization May 2012   minimal CAD. EF of 50%  . Transaminitis     Current Outpatient Medications:  .  carvedilol (COREG) 6.25 MG tablet, TAKE 1 TABLET BY MOUTH 2 TIMES DAILY WITH A MEAL., Disp: 180 tablet, Rfl: 0 .  cephALEXin (KEFLEX) 500 MG capsule, Take 1 capsule (500 mg total) by mouth 2 (two) times daily., Disp: 14 capsule, Rfl: 0 .  Cholecalciferol (VITAMIN D3) 2000 UNITS TABS, Take 1 tablet by mouth daily. , Disp: , Rfl:  .  estrogens conjugated, synthetic A, (CENESTIN) 0.625 MG tablet, Take 0.625 mg by mouth daily.  , Disp: , Rfl:  .  fluticasone (FLONASE) 50 MCG/ACT nasal spray, Place 1 spray into both nostrils as needed for allergies or rhinitis., Disp: , Rfl:  .  levothyroxine (SYNTHROID, LEVOTHROID) 75 MCG tablet, Take 75 mcg by mouth daily., Disp: , Rfl: 3 .  lisinopril (PRINIVIL,ZESTRIL) 2.5 MG tablet, TAKE 1 TABLET (2.5 MG TOTAL) BY MOUTH DAILY., Disp: 90 tablet, Rfl: 1 .  lovastatin (MEVACOR) 40 MG tablet, Take 40 mg by mouth daily., Disp: , Rfl:  .  omeprazole (PRILOSEC OTC) 20 MG tablet, Take 20 mg by mouth daily.  , Disp: , Rfl:  .  spironolactone (ALDACTONE) 25 MG tablet, Take 1 tablet (25 mg total) by mouth daily., Disp: 30 tablet, Rfl: 6  Social History   Tobacco  Use  Smoking Status Never Smoker  Smokeless Tobacco Never Used    Allergies  Allergen Reactions  . Metoprolol     fatigue   Objective:   There were no vitals filed for this visit. There is no height or weight on file to calculate BMI. Constitutional Well developed. Well nourished.  Vascular Dorsalis pedis pulses palpable bilaterally. Posterior tibial pulses palpable bilaterally. Capillary refill normal to all digits.  No cyanosis or clubbing noted. Pedal hair growth normal.  Neurologic Normal speech. Oriented to person, place, and time. Epicritic sensation to light touch grossly present bilaterally.  Dermatologic Nails well groomed and normal in appearance. No open wounds. Left fifth toe with epithelialized wound.  Orthopedic: Normal joint ROM without pain or crepitus bilaterally. Adductovarus deformity left fifth toe Pain palpation of the left fifth toe DIPJ   Radiographs: None today Assessment:   1. Skin ulcer of left foot, limited to breakdown of skin ([place])   2. Hammer toe of left foot   3. 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  Adductovarus left fifth toe with ulcer toe -Continue surgical shoe for offloading -Wound epithelialized today. -Advised to dress the area if the  wound open up again. -Should ulceration continue unchanged would reconsider correction of the hammertoe deformity.   Return in about 3 weeks (around 04/15/2018) for Wound Care, Left.

## 2018-03-30 ENCOUNTER — Other Ambulatory Visit: Payer: Medicare Other

## 2018-03-30 DIAGNOSIS — I5032 Chronic diastolic (congestive) heart failure: Secondary | ICD-10-CM

## 2018-03-30 DIAGNOSIS — Z79899 Other long term (current) drug therapy: Secondary | ICD-10-CM

## 2018-03-31 LAB — BASIC METABOLIC PANEL
BUN/Creatinine Ratio: 17 (ref 12–28)
BUN: 16 mg/dL (ref 8–27)
CO2: 23 mmol/L (ref 20–29)
Calcium: 10 mg/dL (ref 8.7–10.3)
Chloride: 102 mmol/L (ref 96–106)
Creatinine, Ser: 0.93 mg/dL (ref 0.57–1.00)
GFR, EST AFRICAN AMERICAN: 72 mL/min/{1.73_m2} (ref >59)
GFR, EST NON AFRICAN AMERICAN: 62 mL/min/{1.73_m2} (ref >59)
Glucose: 88 mg/dL (ref 65–99)
POTASSIUM: 4.5 mmol/L (ref 3.5–5.2)
SODIUM: 139 mmol/L (ref 134–144)

## 2018-04-12 ENCOUNTER — Telehealth: Payer: Self-pay | Admitting: Cardiovascular Disease

## 2018-04-12 DIAGNOSIS — I5032 Chronic diastolic (congestive) heart failure: Secondary | ICD-10-CM

## 2018-04-12 MED ORDER — FUROSEMIDE 20 MG PO TABS: 20.0000 mg | ORAL_TABLET | Freq: Every day | ORAL | 3 refills | Status: AC

## 2018-04-12 MED ORDER — POTASSIUM CHLORIDE CRYS ER 20 MEQ PO TBCR: 20.0000 meq | EXTENDED_RELEASE_TABLET | Freq: Every day | ORAL | 3 refills | Status: AC

## 2018-04-12 NOTE — Telephone Encounter (Signed)
Called patient who states she is not having good urinary output since switching from Furosemide to Aldactone. States she also has gained weight since making the switch. States she continues to have SOB with strenuous activity or fast walking which was one complaint at ov on 10/1 and likely the reason Dr. Acie Fredrickson changed her medications. States she thinks she had less SOB on Furosemide. She is not monitoring BP at home. I advised I will discuss with Dr. Acie Fredrickson and call her back with his advice. She verbalized understanding and agreement and thanked me for the call.

## 2018-04-12 NOTE — Telephone Encounter (Signed)
New Message        Patient would like to speak to someone concerning her medication. Patient states meds are not working like they should. Pls call and advise.

## 2018-04-12 NOTE — Telephone Encounter (Signed)
Advised patient that Dr. Acie Fredrickson would like her to d/c spironolactone and restart lasix 20 mg and kdur 20 meq once daily I scheduled her for lab appointment on 12/4 and advised her to call back with questions or concerns. She verbalized understanding and agreement and thanked me for the call.

## 2018-04-13 ENCOUNTER — Ambulatory Visit: Payer: Medicare Other | Admitting: Podiatry

## 2018-04-14 ENCOUNTER — Telehealth (INDEPENDENT_AMBULATORY_CARE_PROVIDER_SITE_OTHER): Payer: Self-pay | Admitting: Physical Medicine and Rehabilitation

## 2018-04-14 NOTE — Telephone Encounter (Signed)
ov

## 2018-04-14 NOTE — Telephone Encounter (Signed)
Scheduled for an OV 04/23/18 at 0930.

## 2018-04-19 ENCOUNTER — Encounter: Payer: Self-pay | Admitting: Podiatry

## 2018-04-19 ENCOUNTER — Ambulatory Visit: Payer: Medicare Other | Admitting: Podiatry

## 2018-04-19 VITALS — BP 114/54 | HR 74 | Temp 96.5°F | Resp 16

## 2018-04-19 DIAGNOSIS — M2042 Other hammer toe(s) (acquired), left foot: Secondary | ICD-10-CM | POA: Diagnosis not present

## 2018-04-19 DIAGNOSIS — L97521 Non-pressure chronic ulcer of other part of left foot limited to breakdown of skin: Secondary | ICD-10-CM | POA: Diagnosis not present

## 2018-04-19 DIAGNOSIS — M205X2 Other deformities of toe(s) (acquired), left foot: Secondary | ICD-10-CM

## 2018-04-19 NOTE — Progress Notes (Signed)
Subjective:  Patient ID: Kristin Moon, female    DOB: 1945-11-23,  MRN: [id number]  Chief Complaint  Patient presents with  . Foot Ulcer    F/U L 5th toe Pt. states,"It's doing good. I wore my regular shoes since Sat and I've been doing just fine w/ them. no pain at all." tx: none -pt denies drainage/redness/swellling/N/V/FCh     72 y.o. female presents with the above complaint. History as above.  Review of Systems: Negative except as noted in the HPI. Denies N/V/F/Ch.  Past Medical History:  Diagnosis Date  . Chronic diastolic CHF (congestive heart failure) ([place])   . Diastolic heart failure    Grade 1 per echo May 2012  . Hyperlipidemia   . Hypertension   . Hypothyroidism   . Left bundle branch block   . Non Hodgkin's lymphoma ([place])    history  . Non Hodgkin's lymphoma ([place])   . S/P right and left heart catheterization May 2012   minimal CAD. EF of 50%  . Transaminitis     Current Outpatient Medications:  .  carvedilol (COREG) 6.25 MG tablet, TAKE 1 TABLET BY MOUTH 2 TIMES DAILY WITH A MEAL., Disp: 180 tablet, Rfl: 0 .  Cholecalciferol (VITAMIN D3) 2000 UNITS TABS, Take 1 tablet by mouth daily. , Disp: , Rfl:  .  estrogens conjugated, synthetic A, (CENESTIN) 0.625 MG tablet, Take 0.625 mg by mouth daily.  , Disp: , Rfl:  .  fluticasone (FLONASE) 50 MCG/ACT nasal spray, Place 1 spray into both nostrils as needed for allergies or rhinitis., Disp: , Rfl:  .  furosemide (LASIX) 20 MG tablet, Take 1 tablet (20 mg total) by mouth daily., Disp: 90 tablet, Rfl: 3 .  levothyroxine (SYNTHROID, LEVOTHROID) 75 MCG tablet, Take 75 mcg by mouth daily., Disp: , Rfl: 3 .  lisinopril (PRINIVIL,ZESTRIL) 2.5 MG tablet, TAKE 1 TABLET (2.5 MG TOTAL) BY MOUTH DAILY., Disp: 90 tablet, Rfl: 1 .  lovastatin (MEVACOR) 40 MG tablet, Take 40 mg by mouth daily., Disp: , Rfl:  .  omeprazole (PRILOSEC OTC) 20 MG tablet, Take 20 mg by mouth daily.  , Disp: , Rfl:  .  potassium chloride SA  (K-DUR,KLOR-CON) 20 MEQ tablet, Take 1 tablet (20 mEq total) by mouth daily., Disp: 90 tablet, Rfl: 3  Social History   Tobacco Use  Smoking Status Never Smoker  Smokeless Tobacco Never Used    Allergies  Allergen Reactions  . Metoprolol     fatigue   Objective:   Vitals:   04/19/18 0858  BP: (!) 114/54  Pulse: 74  Resp: 16  Temp: (!) 96.5 F (35.8 C)   There is no height or weight on file to calculate BMI. Constitutional Well developed. Well nourished.  Vascular Dorsalis pedis pulses palpable bilaterally. Posterior tibial pulses palpable bilaterally. Capillary refill normal to all digits.  No cyanosis or clubbing noted. Pedal hair growth normal.  Neurologic Normal speech. Oriented to person, place, and time. Epicritic sensation to light touch grossly present bilaterally.  Dermatologic Nails well groomed and normal in appearance. No open wounds. Left fifth toe healed  Orthopedic: Normal joint ROM without pain or crepitus bilaterally. Adductovarus deformity left fifth toe Pain palpation of the left fifth toe DIPJ   Radiographs: None today Assessment:   1. Skin ulcer of left foot, limited to breakdown of skin ([place])   2. Hammer toe of left foot   3. 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  Adductovarus left fifth toe with hx ulcer toe -Continue normal shoegear. No recurrence noted. -Discussed possible surgical correction in the future should it recur. -Discussed signs of recurrence and to return sooner should they occur.  Return in about 6 weeks (around 05/31/2018) for Hammertoe f/u L 5th toe.

## 2018-04-23 ENCOUNTER — Encounter (INDEPENDENT_AMBULATORY_CARE_PROVIDER_SITE_OTHER): Payer: Self-pay | Admitting: Physical Medicine and Rehabilitation

## 2018-04-23 ENCOUNTER — Ambulatory Visit (INDEPENDENT_AMBULATORY_CARE_PROVIDER_SITE_OTHER): Payer: Medicare Other | Admitting: Physical Medicine and Rehabilitation

## 2018-04-23 VITALS — BP 121/52 | HR 70 | Ht 62.0 in | Wt 165.2 lb

## 2018-04-23 DIAGNOSIS — M5416 Radiculopathy, lumbar region: Secondary | ICD-10-CM

## 2018-04-23 NOTE — Progress Notes (Signed)
Pt is here for an OV and states pain in right buttocks and radiates into the right leg and goes all the way down to the right ankle. Pt states pain started 2 months ago. Pt states sitting and laying down makes pain worse, ibuprofen helps with pain.   .Numeric Pain Rating Scale and Functional Assessment Average Pain 8 Pain Right Now 5 My pain is intermittent, sharp and stabbing Pain is worse with: sitting Pain improves with: medication   In the last MONTH (on 0-10 scale) has pain interfered with the following?  1. General activity like being  able to carry out your everyday physical activities such as walking, climbing stairs, carrying groceries, or moving a chair?  Rating(7)  2. Relation with others like being able to carry out your usual social activities and roles such as  activities at home, at work and in your community. Rating(7)  3. Enjoyment of life such that you have  been bothered by emotional problems such as feeling anxious, depressed or irritable?  Rating(4)

## 2018-05-03 ENCOUNTER — Encounter (INDEPENDENT_AMBULATORY_CARE_PROVIDER_SITE_OTHER): Payer: Self-pay | Admitting: Physical Medicine and Rehabilitation

## 2018-05-03 ENCOUNTER — Ambulatory Visit (INDEPENDENT_AMBULATORY_CARE_PROVIDER_SITE_OTHER): Payer: Self-pay

## 2018-05-03 ENCOUNTER — Ambulatory Visit (INDEPENDENT_AMBULATORY_CARE_PROVIDER_SITE_OTHER): Payer: Medicare Other | Admitting: Physical Medicine and Rehabilitation

## 2018-05-03 VITALS — BP 137/57 | HR 80 | Temp 97.6°F

## 2018-05-03 DIAGNOSIS — M5416 Radiculopathy, lumbar region: Secondary | ICD-10-CM | POA: Diagnosis not present

## 2018-05-03 MED ORDER — METHYLPREDNISOLONE ACETATE 80 MG/ML IJ SUSP
80.0000 mg | Freq: Once | INTRAMUSCULAR | Status: AC
  Administered 2018-05-03: 80 mg

## 2018-05-03 NOTE — Procedures (Signed)
Lumbosacral Transforaminal Epidural Steroid Injection - Sub-Pedicular Approach with Fluoroscopic Guidance  Patient: Kristin Moon      Date of Birth: 31-Dec-1945 MRN: [id number] PCP: Merrilee Seashore, MD      Visit Date: 05/03/2018   Universal Protocol:    Date/Time: 05/03/2018  Consent Given By: the patient  Position: PRONE  Additional Comments: Vital signs were monitored before and after the procedure. Patient was prepped and draped in the usual sterile fashion. The correct patient, procedure, and site was verified.   Injection Procedure Details:  Procedure Site One Meds Administered:  Meds ordered this encounter  Medications  . methylPREDNISolone acetate (DEPO-MEDROL) injection 80 mg    Laterality: Right  Location/Site:  L5-S1  Needle size: 22 G  Needle type: Spinal  Needle Placement: Transforaminal  Findings:    -Comments: Excellent flow of contrast along the nerve and into the epidural space.  Procedure Details: After squaring off the end-plates to get a true AP view, the C-arm was positioned so that an oblique view of the foramen as noted above was visualized. The target area is just inferior to the "nose of the scotty dog" or sub pedicular. The soft tissues overlying this structure were infiltrated with 2-3 ml. of 1% Lidocaine without Epinephrine.  The spinal needle was inserted toward the target using a "trajectory" view along the fluoroscope beam.  Under AP and lateral visualization, the needle was advanced so it did not puncture dura and was located close the 6 O'Clock position of the pedical in AP tracterory. Biplanar projections were used to confirm position. Aspiration was confirmed to be negative for CSF and/or blood. A 1-2 ml. volume of Isovue-250 was injected and flow of contrast was noted at each level. Radiographs were obtained for documentation purposes.   After attaining the desired flow of contrast documented above, a 0.5 to 1.0 ml test dose of  0.25% Marcaine was injected into each respective transforaminal space.  The patient was observed for 90 seconds post injection.  After no sensory deficits were reported, and normal lower extremity motor function was noted,   the above injectate was administered so that equal amounts of the injectate were placed at each foramen (level) into the transforaminal epidural space.   Additional Comments:  The patient tolerated the procedure well Dressing: Band-Aid    Post-procedure details: Patient was observed during the procedure. Post-procedure instructions were reviewed.  Patient left the clinic in stable condition.

## 2018-05-03 NOTE — Progress Notes (Signed)
TYKESHIA TOURANGEAU - 72 y.o. female MRN [id number]  Date of birth: 1945/07/27  Office Visit Note: Visit Date: 05/03/2018 PCP: Merrilee Seashore, MD Referred by: Merrilee Seashore, MD  Subjective: Chief Complaint  Patient presents with  . Lower Back - Pain  . Right Leg - Pain   HPI:  Kristin Moon is a 72 y.o. female who comes in today For planned right L5 transforaminal epidural steroid injection.  Please see our prior evaluation and management note for further details and justification.  ROS Otherwise per HPI.  Assessment & Plan: Visit Diagnoses:  1. Lumbar radiculopathy     Plan: No additional findings.   Meds & Orders:  Meds ordered this encounter  Medications  . methylPREDNISolone acetate (DEPO-MEDROL) injection 80 mg    Orders Placed This Encounter  Procedures  . XR C-ARM NO REPORT  . Epidural Steroid injection    Follow-up: No follow-ups on file.   Procedures: No procedures performed  Lumbosacral Transforaminal Epidural Steroid Injection - Sub-Pedicular Approach with Fluoroscopic Guidance  Patient: Kristin Moon      Date of Birth: 09-28-1945 MRN: [id number] PCP: Merrilee Seashore, MD      Visit Date: 05/03/2018   Universal Protocol:    Date/Time: 05/03/2018  Consent Given By: the patient  Position: PRONE  Additional Comments: Vital signs were monitored before and after the procedure. Patient was prepped and draped in the usual sterile fashion. The correct patient, procedure, and site was verified.   Injection Procedure Details:  Procedure Site One Meds Administered:  Meds ordered this encounter  Medications  . methylPREDNISolone acetate (DEPO-MEDROL) injection 80 mg    Laterality: Right  Location/Site:  L5-S1  Needle size: 22 G  Needle type: Spinal  Needle Placement: Transforaminal  Findings:    -Comments: Excellent flow of contrast along the nerve and into the epidural space.  Procedure Details: After squaring off the  end-plates to get a true AP view, the C-arm was positioned so that an oblique view of the foramen as noted above was visualized. The target area is just inferior to the "nose of the scotty dog" or sub pedicular. The soft tissues overlying this structure were infiltrated with 2-3 ml. of 1% Lidocaine without Epinephrine.  The spinal needle was inserted toward the target using a "trajectory" view along the fluoroscope beam.  Under AP and lateral visualization, the needle was advanced so it did not puncture dura and was located close the 6 O'Clock position of the pedical in AP tracterory. Biplanar projections were used to confirm position. Aspiration was confirmed to be negative for CSF and/or blood. A 1-2 ml. volume of Isovue-250 was injected and flow of contrast was noted at each level. Radiographs were obtained for documentation purposes.   After attaining the desired flow of contrast documented above, a 0.5 to 1.0 ml test dose of 0.25% Marcaine was injected into each respective transforaminal space.  The patient was observed for 90 seconds post injection.  After no sensory deficits were reported, and normal lower extremity motor function was noted,   the above injectate was administered so that equal amounts of the injectate were placed at each foramen (level) into the transforaminal epidural space.   Additional Comments:  The patient tolerated the procedure well Dressing: Band-Aid    Post-procedure details: Patient was observed during the procedure. Post-procedure instructions were reviewed.  Patient left the clinic in stable condition.     Clinical History: MRI LUMBAR SPINE WITHOUT CONTRAST 08/03/2008   Technique:  Multiplanar and multiecho pulse sequences of the lumbar spine were obtained without intravenous contrast.   Comparison: None   Findings: Desiccation of the L4-L5 and L5-S1 disc. Shallow protrusion at L4-L5 with annular tear. No encroachment on the exiting nerve roots is  appreciated on the parasagittal images.   The conus terminates at T12-L1 which is normal appearance. The paravertebral structures are unremarkable.   Axial imaging: The individual disc spaces are examined as follows:     L2-3: Mild facet degenerative changes.   L3-4: A disc bulge eccentric left. No encroachment on the exiting L3 root. No central stenosis. Mild facet degenerative changes.   L4-5: Disc bulge. Shallow central protrusion with annular tear. No mass effect on the exiting nerve roots. Mild facet degenerative changes.   L5-S1: Facet degenerative changes bilaterally without central or lateral recess stenosis.   IMPRESSION: Mild degenerative disc disease as described above most notably at the L4-L5 level.   Facet degenerative changes at L4-L5 and L5-S1. I question whether the patients symptoms may be facet mediated.     Objective:  VS:  HT:    WT:   BMI:     BP:(!) 137/57  HR:80bpm  TEMP:97.6 F (36.4 C)(Oral)  RESP:  Physical Exam  Ortho Exam Imaging: Xr C-arm No Report  Result Date: 05/03/2018 Please see Notes tab for imaging impression.

## 2018-05-03 NOTE — Progress Notes (Signed)
 .  Numeric Pain Rating Scale and Functional Assessment Average Pain 8   In the last MONTH (on 0-10 scale) has pain interfered with the following?  1. General activity like being  able to carry out your everyday physical activities such as walking, climbing stairs, carrying groceries, or moving a chair?  Rating(6)   +Driver, -BT, -Dye Allergies.  

## 2018-05-03 NOTE — Patient Instructions (Signed)

## 2018-05-05 DIAGNOSIS — D508 Other iron deficiency anemias: Secondary | ICD-10-CM | POA: Diagnosis not present

## 2018-05-05 DIAGNOSIS — E782 Mixed hyperlipidemia: Secondary | ICD-10-CM | POA: Diagnosis not present

## 2018-05-05 DIAGNOSIS — Z79899 Other long term (current) drug therapy: Secondary | ICD-10-CM | POA: Diagnosis not present

## 2018-05-05 DIAGNOSIS — I5032 Chronic diastolic (congestive) heart failure: Secondary | ICD-10-CM | POA: Diagnosis not present

## 2018-05-05 DIAGNOSIS — E039 Hypothyroidism, unspecified: Secondary | ICD-10-CM | POA: Diagnosis not present

## 2018-05-05 DIAGNOSIS — Z0000 Encounter for general adult medical examination without abnormal findings: Secondary | ICD-10-CM | POA: Diagnosis not present

## 2018-05-05 DIAGNOSIS — I10 Essential (primary) hypertension: Secondary | ICD-10-CM | POA: Diagnosis not present

## 2018-05-05 DIAGNOSIS — R7989 Other specified abnormal findings of blood chemistry: Secondary | ICD-10-CM | POA: Diagnosis not present

## 2018-05-12 ENCOUNTER — Other Ambulatory Visit: Payer: Medicare Other | Admitting: *Deleted

## 2018-05-12 DIAGNOSIS — I5032 Chronic diastolic (congestive) heart failure: Secondary | ICD-10-CM | POA: Diagnosis not present

## 2018-05-12 LAB — BASIC METABOLIC PANEL
BUN/Creatinine Ratio: 24 (ref 12–28)
BUN: 23 mg/dL (ref 8–27)
CALCIUM: 10.5 mg/dL — AB (ref 8.7–10.3)
CHLORIDE: 101 mmol/L (ref 96–106)
CO2: 21 mmol/L (ref 20–29)
Creatinine, Ser: 0.95 mg/dL (ref 0.57–1.00)
GFR calc non Af Amer: 61 mL/min/{1.73_m2} (ref >59)
GFR, EST AFRICAN AMERICAN: 70 mL/min/{1.73_m2} (ref >59)
Glucose: 139 mg/dL — ABNORMAL HIGH (ref 65–99)
POTASSIUM: 4.1 mmol/L (ref 3.5–5.2)
Sodium: 138 mmol/L (ref 134–144)

## 2018-05-25 ENCOUNTER — Telehealth (INDEPENDENT_AMBULATORY_CARE_PROVIDER_SITE_OTHER): Payer: Self-pay | Admitting: Physical Medicine and Rehabilitation

## 2018-05-25 DIAGNOSIS — M5416 Radiculopathy, lumbar region: Secondary | ICD-10-CM

## 2018-05-26 NOTE — Telephone Encounter (Signed)
MRI ordered and patient notified.

## 2018-05-26 NOTE — Telephone Encounter (Signed)
MRI Lspine for right radic

## 2018-05-31 DIAGNOSIS — H43813 Vitreous degeneration, bilateral: Secondary | ICD-10-CM | POA: Diagnosis not present

## 2018-05-31 DIAGNOSIS — H35033 Hypertensive retinopathy, bilateral: Secondary | ICD-10-CM | POA: Diagnosis not present

## 2018-05-31 DIAGNOSIS — H25013 Cortical age-related cataract, bilateral: Secondary | ICD-10-CM | POA: Diagnosis not present

## 2018-05-31 DIAGNOSIS — H2513 Age-related nuclear cataract, bilateral: Secondary | ICD-10-CM | POA: Diagnosis not present

## 2018-06-07 ENCOUNTER — Encounter: Payer: Self-pay | Admitting: Podiatry

## 2018-06-07 ENCOUNTER — Ambulatory Visit: Payer: Medicare Other | Admitting: Podiatry

## 2018-06-07 ENCOUNTER — Telehealth (INDEPENDENT_AMBULATORY_CARE_PROVIDER_SITE_OTHER): Payer: Self-pay | Admitting: Physical Medicine and Rehabilitation

## 2018-06-07 ENCOUNTER — Ambulatory Visit
Admission: RE | Admit: 2018-06-07 | Discharge: 2018-06-07 | Disposition: A | Discharge status: Home / Self Care | Payer: Medicare Other | Source: Ambulatory Visit | Attending: Physical Medicine and Rehabilitation | Admitting: Physical Medicine and Rehabilitation

## 2018-06-07 DIAGNOSIS — M2042 Other hammer toe(s) (acquired), left foot: Secondary | ICD-10-CM | POA: Diagnosis not present

## 2018-06-07 DIAGNOSIS — L97521 Non-pressure chronic ulcer of other part of left foot limited to breakdown of skin: Secondary | ICD-10-CM

## 2018-06-07 DIAGNOSIS — M205X2 Other deformities of toe(s) (acquired), left foot: Secondary | ICD-10-CM | POA: Diagnosis not present

## 2018-06-07 DIAGNOSIS — M48061 Spinal stenosis, lumbar region without neurogenic claudication: Secondary | ICD-10-CM | POA: Diagnosis not present

## 2018-06-07 DIAGNOSIS — M5416 Radiculopathy, lumbar region: Secondary | ICD-10-CM

## 2018-06-07 NOTE — Progress Notes (Signed)
Subjective:  Patient ID: Kristin Moon, female    DOB: 1946-03-31,  MRN: [id number]  Chief Complaint  Patient presents with  . Callouses    i have a corn on the left 5th toe that does hurt some   . Foot Problem    my left foot does swell a little    72 y.o. female presents with the above complaint. History as above.  Review of Systems: Negative except as noted in the HPI. Denies N/V/F/Ch.  Past Medical History:  Diagnosis Date  . Chronic diastolic CHF (congestive heart failure) ([place])   . Diastolic heart failure    Grade 1 per echo May 2012  . Hyperlipidemia   . Hypertension   . Hypothyroidism   . Left bundle branch block   . Non Hodgkin's lymphoma ([place])    history  . Non Hodgkin's lymphoma ([place])   . S/P right and left heart catheterization May 2012   minimal CAD. EF of 50%  . Transaminitis     Current Outpatient Medications:  .  carvedilol (COREG) 6.25 MG tablet, TAKE 1 TABLET BY MOUTH 2 TIMES DAILY WITH A MEAL., Disp: 180 tablet, Rfl: 0 .  Cholecalciferol (VITAMIN D3) 2000 UNITS TABS, Take 1 tablet by mouth daily. , Disp: , Rfl:  .  estrogens conjugated, synthetic A, (CENESTIN) 0.625 MG tablet, Take 0.625 mg by mouth daily.  , Disp: , Rfl:  .  fluticasone (FLONASE) 50 MCG/ACT nasal spray, Place 1 spray into both nostrils as needed for allergies or rhinitis., Disp: , Rfl:  .  furosemide (LASIX) 20 MG tablet, Take 1 tablet (20 mg total) by mouth daily., Disp: 90 tablet, Rfl: 3 .  levothyroxine (SYNTHROID, LEVOTHROID) 75 MCG tablet, Take 75 mcg by mouth daily., Disp: , Rfl: 3 .  lisinopril (PRINIVIL,ZESTRIL) 2.5 MG tablet, TAKE 1 TABLET (2.5 MG TOTAL) BY MOUTH DAILY., Disp: 90 tablet, Rfl: 1 .  lovastatin (MEVACOR) 40 MG tablet, Take 40 mg by mouth daily., Disp: , Rfl:  .  omeprazole (PRILOSEC OTC) 20 MG tablet, Take 20 mg by mouth daily.  , Disp: , Rfl:  .  potassium chloride SA (K-DUR,KLOR-CON) 20 MEQ tablet, Take 1 tablet (20 mEq total) by mouth daily., Disp: 90 tablet,  Rfl: 3  Social History   Tobacco Use  Smoking Status Never Smoker  Smokeless Tobacco Never Used    Allergies  Allergen Reactions  . Metoprolol     fatigue   Objective:   There were no vitals filed for this visit. There is no height or weight on file to calculate BMI. Constitutional Well developed. Well nourished.  Vascular Dorsalis pedis pulses palpable bilaterally. Posterior tibial pulses palpable bilaterally. Capillary refill normal to all digits.  No cyanosis or clubbing noted. Pedal hair growth normal.  Neurologic Normal speech. Oriented to person, place, and time. Epicritic sensation to light touch grossly present bilaterally.  Dermatologic Nails well groomed and normal in appearance. Small 0.2 x 0.1 ulceration at the distal lateral aspect of the fifth toe with hyperkeratosis no drainage  Orthopedic: Normal joint ROM without pain or crepitus bilaterally. Adductovarus deformity left fifth toe Pain palpation of the left fifth toe DIPJ   Radiographs: None today Assessment:   1. Skin ulcer of left foot, limited to breakdown of skin ([place])   2. Hammer toe of left foot   3. 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  Adductovarus left fifth toe with hx ulcer toe -Recurrence of ulcerative lesion concerning.  Ulceration debrided today with only small open ulceration.   -Discussed correcting the hammertoe to prevent future recurrence patient verbalized understanding wishes to proceed. -We will perform hammertoe correction and pending process ulceration would consider possible surgical excision and closure of the ulcer -We will perform procedure in the office in [place].  15 minutes of face to face time were spent with the patient. >50% of this was spent on counseling and coordination of care. Specifically discussed with patient the above diagnoses and overall treatment plan.   Ulcer L 5th  Toe Distal -Debrided as below  Procedure: Selective Debridement of Wound Rationale: Removal of devitalized tissue from the wound to promote healing.  Pre-Debridement Wound Measurements: 0.2 cm x 0.1 cm x 0.1 cm  Post-Debridement Wound Measurements: same as pre-debridement. Type of Debridement: sharp selective Tissue Removed: Devitalized soft-tissue Dressing: Dry, sterile, compression dressing. Disposition: Patient tolerated procedure well. Patient to return in 1 week for follow-up.    Return for Post-op.

## 2018-06-08 ENCOUNTER — Telehealth (INDEPENDENT_AMBULATORY_CARE_PROVIDER_SITE_OTHER): Payer: Self-pay | Admitting: *Deleted

## 2018-06-08 NOTE — Telephone Encounter (Signed)
Notification or Prior Authorization is not required for the requested services  This [employer] members plan does not currently require a prior authorization for [address]  Decision ID #:[id number]

## 2018-06-08 NOTE — Telephone Encounter (Signed)
Pt does not take Bts.

## 2018-06-08 NOTE — Telephone Encounter (Signed)
Called pt and she is scheduled 06/23/18 with driver.

## 2018-06-16 DIAGNOSIS — Z0000 Encounter for general adult medical examination without abnormal findings: Secondary | ICD-10-CM | POA: Diagnosis not present

## 2018-06-16 DIAGNOSIS — E782 Mixed hyperlipidemia: Secondary | ICD-10-CM | POA: Diagnosis not present

## 2018-06-16 DIAGNOSIS — Z8571 Personal history of Hodgkin lymphoma: Secondary | ICD-10-CM | POA: Diagnosis not present

## 2018-06-16 DIAGNOSIS — I5032 Chronic diastolic (congestive) heart failure: Secondary | ICD-10-CM | POA: Diagnosis not present

## 2018-06-16 DIAGNOSIS — I10 Essential (primary) hypertension: Secondary | ICD-10-CM | POA: Diagnosis not present

## 2018-06-18 ENCOUNTER — Ambulatory Visit (INDEPENDENT_AMBULATORY_CARE_PROVIDER_SITE_OTHER): Payer: Medicare Other | Admitting: Podiatry

## 2018-06-18 ENCOUNTER — Encounter: Payer: Self-pay | Admitting: Podiatry

## 2018-06-18 ENCOUNTER — Ambulatory Visit (INDEPENDENT_AMBULATORY_CARE_PROVIDER_SITE_OTHER): Payer: Medicare Other

## 2018-06-18 VITALS — Temp 96.5°F

## 2018-06-18 DIAGNOSIS — M2042 Other hammer toe(s) (acquired), left foot: Secondary | ICD-10-CM

## 2018-06-18 MED ORDER — HYDROCODONE-ACETAMINOPHEN 10-325 MG PO TABS: 1.0000 | ORAL_TABLET | ORAL | 0 refills | Status: AC | PRN

## 2018-06-18 MED ORDER — CEPHALEXIN 500 MG PO CAPS: 500.0000 mg | ORAL_CAPSULE | Freq: Two times a day (BID) | ORAL | 0 refills | Status: AC

## 2018-06-18 NOTE — Progress Notes (Signed)
Patient Name: Kristin Moon DOB: [birth year]  MRN: [id number]   Date of Service: 06/18/18   Surgeon: Dr. Hardie Pulley, DPM Assistants: None Pre-operative Diagnosis: Hammertoe L 5th Toe Post-operative Diagnosis: Same Procedures:             1) Hammertoe Correction L 5th Toe Pathology/Specimens: * Cannot find log * Anesthesia: Local 3 ccs 50/50 1% Lidocaine plain, 0.5% marcaine plain Hemostasis: Anatomic Estimated Blood Loss: 6ml Materials: None Medications: none Complications: None  Indications for Procedure:  This is a 73 y.o. female with a painful hammertoe to the left 5th toe.   Procedure in Detail: Patient was given a pre-operative block in the exam room. Formal consent was reviewed. Patient was brought back to the procedure room and placed in the operating chair in the supine position.  The extremity was prepped and draped in the usual sterile fashion. Timeout was taken to confirm patient name, laterality, and procedure prior to incision. Attention was then directed to the left 5th toe where two semi-elliptical incisions were made about the toe to rerotate the skin contracture. Dissection was continued to the PIPJ joint capsule. The capsule was incised. The collateral ligaments were freed about the joint to expose the proximal phalanx head. The head was excised with a sagittal saw. The area was copiously irrigated. The capsule was closed with 4-0 monoryl. The skin was closed with 4-0 nylon.  The foot was then dressed with xeroform, 4x4, kerlix, and ACE bandage. Patient tolerated the procedure well.

## 2018-06-21 ENCOUNTER — Ambulatory Visit: Payer: Self-pay

## 2018-06-21 ENCOUNTER — Encounter: Payer: Self-pay | Admitting: Podiatry

## 2018-06-21 ENCOUNTER — Other Ambulatory Visit: Payer: Self-pay

## 2018-06-21 ENCOUNTER — Ambulatory Visit (INDEPENDENT_AMBULATORY_CARE_PROVIDER_SITE_OTHER): Payer: Medicare Other | Admitting: Podiatry

## 2018-06-21 VITALS — BP 126/59 | HR 91 | Temp 97.5°F | Resp 16

## 2018-06-21 DIAGNOSIS — M2042 Other hammer toe(s) (acquired), left foot: Secondary | ICD-10-CM

## 2018-06-21 NOTE — Progress Notes (Signed)
Subjective:  Patient ID: Kristin Moon, female    DOB: 10/20/1945,  MRN: [id number]  Chief Complaint  Patient presents with  . Routine Post Op    POV #1 Pt.s tates," it's  fine, no pian at all. I had cramps the first day like for 30 minutes and that was it." Tx: post op shoe and ibupfrofen -pt denies N/V/F/Ch     DOS: 06/18/2018 Procedure: Left 47th Hammertoe correction  73 y.o. female returns for post-op check. Doing well pleased with surgical experience denies pain or discomfort.  Review of Systems: Negative except as noted in the HPI. Denies N/V/F/Ch.  Past Medical History:  Diagnosis Date  . Chronic diastolic CHF (congestive heart failure) ([place])   . Diastolic heart failure    Grade 1 per echo May 2012  . Hyperlipidemia   . Hypertension   . Hypothyroidism   . Left bundle branch block   . Non Hodgkin's lymphoma ([place])    history  . Non Hodgkin's lymphoma ([place])   . S/P right and left heart catheterization May 2012   minimal CAD. EF of 50%  . Transaminitis     Current Outpatient Medications:  .  carvedilol (COREG) 6.25 MG tablet, TAKE 1 TABLET BY MOUTH 2 TIMES DAILY WITH A MEAL., Disp: 180 tablet, Rfl: 0 .  cephALEXin (KEFLEX) 500 MG capsule, Take 1 capsule (500 mg total) by mouth 2 (two) times daily., Disp: 14 capsule, Rfl: 0 .  Cholecalciferol (VITAMIN D3) 2000 UNITS TABS, Take 1 tablet by mouth daily. , Disp: , Rfl:  .  estradiol (ESTRACE) 0.5 MG tablet, TAKE 1 TABLET BY MOUTH DAILY FOR THREE WEEKS, 1 WEEK OFF, Disp: , Rfl: 3 .  estrogens conjugated, synthetic A, (CENESTIN) 0.625 MG tablet, Take 0.625 mg by mouth daily.  , Disp: , Rfl:  .  fluticasone (FLONASE) 50 MCG/ACT nasal spray, Place 1 spray into both nostrils as needed for allergies or rhinitis., Disp: , Rfl:  .  furosemide (LASIX) 20 MG tablet, Take 1 tablet (20 mg total) by mouth daily., Disp: 90 tablet, Rfl: 3 .  HYDROcodone-acetaminophen (NORCO) 10-325 MG tablet, Take 1 tablet by mouth every 4 (four) hours as  needed., Disp: 20 tablet, Rfl: 0 .  levothyroxine (SYNTHROID, LEVOTHROID) 75 MCG tablet, Take 75 mcg by mouth daily., Disp: , Rfl: 3 .  lisinopril (PRINIVIL,ZESTRIL) 2.5 MG tablet, TAKE 1 TABLET (2.5 MG TOTAL) BY MOUTH DAILY., Disp: 90 tablet, Rfl: 1 .  lovastatin (MEVACOR) 40 MG tablet, Take 40 mg by mouth daily., Disp: , Rfl:  .  omeprazole (PRILOSEC OTC) 20 MG tablet, Take 20 mg by mouth daily.  , Disp: , Rfl:  .  potassium chloride SA (K-DUR,KLOR-CON) 20 MEQ tablet, Take 1 tablet (20 mEq total) by mouth daily., Disp: 90 tablet, Rfl: 3  Social History   Tobacco Use  Smoking Status Never Smoker  Smokeless Tobacco Never Used    Allergies  Allergen Reactions  . Metoprolol     fatigue   Objective:   Vitals:   06/21/18 1618  BP: (!) 126/59  Pulse: 91  Resp: 16  Temp: (!) 97.5 F (36.4 C)   There is no height or weight on file to calculate BMI. Constitutional Well developed. Well nourished.  Vascular Foot warm and well perfused. Capillary refill normal to all digits.   Neurologic Normal speech. Oriented to person, place, and time. Epicritic sensation to light touch grossly present bilaterally.  Dermatologic Skin healing well without signs of infection. Skin  edges well coapted without signs of infection.  Orthopedic: Tenderness to palpation noted about the surgical site. Toe rectus   Radiographs: Taken and reviewed c/w post-op state Assessment:   1. Hammer toe of left foot    Plan:  Patient was evaluated and treated and all questions answered.  S/p foot surgery left -Progressing as expected post-operatively. -XR: as above -WB Status: WBAT in surgical shoe -Sutures: Intact. -Medications: none refilled today. -Foot redressed.  No follow-ups on file.

## 2018-06-22 ENCOUNTER — Encounter: Payer: Medicare Other | Admitting: Podiatry

## 2018-06-23 ENCOUNTER — Ambulatory Visit (INDEPENDENT_AMBULATORY_CARE_PROVIDER_SITE_OTHER): Payer: Self-pay

## 2018-06-23 ENCOUNTER — Ambulatory Visit (INDEPENDENT_AMBULATORY_CARE_PROVIDER_SITE_OTHER): Payer: Medicare Other | Admitting: Physical Medicine and Rehabilitation

## 2018-06-23 ENCOUNTER — Encounter (INDEPENDENT_AMBULATORY_CARE_PROVIDER_SITE_OTHER): Payer: Self-pay | Admitting: Physical Medicine and Rehabilitation

## 2018-06-23 VITALS — BP 129/58 | HR 79 | Temp 97.9°F

## 2018-06-23 DIAGNOSIS — M5416 Radiculopathy, lumbar region: Secondary | ICD-10-CM | POA: Diagnosis not present

## 2018-06-23 MED ORDER — METHYLPREDNISOLONE ACETATE 80 MG/ML IJ SUSP: 80.0000 mg | Freq: Once | INTRAMUSCULAR | Status: AC

## 2018-06-23 NOTE — Progress Notes (Signed)
 .  Numeric Pain Rating Scale and Functional Assessment Average Pain 8   In the last MONTH (on 0-10 scale) has pain interfered with the following?  1. General activity like being  able to carry out your everyday physical activities such as walking, climbing stairs, carrying groceries, or moving a chair?  Rating(4)   +Driver, -BT, -Dye Allergies.  

## 2018-06-23 NOTE — Patient Instructions (Signed)

## 2018-06-24 NOTE — Progress Notes (Signed)
Kristin Moon - 73 y.o. female MRN [id number]  Date of birth: [birth year]  Office Visit Note: Visit Date: 06/23/2018 PCP: Merrilee Seashore, MD Referred by: Merrilee Seashore, MD  Subjective: Chief Complaint  Patient presents with  . Lower Back - Pain  . Right Leg - Pain  . Right Hip - Pain   HPI:  Kristin Moon is a 73 y.o. female who comes in today Evaluation management of low back and right radicular leg pain in L5 distribution.  Her symptoms have not changed since the last time I saw her.  We completed transforaminal injection at L5 with good fluoroscopic imaging without much relief.  At that time we decided to complete updated MRI of the lumbar spine.  Prior MRI was 10 years ago.  New MRI does show significant facet arthropathy changes particular at L4-5 with small listhesis and gaping facet joints.  There is moderate stenosis now at this level which was not there present before L5-S1 so small listhesis as well as facet arthropathy without much narrowing.  No focal disc herniation or focal nerve root compression.  I think the best approach is a right L4-5 interlaminar injection just to see if we can get her right leg pain to calm down.  She has had facet joint injections in the past which were beneficial but just were not helping that much.  ROS Otherwise per HPI.  Assessment & Plan: Visit Diagnoses:  1. Lumbar radiculopathy     Plan: No additional findings.   Meds & Orders:  Meds ordered this encounter  Medications  . methylPREDNISolone acetate (DEPO-MEDROL) injection 80 mg    Orders Placed This Encounter  Procedures  . XR C-ARM NO REPORT  . Epidural Steroid injection    Follow-up: Return if symptoms worsen or fail to improve.   Procedures: No procedures performed  Lumbar Epidural Steroid Injection - Interlaminar Approach with Fluoroscopic Guidance  Patient: Kristin Moon      Date of Birth: 30-Apr-1946 MRN: [id number] PCP: Merrilee Seashore, MD      Visit  Date: 06/23/2018   Universal Protocol:     Consent Given By: the patient  Position: PRONE  Additional Comments: Vital signs were monitored before and after the procedure. Patient was prepped and draped in the usual sterile fashion. The correct patient, procedure, and site was verified.   Injection Procedure Details:  Procedure Site One Meds Administered:  Meds ordered this encounter  Medications  . methylPREDNISolone acetate (DEPO-MEDROL) injection 80 mg     Laterality: Right  Location/Site:  L4-L5  Needle size: 20 G  Needle type: Tuohy  Needle Placement: Paramedian epidural  Findings:   -Comments: Excellent flow of contrast into the epidural space.  Procedure Details: Using a paramedian approach from the side mentioned above, the region overlying the inferior lamina was localized under fluoroscopic visualization and the soft tissues overlying this structure were infiltrated with 4 ml. of 1% Lidocaine without Epinephrine. The Tuohy needle was inserted into the epidural space using a paramedian approach.   The epidural space was localized using loss of resistance along with lateral and bi-planar fluoroscopic views.  After negative aspirate for air, blood, and CSF, a 2 ml. volume of Isovue-250 was injected into the epidural space and the flow of contrast was observed. Radiographs were obtained for documentation purposes.    The injectate was administered into the level noted above.   Additional Comments:  The patient tolerated the procedure well Dressing: Band-Aid    Post-procedure  details: Patient was observed during the procedure. Post-procedure instructions were reviewed.  Patient left the clinic in stable condition.   Clinical History: [facility] LUMBAR SPINE WITHOUT CONTRAST  TECHNIQUE: Multiplanar, multisequence MR imaging of the lumbar spine was performed. No intravenous contrast was administered.  COMPARISON:  08/03/2008  FINDINGS: Segmentation: 5  non rib-bearing lumbar type vertebrae. Rudimentary ribs at T12.  Alignment: New facet mediated anterolisthesis measuring 5 mm at L4-5 and L5-S1.  Vertebrae: No fracture. Mildly heterogeneous bone marrow signal diffusely without suspicious focal lesion. Scattered small vertebral hemangiomas.  Conus medullaris and cauda equina: Conus extends to the L1 level. Conus and cauda equina appear normal.  Paraspinal and other soft tissues: Unremarkable.  Disc levels:  Disc desiccation greatest at L4-5. Progressive, mild disc space narrowing at L4-5 and new mild disc space narrowing at L5-S1.  T12-L1: Negative.  L1-2: Slight facet hypertrophy without disc herniation or stenosis.  L2-3: Mild facet hypertrophy and new minimal disc bulging without stenosis.  L3-4: Mild disc bulging and mild-to-moderate facet hypertrophy, minimally progressed. No stenosis.  L4-5: New anterolisthesis with disc uncovering, broad central pseudo disc protrusion, and severe facet and mild ligamentum flavum hypertrophy result in new moderate spinal stenosis and mild bilateral lateral recess stenosis. There are right larger than left facet joint effusions which can be seen in the setting of instability.  L5-S1: Anterolisthesis with disc bulging and severe facet hypertrophy result in new mild bilateral lateral recess stenosis without significant spinal or neural foraminal stenosis. Small facet joint effusions.  IMPRESSION: 1. 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 2. 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 3. Mild lateral recess stenosis at L5-S1. 4. Slight progression of disc and facet degeneration at L3-4 without stenosis.   Electronically Signed   By: Logan Bores M.D.   On: 06/07/2018 07:43     Objective:  VS:  HT:    WT:   BMI:     BP:(!) 129/58  HR:79bpm  TEMP:97.9 F (36.6 C)(Oral)  RESP:  Physical  Exam Musculoskeletal:     Right lower leg: No edema.     Left lower leg: No edema.     Comments: Patient ambulates without aid.  She has no pain over the greater trochanters and good distal strength.  Neurological:     General: No focal deficit present.     Mental Status: She is alert and oriented to person, place, and time.     Motor: No abnormal muscle tone.     Coordination: Coordination normal.  Psychiatric:        Mood and Affect: Mood normal.        Behavior: Behavior normal.     Ortho Exam Imaging: Xr C-arm No Report  Result Date: 06/23/2018 Please see Notes tab for imaging impression.

## 2018-06-24 NOTE — Procedures (Signed)
Lumbar Epidural Steroid Injection - Interlaminar Approach with Fluoroscopic Guidance  Patient: Kristin Moon      Date of Birth: December 31, 1945 MRN: [id number] PCP: Merrilee Seashore, MD      Visit Date: 06/23/2018   Universal Protocol:     Consent Given By: the patient  Position: PRONE  Additional Comments: Vital signs were monitored before and after the procedure. Patient was prepped and draped in the usual sterile fashion. The correct patient, procedure, and site was verified.   Injection Procedure Details:  Procedure Site One Meds Administered:  Meds ordered this encounter  Medications  . methylPREDNISolone acetate (DEPO-MEDROL) injection 80 mg     Laterality: Right  Location/Site:  L4-L5  Needle size: 20 G  Needle type: Tuohy  Needle Placement: Paramedian epidural  Findings:   -Comments: Excellent flow of contrast into the epidural space.  Procedure Details: Using a paramedian approach from the side mentioned above, the region overlying the inferior lamina was localized under fluoroscopic visualization and the soft tissues overlying this structure were infiltrated with 4 ml. of 1% Lidocaine without Epinephrine. The Tuohy needle was inserted into the epidural space using a paramedian approach.   The epidural space was localized using loss of resistance along with lateral and bi-planar fluoroscopic views.  After negative aspirate for air, blood, and CSF, a 2 ml. volume of Isovue-250 was injected into the epidural space and the flow of contrast was observed. Radiographs were obtained for documentation purposes.    The injectate was administered into the level noted above.   Additional Comments:  The patient tolerated the procedure well Dressing: Band-Aid    Post-procedure details: Patient was observed during the procedure. Post-procedure instructions were reviewed.  Patient left the clinic in stable condition.

## 2018-06-29 ENCOUNTER — Ambulatory Visit (INDEPENDENT_AMBULATORY_CARE_PROVIDER_SITE_OTHER): Payer: Medicare Other | Admitting: Podiatry

## 2018-06-29 ENCOUNTER — Other Ambulatory Visit: Payer: Self-pay

## 2018-06-29 ENCOUNTER — Encounter: Payer: Self-pay | Admitting: Podiatry

## 2018-06-29 VITALS — BP 115/54 | HR 76 | Temp 98.0°F | Resp 16

## 2018-06-29 DIAGNOSIS — Z9889 Other specified postprocedural states: Secondary | ICD-10-CM

## 2018-06-29 DIAGNOSIS — M2042 Other hammer toe(s) (acquired), left foot: Secondary | ICD-10-CM

## 2018-06-29 NOTE — Progress Notes (Signed)
Subjective:  Patient ID: Kristin Moon, female    DOB: [birth year],  MRN: [id number]  Chief Complaint  Patient presents with  . Routine Post Op    POV# 2 Pt. states," foo tdoin great. I tgets a little thight and swollen if Im on it too much." Tx: none -pt denies N/V/F/ch    DOS: 06/18/2018 Procedure: Left 5th Hammertoe correction  73 y.o. female returns for post-op check. History as above.  Review of Systems: Negative except as noted in the HPI. Denies N/V/F/Ch.  Past Medical History:  Diagnosis Date  . Chronic diastolic CHF (congestive heart failure) ([place])   . Diastolic heart failure    Grade 1 per echo May 2012  . Hyperlipidemia   . Hypertension   . Hypothyroidism   . Left bundle branch block   . Non Hodgkin's lymphoma ([place])    history  . Non Hodgkin's lymphoma ([place])   . S/P right and left heart catheterization May 2012   minimal CAD. EF of 50%  . Transaminitis     Current Outpatient Medications:  .  carvedilol (COREG) 6.25 MG tablet, TAKE 1 TABLET BY MOUTH 2 TIMES DAILY WITH A MEAL., Disp: 180 tablet, Rfl: 0 .  cephALEXin (KEFLEX) 500 MG capsule, Take 1 capsule (500 mg total) by mouth 2 (two) times daily., Disp: 14 capsule, Rfl: 0 .  Cholecalciferol (VITAMIN D3) 2000 UNITS TABS, Take 1 tablet by mouth daily. , Disp: , Rfl:  .  estradiol (ESTRACE) 0.5 MG tablet, TAKE 1 TABLET BY MOUTH DAILY FOR THREE WEEKS, 1 WEEK OFF, Disp: , Rfl: 3 .  estrogens conjugated, synthetic A, (CENESTIN) 0.625 MG tablet, Take 0.625 mg by mouth daily.  , Disp: , Rfl:  .  Fe Fum-FePoly-Vit C-Vit B3 (INTEGRA) 62.5-62.5-40-3 MG CAPS, Take 1 tablet by mouth daily., Disp: , Rfl:  .  fluticasone (FLONASE) 50 MCG/ACT nasal spray, Place 1 spray into both nostrils as needed for allergies or rhinitis., Disp: , Rfl:  .  furosemide (LASIX) 20 MG tablet, Take 1 tablet (20 mg total) by mouth daily., Disp: 90 tablet, Rfl: 3 .  HYDROcodone-acetaminophen (NORCO) 10-325 MG tablet, Take 1 tablet by mouth every 4  (four) hours as needed., Disp: 20 tablet, Rfl: 0 .  levothyroxine (SYNTHROID, LEVOTHROID) 75 MCG tablet, Take 75 mcg by mouth daily., Disp: , Rfl: 3 .  lisinopril (PRINIVIL,ZESTRIL) 2.5 MG tablet, TAKE 1 TABLET (2.5 MG TOTAL) BY MOUTH DAILY., Disp: 90 tablet, Rfl: 1 .  lovastatin (MEVACOR) 40 MG tablet, Take 40 mg by mouth daily., Disp: , Rfl:  .  omeprazole (PRILOSEC OTC) 20 MG tablet, Take 20 mg by mouth daily.  , Disp: , Rfl:  .  omeprazole (PRILOSEC) 20 MG capsule, , Disp: , Rfl:  .  potassium chloride SA (K-DUR,KLOR-CON) 20 MEQ tablet, Take 1 tablet (20 mEq total) by mouth daily., Disp: 90 tablet, Rfl: 3  Current Facility-Administered Medications:  .  methylPREDNISolone acetate (DEPO-MEDROL) injection 80 mg, 80 mg, Other, Once, Magnus Sinning, MD  Social History   Tobacco Use  Smoking Status Never Smoker  Smokeless Tobacco Never Used    Allergies  Allergen Reactions  . Metoprolol     fatigue   Objective:   Vitals:   06/29/18 1347  BP: (!) 115/54  Pulse: 76  Resp: 16  Temp: 98 F (36.7 C)   There is no height or weight on file to calculate BMI. Constitutional Well developed. Well nourished.  Vascular Foot warm and well perfused. Capillary  refill normal to all digits.   Neurologic Normal speech. Oriented to person, place, and time. Epicritic sensation to light touch grossly present bilaterally.  Dermatologic Skin healing well without signs of infection. Skin edges well coapted without signs of infection.  Orthopedic: Tenderness to palpation noted about the surgical site. Toe rectus   Radiographs: none Assessment:   1. Hammer toe of left foot   2. Post-operative state    Plan:  Patient was evaluated and treated and all questions answered.  S/p foot surgery left -Progressing as expected post-operatively. -XR: as above -WB Status: WBAT in surgical shoe -Sutures: Intact. Left intact one more week but will let patient shower but not soak. Apply ointment and  band-aid daily. -Medications: none refilled today. -Foot redressed.  No follow-ups on file.

## 2018-07-06 ENCOUNTER — Ambulatory Visit (INDEPENDENT_AMBULATORY_CARE_PROVIDER_SITE_OTHER): Payer: Self-pay | Admitting: Podiatry

## 2018-07-06 DIAGNOSIS — Z9889 Other specified postprocedural states: Secondary | ICD-10-CM

## 2018-07-06 DIAGNOSIS — M2042 Other hammer toe(s) (acquired), left foot: Secondary | ICD-10-CM

## 2018-07-06 NOTE — Progress Notes (Signed)
Subjective:  Patient ID: Kristin Moon, female    DOB: 07-Jan-1946,  MRN: [id number]  No chief complaint on file.  DOS: 06/18/2018 Procedure: Left 65th Hammertoe correction  73 y.o. female returns for post-op check. History as above.  Review of Systems: Negative except as noted in the HPI. Denies N/V/F/Ch.  Past Medical History:  Diagnosis Date  . Chronic diastolic CHF (congestive heart failure) ([place])   . Diastolic heart failure    Grade 1 per echo May 2012  . Hyperlipidemia   . Hypertension   . Hypothyroidism   . Left bundle branch block   . Non Hodgkin's lymphoma ([place])    history  . Non Hodgkin's lymphoma ([place])   . S/P right and left heart catheterization May 2012   minimal CAD. EF of 50%  . Transaminitis     Current Outpatient Medications:  .  carvedilol (COREG) 6.25 MG tablet, TAKE 1 TABLET BY MOUTH 2 TIMES DAILY WITH A MEAL., Disp: 180 tablet, Rfl: 0 .  cephALEXin (KEFLEX) 500 MG capsule, Take 1 capsule (500 mg total) by mouth 2 (two) times daily., Disp: 14 capsule, Rfl: 0 .  Cholecalciferol (VITAMIN D3) 2000 UNITS TABS, Take 1 tablet by mouth daily. , Disp: , Rfl:  .  estradiol (ESTRACE) 0.5 MG tablet, TAKE 1 TABLET BY MOUTH DAILY FOR THREE WEEKS, 1 WEEK OFF, Disp: , Rfl: 3 .  estrogens conjugated, synthetic A, (CENESTIN) 0.625 MG tablet, Take 0.625 mg by mouth daily.  , Disp: , Rfl:  .  Fe Fum-FePoly-Vit C-Vit B3 (INTEGRA) 62.5-62.5-40-3 MG CAPS, Take 1 tablet by mouth daily., Disp: , Rfl:  .  fluticasone (FLONASE) 50 MCG/ACT nasal spray, Place 1 spray into both nostrils as needed for allergies or rhinitis., Disp: , Rfl:  .  furosemide (LASIX) 20 MG tablet, Take 1 tablet (20 mg total) by mouth daily., Disp: 90 tablet, Rfl: 3 .  HYDROcodone-acetaminophen (NORCO) 10-325 MG tablet, Take 1 tablet by mouth every 4 (four) hours as needed., Disp: 20 tablet, Rfl: 0 .  levothyroxine (SYNTHROID, LEVOTHROID) 75 MCG tablet, Take 75 mcg by mouth daily., Disp: , Rfl: 3 .  lisinopril  (PRINIVIL,ZESTRIL) 2.5 MG tablet, TAKE 1 TABLET (2.5 MG TOTAL) BY MOUTH DAILY., Disp: 90 tablet, Rfl: 1 .  lovastatin (MEVACOR) 40 MG tablet, Take 40 mg by mouth daily., Disp: , Rfl:  .  omeprazole (PRILOSEC OTC) 20 MG tablet, Take 20 mg by mouth daily.  , Disp: , Rfl:  .  omeprazole (PRILOSEC) 20 MG capsule, , Disp: , Rfl:  .  potassium chloride SA (K-DUR,KLOR-CON) 20 MEQ tablet, Take 1 tablet (20 mEq total) by mouth daily., Disp: 90 tablet, Rfl: 3  Current Facility-Administered Medications:  .  methylPREDNISolone acetate (DEPO-MEDROL) injection 80 mg, 80 mg, Other, Once, Magnus Sinning, MD  Social History   Tobacco Use  Smoking Status Never Smoker  Smokeless Tobacco Never Used    Allergies  Allergen Reactions  . Metoprolol     fatigue   Objective:   There were no vitals filed for this visit. There is no height or weight on file to calculate BMI. Constitutional Well developed. Well nourished.  Vascular Foot warm and well perfused. Capillary refill normal to all digits.   Neurologic Normal speech. Oriented to person, place, and time. Epicritic sensation to light touch grossly present bilaterally.  Dermatologic Skin healing well without signs of infection. Skin edges well coapted without signs of infection.  Orthopedic: Tenderness to palpation noted about the surgical site.  Toe rectus   Radiographs: none Assessment:   1. Hammer toe of left foot   2. Post-operative state    Plan:  Patient was evaluated and treated and all questions answered.  S/p foot surgery left -Progressing as expected post-operatively. -XR: as above -WB Status: WBAT in surgical shoe. Transition to normal shoe in one week. -Sutures: Removed. Steris applied. -Medications: none refilled today. -Foot redressed.  No follow-ups on file.

## 2018-07-14 DIAGNOSIS — J029 Acute pharyngitis, unspecified: Secondary | ICD-10-CM | POA: Diagnosis not present

## 2018-07-14 DIAGNOSIS — J069 Acute upper respiratory infection, unspecified: Secondary | ICD-10-CM | POA: Diagnosis not present

## 2018-07-15 ENCOUNTER — Other Ambulatory Visit: Payer: Self-pay

## 2018-07-15 ENCOUNTER — Emergency Department (HOSPITAL_BASED_OUTPATIENT_CLINIC_OR_DEPARTMENT_OTHER): Payer: Medicare Other

## 2018-07-15 ENCOUNTER — Encounter (HOSPITAL_BASED_OUTPATIENT_CLINIC_OR_DEPARTMENT_OTHER): Payer: Self-pay | Admitting: *Deleted

## 2018-07-15 ENCOUNTER — Inpatient Hospital Stay (HOSPITAL_BASED_OUTPATIENT_CLINIC_OR_DEPARTMENT_OTHER)
Admission: EM | Admit: 2018-07-15 | Discharge: 2018-08-08 | DRG: 004 | Disposition: E | Payer: Medicare Other | Attending: Pulmonary Disease | Admitting: Pulmonary Disease

## 2018-07-15 DIAGNOSIS — Z9689 Presence of other specified functional implants: Secondary | ICD-10-CM

## 2018-07-15 DIAGNOSIS — J80 Acute respiratory distress syndrome: Secondary | ICD-10-CM | POA: Diagnosis present

## 2018-07-15 DIAGNOSIS — J9690 Respiratory failure, unspecified, unspecified whether with hypoxia or hypercapnia: Secondary | ICD-10-CM | POA: Diagnosis not present

## 2018-07-15 DIAGNOSIS — I110 Hypertensive heart disease with heart failure: Secondary | ICD-10-CM | POA: Diagnosis not present

## 2018-07-15 DIAGNOSIS — R091 Pleurisy: Secondary | ICD-10-CM | POA: Diagnosis not present

## 2018-07-15 DIAGNOSIS — K828 Other specified diseases of gallbladder: Secondary | ICD-10-CM | POA: Diagnosis not present

## 2018-07-15 DIAGNOSIS — J90 Pleural effusion, not elsewhere classified: Secondary | ICD-10-CM | POA: Diagnosis not present

## 2018-07-15 DIAGNOSIS — G9341 Metabolic encephalopathy: Secondary | ICD-10-CM | POA: Diagnosis present

## 2018-07-15 DIAGNOSIS — I469 Cardiac arrest, cause unspecified: Secondary | ICD-10-CM | POA: Diagnosis not present

## 2018-07-15 DIAGNOSIS — E039 Hypothyroidism, unspecified: Secondary | ICD-10-CM | POA: Diagnosis present

## 2018-07-15 DIAGNOSIS — J154 Pneumonia due to other streptococci: Secondary | ICD-10-CM | POA: Diagnosis not present

## 2018-07-15 DIAGNOSIS — J181 Lobar pneumonia, unspecified organism: Secondary | ICD-10-CM

## 2018-07-15 DIAGNOSIS — N179 Acute kidney failure, unspecified: Secondary | ICD-10-CM | POA: Diagnosis not present

## 2018-07-15 DIAGNOSIS — R0600 Dyspnea, unspecified: Secondary | ICD-10-CM

## 2018-07-15 DIAGNOSIS — A419 Sepsis, unspecified organism: Principal | ICD-10-CM | POA: Diagnosis present

## 2018-07-15 DIAGNOSIS — E875 Hyperkalemia: Secondary | ICD-10-CM | POA: Diagnosis not present

## 2018-07-15 DIAGNOSIS — E785 Hyperlipidemia, unspecified: Secondary | ICD-10-CM | POA: Diagnosis present

## 2018-07-15 DIAGNOSIS — I480 Paroxysmal atrial fibrillation: Secondary | ICD-10-CM | POA: Diagnosis not present

## 2018-07-15 DIAGNOSIS — D649 Anemia, unspecified: Secondary | ICD-10-CM | POA: Diagnosis not present

## 2018-07-15 DIAGNOSIS — R069 Unspecified abnormalities of breathing: Secondary | ICD-10-CM

## 2018-07-15 DIAGNOSIS — J9383 Other pneumothorax: Secondary | ICD-10-CM | POA: Diagnosis not present

## 2018-07-15 DIAGNOSIS — J189 Pneumonia, unspecified organism: Secondary | ICD-10-CM | POA: Diagnosis not present

## 2018-07-15 DIAGNOSIS — Z79818 Long term (current) use of other agents affecting estrogen receptors and estrogen levels: Secondary | ICD-10-CM

## 2018-07-15 DIAGNOSIS — R34 Anuria and oliguria: Secondary | ICD-10-CM | POA: Diagnosis not present

## 2018-07-15 DIAGNOSIS — Z780 Asymptomatic menopausal state: Secondary | ICD-10-CM

## 2018-07-15 DIAGNOSIS — R7989 Other specified abnormal findings of blood chemistry: Secondary | ICD-10-CM

## 2018-07-15 DIAGNOSIS — E870 Hyperosmolality and hypernatremia: Secondary | ICD-10-CM | POA: Diagnosis not present

## 2018-07-15 DIAGNOSIS — Z452 Encounter for adjustment and management of vascular access device: Secondary | ICD-10-CM

## 2018-07-15 DIAGNOSIS — Z8249 Family history of ischemic heart disease and other diseases of the circulatory system: Secondary | ICD-10-CM

## 2018-07-15 DIAGNOSIS — Z4682 Encounter for fitting and adjustment of non-vascular catheter: Secondary | ICD-10-CM | POA: Diagnosis not present

## 2018-07-15 DIAGNOSIS — Z4659 Encounter for fitting and adjustment of other gastrointestinal appliance and device: Secondary | ICD-10-CM

## 2018-07-15 DIAGNOSIS — I2510 Atherosclerotic heart disease of native coronary artery without angina pectoris: Secondary | ICD-10-CM | POA: Diagnosis not present

## 2018-07-15 DIAGNOSIS — J1008 Influenza due to other identified influenza virus with other specified pneumonia: Secondary | ICD-10-CM | POA: Diagnosis present

## 2018-07-15 DIAGNOSIS — G9340 Encephalopathy, unspecified: Secondary | ICD-10-CM | POA: Diagnosis not present

## 2018-07-15 DIAGNOSIS — Z8572 Personal history of non-Hodgkin lymphomas: Secondary | ICD-10-CM

## 2018-07-15 DIAGNOSIS — E0590 Thyrotoxicosis, unspecified without thyrotoxic crisis or storm: Secondary | ICD-10-CM | POA: Diagnosis present

## 2018-07-15 DIAGNOSIS — J939 Pneumothorax, unspecified: Secondary | ICD-10-CM | POA: Diagnosis not present

## 2018-07-15 DIAGNOSIS — D6489 Other specified anemias: Secondary | ICD-10-CM | POA: Diagnosis not present

## 2018-07-15 DIAGNOSIS — R509 Fever, unspecified: Secondary | ICD-10-CM

## 2018-07-15 DIAGNOSIS — E876 Hypokalemia: Secondary | ICD-10-CM | POA: Diagnosis not present

## 2018-07-15 DIAGNOSIS — J9601 Acute respiratory failure with hypoxia: Secondary | ICD-10-CM | POA: Diagnosis not present

## 2018-07-15 DIAGNOSIS — J984 Other disorders of lung: Secondary | ICD-10-CM

## 2018-07-15 DIAGNOSIS — J9600 Acute respiratory failure, unspecified whether with hypoxia or hypercapnia: Secondary | ICD-10-CM | POA: Diagnosis not present

## 2018-07-15 DIAGNOSIS — I350 Nonrheumatic aortic (valve) stenosis: Secondary | ICD-10-CM | POA: Diagnosis not present

## 2018-07-15 DIAGNOSIS — E878 Other disorders of electrolyte and fluid balance, not elsewhere classified: Secondary | ICD-10-CM | POA: Diagnosis not present

## 2018-07-15 DIAGNOSIS — J111 Influenza due to unidentified influenza virus with other respiratory manifestations: Secondary | ICD-10-CM

## 2018-07-15 DIAGNOSIS — I447 Left bundle-branch block, unspecified: Secondary | ICD-10-CM | POA: Diagnosis present

## 2018-07-15 DIAGNOSIS — R0902 Hypoxemia: Secondary | ICD-10-CM

## 2018-07-15 DIAGNOSIS — R6521 Severe sepsis with septic shock: Secondary | ICD-10-CM | POA: Diagnosis present

## 2018-07-15 DIAGNOSIS — Z01818 Encounter for other preprocedural examination: Secondary | ICD-10-CM

## 2018-07-15 DIAGNOSIS — I4891 Unspecified atrial fibrillation: Secondary | ICD-10-CM | POA: Diagnosis not present

## 2018-07-15 DIAGNOSIS — Z978 Presence of other specified devices: Secondary | ICD-10-CM | POA: Diagnosis not present

## 2018-07-15 DIAGNOSIS — Z9911 Dependence on respirator [ventilator] status: Secondary | ICD-10-CM | POA: Diagnosis not present

## 2018-07-15 DIAGNOSIS — J9620 Acute and chronic respiratory failure, unspecified whether with hypoxia or hypercapnia: Secondary | ICD-10-CM | POA: Diagnosis not present

## 2018-07-15 DIAGNOSIS — R945 Abnormal results of liver function studies: Secondary | ICD-10-CM

## 2018-07-15 DIAGNOSIS — Z930 Tracheostomy status: Secondary | ICD-10-CM

## 2018-07-15 DIAGNOSIS — J9811 Atelectasis: Secondary | ICD-10-CM | POA: Diagnosis not present

## 2018-07-15 DIAGNOSIS — R000 Tachycardia, unspecified: Secondary | ICD-10-CM | POA: Diagnosis not present

## 2018-07-15 DIAGNOSIS — R739 Hyperglycemia, unspecified: Secondary | ICD-10-CM | POA: Diagnosis present

## 2018-07-15 DIAGNOSIS — I5033 Acute on chronic diastolic (congestive) heart failure: Secondary | ICD-10-CM | POA: Diagnosis not present

## 2018-07-15 LAB — URINALYSIS, ROUTINE W REFLEX MICROSCOPIC
Bilirubin Urine: NEGATIVE
GLUCOSE, UA: NEGATIVE mg/dL
HGB URINE DIPSTICK: NEGATIVE
Ketones, ur: 15 mg/dL — AB
Leukocytes, UA: NEGATIVE
Nitrite: NEGATIVE
Protein, ur: NEGATIVE mg/dL
Specific Gravity, Urine: 1.015 (ref 1.005–1.030)
pH: 6 (ref 5.0–8.0)

## 2018-07-15 LAB — CBC WITH DIFFERENTIAL/PLATELET
Abs Immature Granulocytes: 0.04 10*3/uL (ref 0.00–0.07)
Basophils Absolute: 0 10*3/uL (ref 0.0–0.1)
Basophils Relative: 0 %
Eosinophils Absolute: 0 10*3/uL (ref 0.0–0.5)
Eosinophils Relative: 0 %
HCT: 45.5 % (ref 36.0–46.0)
Hemoglobin: 13.8 g/dL (ref 12.0–15.0)
Immature Granulocytes: 1 %
Lymphocytes Relative: 10 %
Lymphs Abs: 0.8 10*3/uL (ref 0.7–4.0)
MCH: 25.7 pg — ABNORMAL LOW (ref 26.0–34.0)
MCHC: 30.3 g/dL (ref 30.0–36.0)
MCV: 84.7 fL (ref 80.0–100.0)
Monocytes Absolute: 0.3 10*3/uL (ref 0.1–1.0)
Monocytes Relative: 4 %
Neutro Abs: 7.2 10*3/uL (ref 1.7–7.7)
Neutrophils Relative %: 85 %
Platelets: 204 10*3/uL (ref 150–400)
RBC: 5.37 MIL/uL — ABNORMAL HIGH (ref 3.87–5.11)
RDW: 19.3 % — ABNORMAL HIGH (ref 11.5–15.5)
WBC: 8.5 10*3/uL (ref 4.0–10.5)
nRBC: 0 % (ref 0.0–0.2)

## 2018-07-15 LAB — PROTIME-INR
INR: 1.08
PROTHROMBIN TIME: 13.9 s (ref 11.4–15.2)

## 2018-07-15 LAB — COMPREHENSIVE METABOLIC PANEL
ALT: 61 U/L — ABNORMAL HIGH (ref 0–44)
AST: 64 U/L — ABNORMAL HIGH (ref 15–41)
Albumin: 3.5 g/dL (ref 3.5–5.0)
Alkaline Phosphatase: 158 U/L — ABNORMAL HIGH (ref 38–126)
Anion gap: 16 — ABNORMAL HIGH (ref 5–15)
BUN: 29 mg/dL — ABNORMAL HIGH (ref 8–23)
CO2: 19 mmol/L — AB (ref 22–32)
Calcium: 9.6 mg/dL (ref 8.9–10.3)
Chloride: 100 mmol/L (ref 98–111)
Creatinine, Ser: 1.52 mg/dL — ABNORMAL HIGH (ref 0.44–1.00)
GFR calc Af Amer: 40 mL/min — ABNORMAL LOW (ref >60)
GFR calc non Af Amer: 34 mL/min — ABNORMAL LOW (ref >60)
Glucose, Bld: 114 mg/dL — ABNORMAL HIGH (ref 70–99)
Potassium: 4.6 mmol/L (ref 3.5–5.1)
Sodium: 135 mmol/L (ref 135–145)
Total Bilirubin: 1.7 mg/dL — ABNORMAL HIGH (ref 0.3–1.2)
Total Protein: 7.2 g/dL (ref 6.5–8.1)

## 2018-07-15 LAB — LACTIC ACID, PLASMA: Lactic Acid, Venous: 1.6 mmol/L (ref 0.5–1.9)

## 2018-07-15 MED ORDER — SODIUM CHLORIDE 0.9 % IV BOLUS (SEPSIS)
1000.0000 mL | Freq: Once | INTRAVENOUS | Status: AC
  Administered 2018-07-15: 1000 mL via INTRAVENOUS

## 2018-07-15 MED ORDER — SODIUM CHLORIDE 0.9 % IV SOLN
2.0000 g | Freq: Once | INTRAVENOUS | Status: AC
  Administered 2018-07-15: 2 g via INTRAVENOUS
  Filled 2018-07-15: qty 2

## 2018-07-15 MED ORDER — SODIUM CHLORIDE 0.9% FLUSH
3.0000 mL | Freq: Once | INTRAVENOUS | Status: DC
  Filled 2018-07-15: qty 3

## 2018-07-15 MED ORDER — LORAZEPAM 2 MG/ML IJ SOLN
1.0000 mg | Freq: Once | INTRAMUSCULAR | Status: AC
  Administered 2018-07-15: 1 mg via INTRAVENOUS
  Filled 2018-07-15: qty 1

## 2018-07-15 MED ORDER — SODIUM CHLORIDE 0.9 % IV BOLUS (SEPSIS): 250.0000 mL | Freq: Once | INTRAVENOUS | Status: DC

## 2018-07-15 MED ORDER — SODIUM CHLORIDE 0.9 % IV SOLN
2.0000 g | INTRAVENOUS | Status: DC
  Filled 2018-07-15: qty 2

## 2018-07-15 MED ORDER — FENTANYL CITRATE (PF) 100 MCG/2ML IJ SOLN
12.5000 ug | Freq: Once | INTRAMUSCULAR | Status: AC
  Administered 2018-07-15: 12.5 ug via INTRAVENOUS
  Filled 2018-07-15: qty 2

## 2018-07-15 MED ORDER — VANCOMYCIN HCL 500 MG IV SOLR
INTRAVENOUS | Status: AC
  Administered 2018-07-15: 500 mg
  Filled 2018-07-15: qty 500

## 2018-07-15 MED ORDER — CEFEPIME HCL 2 G IJ SOLR
INTRAMUSCULAR | Status: AC
  Filled 2018-07-15: qty 2

## 2018-07-15 MED ORDER — METRONIDAZOLE IN NACL 5-0.79 MG/ML-% IV SOLN
500.0000 mg | Freq: Three times a day (TID) | INTRAVENOUS | Status: DC
  Administered 2018-07-15 – 2018-07-16 (×2): 500 mg via INTRAVENOUS
  Filled 2018-07-15: qty 100

## 2018-07-15 MED ORDER — OSELTAMIVIR PHOSPHATE 75 MG PO CAPS
75.0000 mg | ORAL_CAPSULE | Freq: Once | ORAL | Status: AC
  Administered 2018-07-15: 75 mg via ORAL
  Filled 2018-07-15: qty 1

## 2018-07-15 MED ORDER — VANCOMYCIN HCL 10 G IV SOLR
1500.0000 mg | Freq: Once | INTRAVENOUS | Status: AC
  Administered 2018-07-15: 1500 mg via INTRAVENOUS
  Filled 2018-07-15: qty 1500

## 2018-07-15 MED ORDER — VANCOMYCIN HCL IN DEXTROSE 1-5 GM/200ML-% IV SOLN
1000.0000 mg | Freq: Once | INTRAVENOUS | Status: DC
  Administered 2018-07-15: 1000 mg via INTRAVENOUS
  Filled 2018-07-15: qty 200

## 2018-07-15 MED ORDER — VANCOMYCIN HCL IN DEXTROSE 1-5 GM/200ML-% IV SOLN: 1000.0000 mg | INTRAVENOUS | Status: DC

## 2018-07-15 NOTE — ED Triage Notes (Signed)
She tested positive for flu yesterday. Today she is weak, coughing blood.

## 2018-07-15 NOTE — Progress Notes (Signed)
Pharmacy Antibiotic Note  Kristin Moon is a 73 y.o. female admitted on 07/18/2018 with sepsis.  Pharmacy has been consulted for vancomycin and cefepime dosing.  Tested positive yesterday for flu - continues to be weak w/ cough. Afebrile. Scr 1.52 (CrCl 32 mL/min).   Plan: Metronidazole 500 mg IV every 8 hours Cefepime 2 g IV every 24 hours Vancomycin 1500 mg IV once then 1000 mg IV every 24 hours  Monitor renal fx, cx results, clinical pic, and levels as appropriate  Height: 5\' 2"  (157.5 cm) Weight: 162 lb (73.5 kg) IBW/kg (Calculated) : 50.1  Temp (24hrs), Avg:97.7 F (36.5 C), Min:97.7 F (36.5 C), Max:97.7 F (36.5 C)  Recent Labs  Lab 07/11/2018 1912  WBC PENDING  CREATININE 1.52*    Estimated Creatinine Clearance: 32.3 mL/min (A) (by C-G formula based on SCr of 1.52 mg/dL (H)).    Allergies  Allergen Reactions  . Metoprolol     fatigue    Antimicrobials this admission: Vancomycin 2/6 >>  Metronidazole 2/6 >>  Cefepime 2/6>>  Dose adjustments this admission: N/A  Microbiology results: 2/6 BCx: sent  Thank you for allowing pharmacy to be a part of this patient's care.  Antonietta Jewel, PharmD, [place] Clinical Pharmacist  Pager: [phone] Phone: [phone] 07/18/2018 7:43 PM

## 2018-07-15 NOTE — ED Provider Notes (Signed)
[facility] Provider Note   CSN: [id number] Arrival date & time: 07/19/2018  1836     History   Chief Complaint Chief Complaint  Patient presents with  . Influenza    HPI Kristin Moon is a 73 y.o. female.  Patient with onset like flulike illness on Sunday.  Was seen by primary care provider yesterday tested positive for influenza and started on Tamiflu.  This morning she awoke very weak coughing some congestion coughing up some blood but not a large amount.  Decreased appetite feeling very poorly breathing fast.  Patient was started on Tamiflu yesterday.     Past Medical History:  Diagnosis Date  . Chronic diastolic CHF (congestive heart failure) ([place])   . Diastolic heart failure    Grade 1 per echo May 2012  . Hyperlipidemia   . Hypertension   . Hypothyroidism   . Left bundle branch block   . Non Hodgkin's lymphoma ([place])    history  . Non Hodgkin's lymphoma ([place])   . S/P right and left heart catheterization May 2012   minimal CAD. EF of 50%  . Transaminitis     Patient Active Problem List   Diagnosis Date Noted  . Sepsis ([place]) 08/01/2018  . LBBB (left bundle branch block) 12/02/2012  . Chronic diastolic CHF (congestive heart failure) ([place])   . Transaminitis   . Non Hodgkin's lymphoma ([place])   . Hyperlipidemia   . SOB (shortness of breath)   . Hyperthyroidism     Past Surgical History:  Procedure Laterality Date  . ABDOMINAL HYSTERECTOMY  1983  . CARDIAC CATHETERIZATION  10/17/2010    Left ventriculography shows mild global left ventricular hypokinesisThe LVEF is estimated at 50%.  Marland Kitchen FOOT FRACTURE SURGERY       OB History   No obstetric history on file.      Home Medications    Prior to Admission medications   Medication Sig Start Date End Date Taking? Authorizing Provider  carvedilol (COREG) 6.25 MG tablet TAKE 1 TABLET BY MOUTH 2 TIMES DAILY WITH A MEAL. 12/11/16   Nahser, Wonda Cheng, MD  cephALEXin (KEFLEX) 500 MG  capsule Take 1 capsule (500 mg total) by mouth 2 (two) times daily. 06/18/18   Evelina Bucy, DPM  Cholecalciferol (VITAMIN D3) 2000 UNITS TABS Take 1 tablet by mouth daily.     [provider]  estradiol (ESTRACE) 0.5 MG tablet TAKE 1 TABLET BY MOUTH DAILY FOR THREE WEEKS, 1 WEEK OFF 05/07/18   [provider]  estrogens conjugated, synthetic A, (CENESTIN) 0.625 MG tablet Take 0.625 mg by mouth daily.      [provider]  Fe Fum-FePoly-Vit C-Vit B3 (INTEGRA) 62.5-62.5-40-3 MG CAPS Take 1 tablet by mouth daily. 06/18/18   [provider]  fluticasone (FLONASE) 50 MCG/ACT nasal spray Place 1 spray into both nostrils as needed for allergies or rhinitis.    [provider]  furosemide (LASIX) 20 MG tablet Take 1 tablet (20 mg total) by mouth daily. 04/12/18 04/07/19  Nahser, Wonda Cheng, MD  HYDROcodone-acetaminophen (NORCO) 10-325 MG tablet Take 1 tablet by mouth every 4 (four) hours as needed. 06/18/18   Evelina Bucy, DPM  levothyroxine (SYNTHROID, LEVOTHROID) 75 MCG tablet Take 75 mcg by mouth daily. 10/20/14   [provider]  lisinopril (PRINIVIL,ZESTRIL) 2.5 MG tablet TAKE 1 TABLET (2.5 MG TOTAL) BY MOUTH DAILY. 09/18/16   Nahser, Wonda Cheng, MD  lovastatin (MEVACOR) 40 MG tablet Take 40  mg by mouth daily. 12/06/14   [provider]  omeprazole (PRILOSEC OTC) 20 MG tablet Take 20 mg by mouth daily.      [provider]  omeprazole (PRILOSEC) 20 MG capsule  06/24/18   [provider]  potassium chloride SA (K-DUR,KLOR-CON) 20 MEQ tablet Take 1 tablet (20 mEq total) by mouth daily. 04/12/18   Nahser, Wonda Cheng, MD    Family History Family History  Problem Relation Age of Onset  . Heart attack Father 89  . Hypertension Father   . Heart disease Father   . Hypertension Mother     Social History Social History   Tobacco Use  . Smoking status: Never Smoker  . Smokeless tobacco: Never Used  Substance Use Topics  .  Alcohol use: No  . Drug use: No     Allergies   Metoprolol   Review of Systems Review of Systems  Constitutional: Positive for activity change, appetite change and fatigue.  HENT: Positive for congestion. Negative for sore throat.   Eyes: Negative for redness.  Respiratory: Positive for cough and shortness of breath.   Gastrointestinal: Negative for abdominal pain.  Genitourinary: Negative for dysuria.  Musculoskeletal: Positive for myalgias.  Skin: Negative for rash.  Neurological: Negative for headaches.  Hematological: Does not bruise/bleed easily.  Psychiatric/Behavioral: Negative for confusion.     Physical Exam Updated Vital Signs BP (!) 110/58   Pulse (!) 129   Temp 97.7 F (36.5 C) (Oral)   Resp (!) 41   Ht 1.575 m (5\' 2" )   Wt 73.5 kg   SpO2 100%   BMI 29.63 kg/m   Physical Exam Vitals signs and nursing note reviewed.  Constitutional:      General: She is in acute distress.     Appearance: Normal appearance. She is well-developed. She is ill-appearing and toxic-appearing.  HENT:     Head: Normocephalic and atraumatic.     Mouth/Throat:     Mouth: Mucous membranes are dry.  Eyes:     Conjunctiva/sclera: Conjunctivae normal.  Neck:     Musculoskeletal: Normal range of motion and neck supple. No neck rigidity.  Cardiovascular:     Rate and Rhythm: Regular rhythm. Tachycardia present.     Heart sounds: No murmur.  Pulmonary:     Effort: Respiratory distress present.     Breath sounds: Normal breath sounds.  Abdominal:     General: Bowel sounds are normal.     Palpations: Abdomen is soft.     Tenderness: There is no abdominal tenderness.  Musculoskeletal:        General: No swelling.  Skin:    General: Skin is warm and dry.     Capillary Refill: Capillary refill takes less than 2 seconds.  Neurological:     General: No focal deficit present.     Mental Status: She is alert and oriented to person, place, and time.      ED Treatments /  Results  Labs (all labs ordered are listed, but only abnormal results are displayed) Labs Reviewed  COMPREHENSIVE METABOLIC PANEL - Abnormal; Notable for the following components:      Result Value   CO2 19 (*)    Glucose, Bld 114 (*)    BUN 29 (*)    Creatinine, Ser 1.52 (*)    AST 64 (*)    ALT 61 (*)    Alkaline Phosphatase 158 (*)    Total Bilirubin 1.7 (*)    GFR calc non  Af Amer 34 (*)    GFR calc Af Amer 40 (*)    Anion gap 16 (*)    All other components within normal limits  CBC WITH DIFFERENTIAL/PLATELET - Abnormal; Notable for the following components:   RBC 5.37 (*)    MCH 25.7 (*)    RDW 19.3 (*)    All other components within normal limits  URINALYSIS, ROUTINE W REFLEX MICROSCOPIC - Abnormal; Notable for the following components:   APPearance CLOUDY (*)    Ketones, ur 15 (*)    All other components within normal limits  CULTURE, BLOOD (ROUTINE X 2)  CULTURE, BLOOD (ROUTINE X 2)  CULTURE, BLOOD (ROUTINE X 2)  CULTURE, BLOOD (ROUTINE X 2)  LACTIC ACID, PLASMA  PROTIME-INR  LACTIC ACID, PLASMA  INFLUENZA PANEL BY PCR (TYPE A & B)    EKG EKG Interpretation  Date/Time:  Thursday July 15 2018 18:51:33 EST Ventricular Rate:  119 PR Interval:    QRS Duration: 132 QT Interval:  330 QTC Calculation: 465 R Axis:   1 Text Interpretation:  Sinus tachycardia Left bundle branch block Confirmed by Fredia Sorrow [phone]) on 08/03/2018 7:07:19 PM   Radiology Dg Chest Port 1 View  Result Date: 07/22/2018 CLINICAL DATA:  73 year old female with suspected sepsis. Hemoptysis. Diagnosed with flu yesterday. EXAM: PORTABLE CHEST 1 VIEW COMPARISON:  02/16/2018 and earlier. FINDINGS: Portable AP semi upright view at 1931 hours. Large area of consolidation throughout the left lung, only sparing the left lung apex. Air is visible in the left mainstem bronchus and its bifurcation. Visible mediastinal contours appear stable. Visualized tracheal air column is within normal  limits. The right lung appears clear allowing for portable technique. No pneumothorax. No pleural effusion is evident. Paucity bowel gas in the upper abdomen. IMPRESSION: 1. Multilobar left lung Pneumonia.  No pleural effusion is evident. 2. Right lung appears clear. Electronically Signed   By: Genevie Ann M.D.   On: 07/23/2018 20:07    Procedures Procedures (including critical care time)  CRITICAL CARE Performed by: Fredia Sorrow Total critical care time: 30 minutes Critical care time was exclusive of separately billable procedures and treating other patients. Critical care was necessary to treat or prevent imminent or life-threatening deterioration. Critical care was time spent personally by me on the following activities: development of treatment plan with patient and/or surrogate as well as nursing, discussions with consultants, evaluation of patient's response to treatment, examination of patient, obtaining history from patient or surrogate, ordering and performing treatments and interventions, ordering and review of laboratory studies, ordering and review of radiographic studies, pulse oximetry and re-evaluation of patient's condition.   Medications Ordered in ED Medications  sodium chloride flush (NS) 0.9 % injection 3 mL (3 mLs Intravenous Not Given 07/14/2018 1916)  sodium chloride 0.9 % bolus 1,000 mL (0 mLs Intravenous Stopped 07/27/2018 2000)    And  sodium chloride 0.9 % bolus 1,000 mL (1,000 mLs Intravenous New Bag/Given 07/26/2018 2236)    And  sodium chloride 0.9 % bolus 250 mL (has no administration in time range)  metroNIDAZOLE (FLAGYL) IVPB 500 mg ( Intravenous Stopped 07/16/2018 2106)  ceFEPIme (MAXIPIME) 2 g injection (has no administration in time range)  vancomycin (VANCOCIN) IVPB 1000 mg/200 mL premix (has no administration in time range)  ceFEPIme (MAXIPIME) 2 g in sodium chloride 0.9 % 100 mL IVPB (has no administration in time range)  ceFEPIme (MAXIPIME) 2 g in sodium chloride 0.9  % 100 mL IVPB (0 g Intravenous Stopped  08/03/2018 2003)  vancomycin (VANCOCIN) 1,500 mg in sodium chloride 0.9 % 500 mL IVPB (0 mg Intravenous Stopped 08/05/2018 2216)  vancomycin (VANCOCIN) 500 MG powder (500 mg  Given 08/01/2018 1943)  fentaNYL (SUBLIMAZE) injection 12.5 mcg (12.5 mcg Intravenous Given 08/03/2018 2057)  oseltamivir (TAMIFLU) capsule 75 mg (75 mg Oral Given 07/14/2018 2232)  LORazepam (ATIVAN) injection 1 mg (1 mg Intravenous Given 07/13/2018 2146)     Initial Impression / Assessment and Plan / ED Course  I have reviewed the triage vital signs and the nursing notes.  Pertinent labs & imaging results that were available during my care of the patient were reviewed by me and considered in my medical decision making (see chart for details).    Patient presented with vital signs consistent with sepsis although was not febrile.  Patient was diagnosed with influenza yesterday started Tamiflu yesterday.  But patient felt significantly worse today.  Came in in respiratory distress.  Tachycardic tachypneic blood pressure is a little marginal at 92 systolic.  Sepsis protocol started.  On room air satting 91%.  Not normally on oxygen immediately started on 2 L which brought her oxygen levels up some.  But she is continued to breathe fast.  The patient was started on BiPAP to rest her.  Even after being on the BiPAP continued to be tachycardic and respiratory rate was up but patient felt better on the BiPAP.  Mental status was always fine.  Patient received broad-spectrum antibiotics.  Also received a dose of Tamiflu here.  Patient vital signs remained fine from a blood pressure standpoint never got hypotensive.  After receiving fluid blood pressure came up some.  But she still remained tachycardic and tachypneic.  Discussed with hospitalist who will admit.  Patient while here did not have any indication for intubation however she possibly could tire out overnight.  But hopefully the BiPAP will help control her.   Patient also had a complaint of left-sided pleuritic type chest pain chest x-ray is consistent with pneumonia on the left side.  Patient will be admitted to the stepdown unit at [facility].   Final Clinical Impressions(s) / ED Diagnoses   Final diagnoses:  Influenza  Hypoxia  Community acquired pneumonia of left lower lobe of lung [facility])    ED Discharge Orders    None       Fredia Sorrow, MD 07/18/2018 2338

## 2018-07-15 NOTE — ED Notes (Signed)
[facility] notified (Tammy) - patient ready for transport 

## 2018-07-15 NOTE — ED Notes (Signed)
Pt with labored breathing RT to room pain med held at this time

## 2018-07-16 ENCOUNTER — Inpatient Hospital Stay (HOSPITAL_COMMUNITY): Payer: Medicare Other

## 2018-07-16 DIAGNOSIS — A419 Sepsis, unspecified organism: Secondary | ICD-10-CM

## 2018-07-16 DIAGNOSIS — J9600 Acute respiratory failure, unspecified whether with hypoxia or hypercapnia: Secondary | ICD-10-CM

## 2018-07-16 DIAGNOSIS — R0902 Hypoxemia: Secondary | ICD-10-CM

## 2018-07-16 DIAGNOSIS — Z978 Presence of other specified devices: Secondary | ICD-10-CM

## 2018-07-16 DIAGNOSIS — J80 Acute respiratory distress syndrome: Secondary | ICD-10-CM

## 2018-07-16 DIAGNOSIS — Z452 Encounter for adjustment and management of vascular access device: Secondary | ICD-10-CM

## 2018-07-16 DIAGNOSIS — R6521 Severe sepsis with septic shock: Secondary | ICD-10-CM

## 2018-07-16 DIAGNOSIS — J9601 Acute respiratory failure with hypoxia: Secondary | ICD-10-CM

## 2018-07-16 LAB — CBC WITH DIFFERENTIAL/PLATELET
Abs Immature Granulocytes: 0.07 10*3/uL (ref 0.00–0.07)
Abs Immature Granulocytes: 0.2 10*3/uL — ABNORMAL HIGH (ref 0.00–0.07)
BAND NEUTROPHILS: 1 %
BASOS ABS: 0 10*3/uL (ref 0.0–0.1)
Basophils Absolute: 0 10*3/uL (ref 0.0–0.1)
Basophils Absolute: 0 10*3/uL (ref 0.0–0.1)
Basophils Relative: 0 %
Basophils Relative: 0 %
Basophils Relative: 0 %
Eosinophils Absolute: 0 10*3/uL (ref 0.0–0.5)
Eosinophils Absolute: 0 10*3/uL (ref 0.0–0.5)
Eosinophils Absolute: 0 10*3/uL (ref 0.0–0.5)
Eosinophils Relative: 0 %
Eosinophils Relative: 0 %
Eosinophils Relative: 0 %
HCT: 25.9 % — ABNORMAL LOW (ref 36.0–46.0)
HCT: 36.7 % (ref 36.0–46.0)
HCT: 36.9 % (ref 36.0–46.0)
Hemoglobin: 7.7 g/dL — ABNORMAL LOW (ref 12.0–15.0)
Hemoglobin: 10.7 g/dL — ABNORMAL LOW (ref 12.0–15.0)
Hemoglobin: 10.9 g/dL — ABNORMAL LOW (ref 12.0–15.0)
Immature Granulocytes: 1 %
Lymphocytes Relative: 11 %
Lymphocytes Relative: 2 %
Lymphocytes Relative: 4 %
Lymphs Abs: 1 10*3/uL (ref 0.7–4.0)
Lymphs Abs: 0.4 10*3/uL — ABNORMAL LOW (ref 0.7–4.0)
Lymphs Abs: 0.8 10*3/uL (ref 0.7–4.0)
MCH: 26.1 pg (ref 26.0–34.0)
MCH: 25.4 pg — ABNORMAL LOW (ref 26.0–34.0)
MCH: 25.6 pg — AB (ref 26.0–34.0)
MCHC: 29.7 g/dL — ABNORMAL LOW (ref 30.0–36.0)
MCHC: 29.2 g/dL — ABNORMAL LOW (ref 30.0–36.0)
MCHC: 29.5 g/dL — AB (ref 30.0–36.0)
MCV: 87.8 fL (ref 80.0–100.0)
MCV: 87.2 fL (ref 80.0–100.0)
MCV: 86.8 fL (ref 80.0–100.0)
MYELOCYTES: 1 %
Metamyelocytes Relative: 4 %
Metamyelocytes Relative: 1 %
Monocytes Absolute: 0.3 10*3/uL (ref 0.1–1.0)
Monocytes Absolute: 0.6 10*3/uL (ref 0.1–1.0)
Monocytes Absolute: 0.8 10*3/uL (ref 0.1–1.0)
Monocytes Relative: 4 %
Monocytes Relative: 3 %
Monocytes Relative: 4 %
NEUTROS ABS: 17.7 10*3/uL — AB (ref 1.7–7.7)
Neutro Abs: 7.1 10*3/uL (ref 1.7–7.7)
Neutro Abs: 18.5 10*3/uL — ABNORMAL HIGH (ref 1.7–7.7)
Neutrophils Relative %: 84 %
Neutrophils Relative %: 89 %
Neutrophils Relative %: 91 %
Platelets: 124 10*3/uL — ABNORMAL LOW (ref 150–400)
Platelets: 205 10*3/uL (ref 150–400)
Platelets: 204 10*3/uL (ref 150–400)
RBC: 2.95 MIL/uL — ABNORMAL LOW (ref 3.87–5.11)
RBC: 4.21 MIL/uL (ref 3.87–5.11)
RBC: 4.25 MIL/uL (ref 3.87–5.11)
RDW: 19.3 % — AB (ref 11.5–15.5)
RDW: 19.2 % — ABNORMAL HIGH (ref 11.5–15.5)
RDW: 19.5 % — ABNORMAL HIGH (ref 11.5–15.5)
WBC: 8.5 10*3/uL (ref 4.0–10.5)
WBC: 19.5 10*3/uL — ABNORMAL HIGH (ref 4.0–10.5)
WBC: 19.4 10*3/uL — ABNORMAL HIGH (ref 4.0–10.5)
nRBC: 0 % (ref 0.0–0.2)
nRBC: 0 % (ref 0.0–0.2)
nRBC: 0 % (ref 0.0–0.2)
nRBC: 0 /100 WBC

## 2018-07-16 LAB — COOXEMETRY PANEL
Carboxyhemoglobin: 0.9 % (ref 0.5–1.5)
Methemoglobin: 1.7 % — ABNORMAL HIGH (ref 0.0–1.5)
O2 Saturation: 82.1 %
Total hemoglobin: 11.3 g/dL — ABNORMAL LOW (ref 12.0–16.0)

## 2018-07-16 LAB — BLOOD GAS, ARTERIAL
Acid-base deficit: 10 mmol/L — ABNORMAL HIGH (ref 0.0–2.0)
Acid-base deficit: 9 mmol/L — ABNORMAL HIGH (ref 0.0–2.0)
Acid-base deficit: 9.7 mmol/L — ABNORMAL HIGH (ref 0.0–2.0)
BICARBONATE: 16.4 mmol/L — AB (ref 20.0–28.0)
Bicarbonate: 15.9 mmol/L — ABNORMAL LOW (ref 20.0–28.0)
Bicarbonate: 16.6 mmol/L — ABNORMAL LOW (ref 20.0–28.0)
Delivery systems: POSITIVE
Drawn by: 25203
Expiratory PAP: 6
FIO2: 0.6
FIO2: 100
FIO2: 100
Inspiratory PAP: 12
MECHVT: 350 mL
MECHVT: 350 mL
O2 Saturation: 73.6 %
O2 Saturation: 99 %
O2 Saturation: 99.1 %
PATIENT TEMPERATURE: 98.6
PEEP: 10 cmH2O
PEEP: 10 cmH2O
Patient temperature: 103
Patient temperature: 98.6
RATE: 22 resp/min
RATE: 22 resp/min
pCO2 arterial: 37.7 mmHg (ref 32.0–48.0)
pCO2 arterial: 40 mmHg (ref 32.0–48.0)
pCO2 arterial: 42.6 mmHg (ref 32.0–48.0)
pH, Arterial: 7.21 — ABNORMAL LOW (ref 7.350–7.450)
pH, Arterial: 7.238 — ABNORMAL LOW (ref 7.350–7.450)
pH, Arterial: 7.267 — ABNORMAL LOW (ref 7.350–7.450)
pO2, Arterial: 191 mmHg — ABNORMAL HIGH (ref 83.0–108.0)
pO2, Arterial: 259 mmHg — ABNORMAL HIGH (ref 83.0–108.0)
pO2, Arterial: 50.9 mmHg — ABNORMAL LOW (ref 83.0–108.0)

## 2018-07-16 LAB — GLUCOSE, CAPILLARY
Glucose-Capillary: 92 mg/dL (ref 70–99)
Glucose-Capillary: 115 mg/dL — ABNORMAL HIGH (ref 70–99)
Glucose-Capillary: 115 mg/dL — ABNORMAL HIGH (ref 70–99)
Glucose-Capillary: 157 mg/dL — ABNORMAL HIGH (ref 70–99)

## 2018-07-16 LAB — TSH: TSH: 0.279 u[IU]/mL — ABNORMAL LOW (ref 0.350–4.500)

## 2018-07-16 LAB — URINALYSIS, ROUTINE W REFLEX MICROSCOPIC
Bilirubin Urine: NEGATIVE
Glucose, UA: NEGATIVE mg/dL
Ketones, ur: NEGATIVE mg/dL
Leukocytes, UA: NEGATIVE
NITRITE: NEGATIVE
Protein, ur: 100 mg/dL — AB
Specific Gravity, Urine: 1.023 (ref 1.005–1.030)
pH: 5 (ref 5.0–8.0)

## 2018-07-16 LAB — BASIC METABOLIC PANEL
Anion gap: 9 (ref 5–15)
BUN: 29 mg/dL — ABNORMAL HIGH (ref 8–23)
CHLORIDE: 113 mmol/L — AB (ref 98–111)
CO2: 17 mmol/L — ABNORMAL LOW (ref 22–32)
Calcium: 7.6 mg/dL — ABNORMAL LOW (ref 8.9–10.3)
Creatinine, Ser: 1.2 mg/dL — ABNORMAL HIGH (ref 0.44–1.00)
GFR calc Af Amer: 53 mL/min — ABNORMAL LOW (ref >60)
GFR calc non Af Amer: 46 mL/min — ABNORMAL LOW (ref >60)
Glucose, Bld: 138 mg/dL — ABNORMAL HIGH (ref 70–99)
POTASSIUM: 4.9 mmol/L (ref 3.5–5.1)
Sodium: 139 mmol/L (ref 135–145)

## 2018-07-16 LAB — RESPIRATORY PANEL BY PCR
Adenovirus: NOT DETECTED
Bordetella pertussis: NOT DETECTED
CHLAMYDOPHILA PNEUMONIAE-RVPPCR: NOT DETECTED
Coronavirus 229E: NOT DETECTED
Coronavirus HKU1: NOT DETECTED
Coronavirus NL63: NOT DETECTED
Coronavirus OC43: NOT DETECTED
Influenza A: NOT DETECTED
Influenza B: DETECTED — AB
Metapneumovirus: NOT DETECTED
Mycoplasma pneumoniae: NOT DETECTED
Parainfluenza Virus 1: NOT DETECTED
Parainfluenza Virus 2: NOT DETECTED
Parainfluenza Virus 3: NOT DETECTED
Parainfluenza Virus 4: NOT DETECTED
RHINOVIRUS / ENTEROVIRUS - RVPPCR: NOT DETECTED
Respiratory Syncytial Virus: NOT DETECTED

## 2018-07-16 LAB — HIV ANTIBODY (ROUTINE TESTING W REFLEX): HIV Screen 4th Generation wRfx: NONREACTIVE

## 2018-07-16 LAB — COMPREHENSIVE METABOLIC PANEL
ALK PHOS: 53 U/L (ref 38–126)
ALT: 26 U/L (ref 0–44)
AST: 22 U/L (ref 15–41)
Albumin: 1.3 g/dL — ABNORMAL LOW (ref 3.5–5.0)
Anion gap: 5 (ref 5–15)
BUN: 20 mg/dL (ref 8–23)
CO2: 12 mmol/L — ABNORMAL LOW (ref 22–32)
Calcium: 4.6 mg/dL — CL (ref 8.9–10.3)
Chloride: 126 mmol/L — ABNORMAL HIGH (ref 98–111)
Creatinine, Ser: 0.8 mg/dL (ref 0.44–1.00)
GFR calc Af Amer: >60 mL/min (ref >60)
GFR calc non Af Amer: >60 mL/min (ref >60)
Glucose, Bld: 85 mg/dL (ref 70–99)
Potassium: 2.9 mmol/L — ABNORMAL LOW (ref 3.5–5.1)
Sodium: 143 mmol/L (ref 135–145)
Total Bilirubin: 0.7 mg/dL (ref 0.3–1.2)
Total Protein: <3 g/dL — ABNORMAL LOW (ref 6.5–8.1)

## 2018-07-16 LAB — BODY FLUID CELL COUNT WITH DIFFERENTIAL
Eos, Fluid: 0 %
Lymphs, Fluid: 7 %
Lymphs, Fluid: 7 %
Monocyte-Macrophage-Serous Fluid: 21 % — ABNORMAL LOW (ref 50–90)
Monocyte-Macrophage-Serous Fluid: 11 % — ABNORMAL LOW (ref 50–90)
Neutrophil Count, Fluid: 72 % — ABNORMAL HIGH (ref 0–25)
Neutrophil Count, Fluid: 82 % — ABNORMAL HIGH (ref 0–25)
Total Nucleated Cell Count, Fluid: 11250 cu mm — ABNORMAL HIGH (ref 0–1000)
Total Nucleated Cell Count, Fluid: 1650 cu mm — ABNORMAL HIGH (ref 0–1000)

## 2018-07-16 LAB — PHOSPHORUS: Phosphorus: 2.8 mg/dL (ref 2.5–4.6)

## 2018-07-16 LAB — CORTISOL: Cortisol, Plasma: 34.4 ug/dL

## 2018-07-16 LAB — PROTEIN, TOTAL: Total Protein: 5.4 g/dL — ABNORMAL LOW (ref 6.5–8.1)

## 2018-07-16 LAB — MAGNESIUM
MAGNESIUM: 1 mg/dL — AB (ref 1.7–2.4)
Magnesium: 1.4 mg/dL — ABNORMAL LOW (ref 1.7–2.4)

## 2018-07-16 LAB — TRIGLYCERIDES: Triglycerides: 108 mg/dL (ref <150)

## 2018-07-16 LAB — LACTIC ACID, PLASMA
Lactic Acid, Venous: 2.5 mmol/L (ref 0.5–1.9)
Lactic Acid, Venous: 2.3 mmol/L (ref 0.5–1.9)

## 2018-07-16 LAB — LACTATE DEHYDROGENASE: LDH: 132 U/L (ref 98–192)

## 2018-07-16 LAB — STREP PNEUMONIAE URINARY ANTIGEN: Strep Pneumo Urinary Antigen: POSITIVE — AB

## 2018-07-16 LAB — CHOLESTEROL, TOTAL: Cholesterol: 80 mg/dL (ref 0–200)

## 2018-07-16 LAB — MRSA PCR SCREENING: MRSA by PCR: NEGATIVE

## 2018-07-16 LAB — INFLUENZA PANEL BY PCR (TYPE A & B)
Influenza A By PCR: NEGATIVE
Influenza B By PCR: POSITIVE — AB

## 2018-07-16 MED ORDER — ETOMIDATE 2 MG/ML IV SOLN
20.0000 mg | Freq: Once | INTRAVENOUS | Status: AC
  Administered 2018-07-16: 20 mg via INTRAVENOUS
  Filled 2018-07-16: qty 10

## 2018-07-16 MED ORDER — POTASSIUM CHLORIDE 10 MEQ/50ML IV SOLN: 10.0000 meq | INTRAVENOUS | Status: DC

## 2018-07-16 MED ORDER — MIDAZOLAM HCL 2 MG/2ML IJ SOLN: 1.0000 mg | INTRAMUSCULAR | Status: DC | PRN

## 2018-07-16 MED ORDER — MIDAZOLAM HCL 2 MG/2ML IJ SOLN
2.0000 mg | Freq: Once | INTRAMUSCULAR | Status: AC
  Administered 2018-07-16: 2 mg via INTRAVENOUS
  Filled 2018-07-16: qty 2

## 2018-07-16 MED ORDER — POTASSIUM CHLORIDE 20 MEQ/15ML (10%) PO SOLN: 40.0000 meq | Freq: Once | ORAL | Status: DC

## 2018-07-16 MED ORDER — CALCIUM GLUCONATE-NACL 2-0.675 GM/100ML-% IV SOLN
2.0000 g | Freq: Once | INTRAVENOUS | Status: AC
  Administered 2018-07-16: 2000 mg via INTRAVENOUS
  Filled 2018-07-16: qty 100

## 2018-07-16 MED ORDER — FENTANYL BOLUS VIA INFUSION
25.0000 ug | INTRAVENOUS | Status: DC | PRN
  Administered 2018-07-16 (×2): 25 ug via INTRAVENOUS
  Administered 2018-07-16: 50 ug via INTRAVENOUS
  Administered 2018-07-18 – 2018-07-19 (×2): 25 ug via INTRAVENOUS
  Filled 2018-07-16: qty 25

## 2018-07-16 MED ORDER — VITAL AF 1.2 CAL PO LIQD
1000.0000 mL | ORAL | Status: DC
  Administered 2018-07-16 – 2018-07-21 (×4): 1000 mL

## 2018-07-16 MED ORDER — PROPOFOL 1000 MG/100ML IV EMUL: 5.0000 ug/kg/min | INTRAVENOUS | Status: DC

## 2018-07-16 MED ORDER — ACETAMINOPHEN 325 MG PO TABS
650.0000 mg | ORAL_TABLET | ORAL | Status: DC | PRN
  Administered 2018-07-17: 650 mg via ORAL
  Filled 2018-07-16: qty 2

## 2018-07-16 MED ORDER — MAGNESIUM SULFATE 2 GM/50ML IV SOLN
2.0000 g | Freq: Once | INTRAVENOUS | Status: AC
  Administered 2018-07-16: 2 g via INTRAVENOUS
  Filled 2018-07-16: qty 50

## 2018-07-16 MED ORDER — SODIUM CHLORIDE 0.9 % IV SOLN
INTRAVENOUS | Status: DC
  Administered 2018-07-16 – 2018-07-19 (×2): via INTRAVENOUS

## 2018-07-16 MED ORDER — HYDROCORTISONE NA SUCCINATE PF 100 MG IJ SOLR
50.0000 mg | Freq: Four times a day (QID) | INTRAMUSCULAR | Status: DC
  Administered 2018-07-16 – 2018-07-19 (×14): 50 mg via INTRAVENOUS
  Filled 2018-07-16 (×14): qty 2

## 2018-07-16 MED ORDER — NOREPINEPHRINE 4 MG/250ML-% IV SOLN
0.0000 ug/min | INTRAVENOUS | Status: DC
  Administered 2018-07-16: 20 ug/min via INTRAVENOUS
  Administered 2018-07-16: 15 ug/min via INTRAVENOUS
  Administered 2018-07-16: 5 ug/min via INTRAVENOUS
  Filled 2018-07-16 (×2): qty 250

## 2018-07-16 MED ORDER — PHENYLEPHRINE 40 MCG/ML (10ML) SYRINGE FOR IV PUSH (FOR BLOOD PRESSURE SUPPORT)
400.0000 ug | PREFILLED_SYRINGE | Freq: Once | INTRAVENOUS | Status: AC
  Administered 2018-07-16: 400 ug via INTRAVENOUS
  Filled 2018-07-16: qty 10

## 2018-07-16 MED ORDER — ORAL CARE MOUTH RINSE
15.0000 mL | OROMUCOSAL | Status: DC
  Administered 2018-07-16 – 2018-07-29 (×126): 15 mL via OROMUCOSAL

## 2018-07-16 MED ORDER — OSELTAMIVIR PHOSPHATE 6 MG/ML PO SUSR
30.0000 mg | Freq: Two times a day (BID) | ORAL | Status: DC
  Administered 2018-07-16 – 2018-07-19 (×7): 30 mg
  Filled 2018-07-16 (×7): qty 12.5

## 2018-07-16 MED ORDER — MIDAZOLAM HCL 2 MG/2ML IJ SOLN
1.0000 mg | INTRAMUSCULAR | Status: DC | PRN
  Filled 2018-07-16: qty 2

## 2018-07-16 MED ORDER — FAMOTIDINE IN NACL 20-0.9 MG/50ML-% IV SOLN
20.0000 mg | Freq: Two times a day (BID) | INTRAVENOUS | Status: DC
  Administered 2018-07-16 – 2018-07-19 (×8): 20 mg via INTRAVENOUS
  Filled 2018-07-16 (×8): qty 50

## 2018-07-16 MED ORDER — FENTANYL 2500MCG IN NS 250ML (10MCG/ML) PREMIX INFUSION
25.0000 ug/h | INTRAVENOUS | Status: DC
  Administered 2018-07-16: 50 ug/h via INTRAVENOUS
  Administered 2018-07-16 – 2018-07-17 (×2): 175 ug/h via INTRAVENOUS
  Administered 2018-07-18: 150 ug/h via INTRAVENOUS
  Administered 2018-07-19: 50 ug/h via INTRAVENOUS
  Filled 2018-07-16 (×7): qty 250

## 2018-07-16 MED ORDER — FENTANYL CITRATE (PF) 100 MCG/2ML IJ SOLN
50.0000 ug | Freq: Once | INTRAMUSCULAR | Status: AC
  Administered 2018-07-16: 50 ug via INTRAVENOUS
  Filled 2018-07-16: qty 2

## 2018-07-16 MED ORDER — HEPARIN SODIUM (PORCINE) 5000 UNIT/ML IJ SOLN
5000.0000 [IU] | Freq: Three times a day (TID) | INTRAMUSCULAR | Status: DC
  Administered 2018-07-16 – 2018-07-29 (×39): 5000 [IU] via SUBCUTANEOUS
  Filled 2018-07-16 (×39): qty 1

## 2018-07-16 MED ORDER — FENTANYL CITRATE (PF) 100 MCG/2ML IJ SOLN
50.0000 ug | Freq: Once | INTRAMUSCULAR | Status: AC
  Administered 2018-07-16: 50 ug via INTRAVENOUS

## 2018-07-16 MED ORDER — CHLORHEXIDINE GLUCONATE 0.12% ORAL RINSE (MEDLINE KIT)
15.0000 mL | Freq: Two times a day (BID) | OROMUCOSAL | Status: DC
  Administered 2018-07-16 – 2018-07-29 (×27): 15 mL via OROMUCOSAL

## 2018-07-16 MED ORDER — ALBUTEROL SULFATE (2.5 MG/3ML) 0.083% IN NEBU
2.5000 mg | INHALATION_SOLUTION | RESPIRATORY_TRACT | Status: DC
  Administered 2018-07-16 – 2018-07-28 (×72): 2.5 mg via RESPIRATORY_TRACT
  Filled 2018-07-16 (×70): qty 3

## 2018-07-16 MED ORDER — VITAL HIGH PROTEIN PO LIQD: 1000.0000 mL | ORAL | Status: DC

## 2018-07-16 MED ORDER — NOREPINEPHRINE 16 MG/250ML-% IV SOLN
0.0000 ug/min | INTRAVENOUS | Status: DC
  Administered 2018-07-16: 22 ug/min via INTRAVENOUS
  Filled 2018-07-16 (×2): qty 250

## 2018-07-16 MED ORDER — SODIUM CHLORIDE 0.9 % IV SOLN
2.0000 g | Freq: Two times a day (BID) | INTRAVENOUS | Status: DC
  Administered 2018-07-16 – 2018-07-19 (×7): 2 g via INTRAVENOUS
  Filled 2018-07-16 (×8): qty 2

## 2018-07-16 MED ORDER — VASOPRESSIN 20 UNIT/ML IV SOLN
0.0300 [IU]/min | INTRAVENOUS | Status: DC
  Administered 2018-07-16 – 2018-07-17 (×2): 0.03 [IU]/min via INTRAVENOUS
  Filled 2018-07-16 (×2): qty 2

## 2018-07-16 MED ORDER — NOREPINEPHRINE 4 MG/250ML-% IV SOLN
INTRAVENOUS | Status: AC
  Administered 2018-07-16: 5 ug/min via INTRAVENOUS
  Filled 2018-07-16: qty 250

## 2018-07-16 MED ORDER — SODIUM CHLORIDE 0.9 % IV SOLN: INTRAVENOUS | Status: DC | PRN

## 2018-07-16 MED FILL — Fentanyl Citrate Preservative Free (PF) Inj 2500 MCG/50ML: INTRAMUSCULAR | Qty: 50 | Status: AC

## 2018-07-16 MED FILL — Sodium Chloride IV Soln 0.9%: INTRAVENOUS | Qty: 200 | Status: AC

## 2018-07-16 NOTE — Progress Notes (Signed)
CRITICAL VALUE ALERT  Critical Value: Calcium   Date & Time Notied:  07/16/2018 04:18   Provider Notified: Warren Lacy RN and bedside RN   Orders Received/Actions taken: Elink RN will notify MD

## 2018-07-16 NOTE — Procedures (Signed)
Bronchoscopy Procedure Note KALEI MEDA [id number] May 19, 1946  Procedure: Bronchoscopy Indications: Diagnostic evaluation of the airways and Obtain specimens for culture and/or other diagnostic studies  Procedure Details Consent: Risks of procedure as well as the alternatives and risks of each were explained to the (patient/caregiver).  Consent for procedure obtained. Time Out: Verified patient identification, verified procedure, site/side was marked, verified correct patient position, special equipment/implants available, medications/allergies/relevent history reviewed, required imaging and test results available.  Performed  In preparation for procedure, patient was given 100% FiO2 and bronchoscope lubricated. Sedation: Etomidate and Short-acting barbiturates  Airway entered and the following bronchi were examined: RUL, RML, RLL, LUL, LLL and Bronchi.   Purulent secretions mucous plug noted from right lower lobe is cleared with suctioning.  BAL performed in the right lower lobe and specimen sent for culture  Bronchoscope removed.  , Patient placed back on 100% FiO2 at conclusion of procedure.    Evaluation Hemodynamic Status: BP stable throughout; O2 sats: stable throughout Patient's Current Condition: stable Specimens:  Sent purulent fluid Complications: No apparent complications Patient did tolerate procedure well.   Preciliano Castell 07/16/2018

## 2018-07-16 NOTE — Procedures (Signed)
Central Venous Catheter Insertion Procedure Note Kristin Moon [id number] [birth year]  Procedure: Insertion of Central Venous Catheter Indications: Assessment of intravascular volume, Drug and/or fluid administration and Frequent blood sampling  Procedure Details Consent: Unable to obtain consent because of altered level of consciousness. Time Out: Verified patient identification, verified procedure, site/side was marked, verified correct patient position, special equipment/implants available, medications/allergies/relevent history reviewed, required imaging and test results available.  Performed  Maximum sterile technique was used including antiseptics, cap, gloves, gown, hand hygiene, mask and sheet. Skin prep: Chlorhexidine; local anesthetic administered A antimicrobial bonded/coated triple lumen catheter was placed in the left internal jugular vein using the Seldinger technique.  Evaluation Blood flow good Complications: No apparent complications Patient did tolerate procedure well. Chest X-ray ordered to verify placement.  CXR: normal.  Procedure performed under direct ultrasound guidance for real time vessel cannulation.      Montey Hora, [facility] Pager: [phone]  or [phone] 07/16/2018, 2:31 AM

## 2018-07-16 NOTE — Care Management (Signed)
This is a no charge note  73 year old lady with a past medical history of CHF, left bundle blockage, NHL, hypothyroidism, hypertension, hyperlipidemia, who is transfered from [facility] due to acute respiratory distress.  Patient was diagnosed with flu and chest x-ray showed multifocal pneumonia.  Patient is septic.  Patient was started with BiPAP, but her has been deteriorating.  When I saw patient on the floor, patient is in severe respiratory distress on BiPAP, with respiratory rate up to upper 40s.  PCCM was consulted. Dr. Gilford Raid quickly evaluated patient, and she decided that patient needs to be intubated.  Patient will be transferred to ICU.   Ivor Costa, MD  [facility]   If 7PM-7AM, please contact night-coverage [email or web address] Password [facility] 07/16/2018, 12:48 AM

## 2018-07-16 NOTE — Progress Notes (Signed)
Pharmacy Antibiotic Note  Kristin Moon is a 73 y.o. female admitted on 07/30/2018 with pneumona. Pharmacy has been consulted for cefepime dosing. She is fluB+ on Tamiflu and has a positive. S. pneumo UAT. Continue cefepime for now per CCM. WBC WNL, AF. Scr improved to 1.2, estimated CrCl ~44 mL/min.  Plan: Increase to cefepime 2 g IV q12h F/u clinical status, renal function, de-escalation, LOT  Height: 5\' 5"  (165.1 cm) Weight: 163 lb 2.3 oz (74 kg) IBW/kg (Calculated) : 57  Temp (24hrs), Avg:98.6 F (37 C), Min:97.7 F (36.5 C), Max:99.2 F (37.3 C)  Recent Labs  Lab 07/24/2018 1912 07/16/18 0224 07/16/18 0348 07/16/18 0552  WBC 8.5 8.5  --   --   CREATININE 1.52* 0.80  --  1.20*  LATICACIDVEN 1.6 2.5* 2.3*  --     Estimated Creatinine Clearance: 43.9 mL/min (A) (by C-G formula based on SCr of 1.2 mg/dL (H)).    Allergies  Allergen Reactions  . Metoprolol     fatigue   Antimicrobials this admission: Vancomycin 2/6 >> 2/7 Metronidazole 2/6 >> 2/7 Cefepime 2/6 >>  Microbiology results: 2/6BCx: NG <12h 2/7 RVP: fluB+ 2/7 MRSA PCR: neg 2/7 S. pneumo antigen: positive  Thank you for allowing pharmacy to be a part of this patient's care.  Mila Merry Gerarda Fraction, PharmD, [place] PGY2 Infectious Diseases Pharmacy Resident Phone: [phone] 07/16/2018 11:04 AM

## 2018-07-16 NOTE — Procedures (Signed)
Bedside Bronchoscopy Procedure Note Kristin Moon [id number] 1946/03/06  Procedure: Bronchoscopy Indications: Diagnostic evaluation of the airways  Procedure Details: ET Tube Size: ET Tube secured at lip (cm): Bite block in place: No In preparation for procedure, Patient hyper-oxygenated with 100 % FiO2 Airway entered and the following bronchi were examined: RUL LUL.   Bronchoscope removed.    Evaluation BP (!) 148/41   Pulse 94   Temp 99.2 F (37.3 C) (Oral)   Resp 20   Ht 5\' 5"  (1.651 m)   Wt 74 kg   SpO2 100%   BMI 27.15 kg/m  Breath Sounds:Clear and Diminished O2 sats: stable throughout Patient's Current Condition: stable Specimens:  Sent serosanguinous fluid Complications: No apparent complications Patient did tolerate procedure well.   Bronch tip examined post procedure and still intact Rudene Re 07/16/2018, 3:40 PM

## 2018-07-16 NOTE — Progress Notes (Signed)
Pt arrived at [place] by [place] and placed on BiPAP per pt's prior settings. Pt cold, clammy, anxious with temp of 103 rectal.  Rapid RN to bedside along with Dr. Blaine Hamper. Stat ABG obtained then Dr. Gilford Raid at bedside and orders given to move pt to ICU. Pt moved to ICU on BiPAP.  Once in ICU, pt intubated without complications by MD and placed on vent. See flowsheet.

## 2018-07-16 NOTE — Progress Notes (Signed)
Initial Nutrition Assessment  DOCUMENTATION CODES:   Not applicable  INTERVENTION:   -Initiate TF via OGT with Vital AF 1.2 at goal rate of 55 ml/h (1320 ml per day) to provide 1584 kcals, 99 gm protein, 1071 ml free water daily.  NUTRITION DIAGNOSIS:   Inadequate oral intake related to inability to eat as evidenced by NPO status.  GOAL:   Patient will meet greater than or equal to 90% of their needs  MONITOR:   Vent status, Labs, Weight trends, TF tolerance, Skin, I & O's  REASON FOR ASSESSMENT:   Ventilator, Consult Enteral/tube feeding initiation and management  ASSESSMENT:   73 yr old female w/ PMHx of NHL, HLD, HTN, Hypothyroidism, LBBB, CAD G1DD presented from home via EMS after coughing up blood and feeling weak, Tested flu positive at her PMD visit on 07/14/2018.  Pt admitted with acute hypoxic respiratory failure and multilobar pneumonia.  2/7- intubated, s/p lt thoracentesis- 650 ml pleural fluid obtained- cytology pending  Patient is currently intubated on ventilator support MV: 7.4 L/min Temp (24hrs), Avg:99 F (37.2 C), Min:97.7 F (36.5 C), Max:100.1 F (37.8 C)  Spoke with pt's son at bedside, who reports that pt was very active PTA and at baseline (pt works almost daily as a Oceanographer). Pt son unsure of what pt typically eats at home, but denies any changes in eating habits or weight status that he has observed. He has not noticed any changes in her physical appearance.   Discussed with son how pt will receive nutrition while she is on the ventilator.   Labs reviewed: Mg: 1.4.   NUTRITION - FOCUSED PHYSICAL EXAM:    Most Recent Value  Orbital Region  No depletion  Upper Arm Region  No depletion  Thoracic and Lumbar Region  No depletion  Buccal Region  No depletion  Temple Region  No depletion  Clavicle Bone Region  No depletion  Clavicle and Acromion Bone Region  No depletion  Scapular Bone Region  No depletion  Dorsal Hand  No  depletion  Patellar Region  No depletion  Anterior Thigh Region  No depletion  Posterior Calf Region  No depletion  Edema (RD Assessment)  None  Hair  Reviewed  Eyes  Reviewed  Mouth  Reviewed  Skin  Reviewed  Nails  Reviewed       Diet Order:   Diet Order            Diet NPO time specified  Diet effective now              EDUCATION NEEDS:   Education needs have been addressed  Skin:  Skin Assessment: Reviewed RN Assessment  Last BM:  07/16/18  Height:   Ht Readings from Last 1 Encounters:  07/16/18 5\' 5"  (1.651 m)    Weight:   Wt Readings from Last 1 Encounters:  07/16/18 74 kg    Ideal Body Weight:  56.8 kg  BMI:  Body mass index is 27.15 kg/m.  Estimated Nutritional Needs:   Kcal:  1545  Protein:  95-110 grams  Fluid:  >1.5 L    Nile Prisk A. Jimmye Norman, RD, LDN, CDE Pager: [phone] After hours Pager: [phone]

## 2018-07-16 NOTE — H&P (Signed)
..   NAME:  Kristin Moon, MRN:  [id number], DOB:  08/20/1945, LOS: 1 ADMISSION DATE:  07/18/2018, CONSULTATION DATE:  07/16/2018 REFERRING MD:  [facility] Hospitalist, CHIEF COMPLAINT:  SOB and fever   Brief History    73 yr old female w/ PMHx of NHL, HLD, HTN, Hypothyroidism, LBBB, CAD G1DD presented from home via EMS after coughing up blood and feeling weak, Tested flu positive at her PMD visit on 07/14/2018. History of present illness   73 yr old female w/ PMHx NHL (per son last radiation Rx was 52 yrs ago), HLD, HTN, Hypothyroidism, LBBB, CAD, G1DD (EF 50%) w/ a history of pneumonia last summer/fall not requiring hospitalization.  She f/u with her doctors Q 6 mos.  Per her son for the last few days ( since 07/11/2018) she has not felt well had fevers, congestion, generalized weakness and a cough.  She went to see her PMD on 2./10/2018 and was diagnosed with the flu and started on Tamiflu.  Pt continued to worsen at home with more labored breathing and increased cough, EMS was called after she began coughing up a small amount of blood.  Per [facility] EDP note: Came in in respiratory distress.  Tachycardic tachypneic blood pressure is a little marginal at 92 systolic.  Sepsis protocol started.  On room air satting 91%.  Not normally on oxygen immediately started on 2 L which brought her oxygen levels up some.  But she is continued to breathe fast.  The patient was started on BiPAP to rest her.  Even after being on the BiPAP continued to be tachycardic and respiratory rate was up but patient felt better on the BiPAP,  Pt was not intubated at [facility] Received 30cc/kg IVF Vancomycin and cefepime empirically and Tamiflu x 1 dose.   Upon Arrival to [facility] Patient was transferred to [facility] 0023 by [facility] on Bipap Seen by RRT and Hsopitalist Dr Blaine Hamper upon arrival Cold clammy somewhat altered RR >45bpm on BiPAP - RT increased pt's settings to FiO2 100% Rectal temp 103 deg F PCCM consulted for Acute Hypoxic Resp failure.   Past  Medical History  .Marland Kitchen Active Ambulatory Problems    Diagnosis Date Noted  . Non Hodgkin's lymphoma ([place])   . Hyperlipidemia   . SOB (shortness of breath)   . Hyperthyroidism   . Chronic diastolic CHF (congestive heart failure) ([place])   . Transaminitis   . LBBB (left bundle branch block) 12/02/2012   Resolved Ambulatory Problems    Diagnosis Date Noted  . No Resolved Ambulatory Problems   Past Medical History:  Diagnosis Date  . Diastolic heart failure   . Hypertension   . Hypothyroidism   . Left bundle branch block   . S/P right and left heart catheterization May 2012    Significant Hospital Events   Arrived to 2W04 diaphoretic tachypneic ( RR >45) on Bipap BP 87/88mmHg MAP 50s Called by RRT to evaluate patient with acute hypoxia on NIPPV   Consults:  Initially seen by [facility] PCCm consulted w/in <20 mins post transfer  Procedures:  Endotracheal intubation- 07/16/2018 CVC insertion - 07/16/2018 Arterial Line insertion - 07/16/2018  Significant Diagnostic Tests:  ABG: 7.21/42/50/16- not temp corrected NA + 135 Bicarb 19  CXR: Dense consolidation in the left mid and lower lung with air bronchograms likely representing pneumonia.   Micro Data: ( pending)  Blood cx x 2--> Specimen Collected: 07/28/2018 19:10 resp cx--> pending RVP--> pending Influenza swab--> pending  Antimicrobials:  Vancomycin: 07/21/2018 Cefepime:  07/24/2018  Objective   Blood pressure (!) 110/58, pulse (!) 141, temperature 97.7 F (36.5 C), temperature source Oral, resp. rate (!) 41, height 5\' 2"  (1.575 m), weight 73.5 kg, SpO2 100 %.    FiO2 (%):  [50 %] 50 %   Intake/Output Summary (Last 24 hours) at 07/16/2018 0049 Last data filed at 07/23/2018 2111 Gross per 24 hour  Intake 181.92 ml  Output -  Net 181.92 ml   Filed Weights   07/20/2018 1841  Weight: 73.5 kg    Examination: General: slightly altered but following some commands in distress HENT: normocephalic atraumatic NIPPV mask on  face Lungs: coarse breaths sounds decreased on L>R Cardiovascular: S1 & S2 appreciated increased rate no murmur no rub  Abdomen: soft obese non-tender + BS Extremities: no edema noted no signs of atrophy Neuro: no focal deficits appreciated, decreased mental status GU: indwelling foley catheter   Assessment & Plan:  1. Acute Hypoxic Respiratory Failure: upon my evaluation this patient is not a candidate for NIPPV. She appeared altered, cold and clammy, tachypneic despite increased pressure support. Decision made to intubate. Acute Resp Distress PF ratio 50 ( PO2 on gas 50 on FiO2 100%)  Plan: Continue on Mechanical Ventilation with ARDS protocol High PEEP TV 6cc/kg Right lung down RVP and resp cx Goal PO2 >60  Ph >7.3  2. Multilobar Pneumonia:  pt has a h/o NHL( in remission per son) unable to find documentation on care Everywhere. Immunocompromised ( h/o Depo medrol on med list) Plan: Continue on Vancomycin and cefepime emprically F/u blood cx x 2 RVP and resp cx and influenza swab ( pt had positive flu at outpt office on 2/6 will continue Tamiflu at this time) Strep Ag and Legionella sent Droplet precaution for now  3. Septic Shock  Last ECHO was in 2017 EF 50-55% G1 DD Plan: Mixed venous from CVC placed in Left IJ Will obtain a CVP MAP goal >3mmHg F/u cortisol (pt has a h/o depo-medrol use at home) Will start pt on stress dose steroids given h/o Levophed gtt - titrate to MAP goal  Both RT and PA were unsuccessful at A-line ( vasculature clamped down)  4. Acute Encephalopathy: AMS secondary to severe hypoxia Now intubated Plan: RASS goal 0 to -1 Continue on fentanyl gtt with prn versed  5. Anion Gap Metabolic Acidosis - LA < 2 Plan: Monitor UOP with indwelling foley IVF resuscitation Will repeat BMET If ph < 7.2 may consider Bicarb gtt  6. Acute Kidney Injury Most likely from hypoperfusion Plan: Monitor UOP Trend Creatinine Assess volume status with CVP  and IVC measurement Avoid nephrotoxic meds- renally dose meds  7 Postmenopausal Per home meds on Estrogen therapy  Would not continue at this time as pt is on strict bedrest and estrogen can be procoagulant Will hold estrogen Rx  at this time  VTE ppx with Heparin subcu and SCDs  Best practice:  Diet: NPO Pain/Anxiety/Delirium protocol (if indicated): continuous fentanyl with prn versed VAP protocol (if indicated): yes DVT prophylaxis: Hep [place] and SCDs GI prophylaxis:  Pepcid IV Glucose control: if BG exceeds 180mg /dl will start on ISS Mobility: continuous bedrest Code Status: FULL Family Communication: son was at bedside at time of evaluation at [facility]. I discussed with him about her clinical condition, risk of decompensation, possible treatments and procedures that she will need during her Rx.  Disposition: ICU  Labs   CBC: Recent Labs  Lab 08/04/2018 1912  WBC 8.5  NEUTROABS 7.2  HGB 13.8  HCT 45.5  MCV 84.7  PLT 284    Basic Metabolic Panel: Recent Labs  Lab 07/25/2018 1912  NA 135  K 4.6  CL 100  CO2 19*  GLUCOSE 114*  BUN 29*  CREATININE 1.52*  CALCIUM 9.6   GFR: Estimated Creatinine Clearance: 32.3 mL/min (A) (by C-G formula based on SCr of 1.52 mg/dL (H)). Recent Labs  Lab 07/23/2018 1912  WBC 8.5  LATICACIDVEN 1.6    Liver Function Tests: Recent Labs  Lab 08/04/2018 1912  AST 64*  ALT 61*  ALKPHOS 158*  BILITOT 1.7*  PROT 7.2  ALBUMIN 3.5   No results for input(s): LIPASE, AMYLASE in the last 168 hours. No results for input(s): AMMONIA in the last 168 hours.  ABG    Component Value Date/Time   PHART 7.210 (L) 07/16/2018 0030   PCO2ART 42.6 07/16/2018 0030   PO2ART 50.9 (L) 07/16/2018 0030   HCO3 16.4 (L) 07/16/2018 0030   TCO2 24 10/17/2010 1811   TCO2 26 10/17/2010 1811   ACIDBASEDEF 10.0 (H) 07/16/2018 0030   O2SAT 73.6 07/16/2018 0030     Coagulation Profile: Recent Labs  Lab 08/02/2018 1912  INR 1.08    Cardiac Enzymes: No  results for input(s): CKTOTAL, CKMB, CKMBINDEX, TROPONINI in the last 168 hours.  HbA1C: No results found for: HGBA1C  CBG: No results for input(s): GLUCAP in the last 168 hours.  Review of Systems:   Marland KitchenMarland KitchenReview of Systems  Unable to perform ROS: Mental status change   Past Medical History  She,  has a past medical history of Chronic diastolic CHF (congestive heart failure) ([place]), Diastolic heart failure, Hyperlipidemia, Hypertension, Hypothyroidism, Left bundle branch block, Non Hodgkin's lymphoma ([place]), Non Hodgkin's lymphoma ([place]), S/P right and left heart catheterization (May 2012), and Transaminitis.   Surgical History    Past Surgical History:  Procedure Laterality Date  . ABDOMINAL HYSTERECTOMY  1983  . CARDIAC CATHETERIZATION  10/17/2010    Left ventriculography shows mild global left ventricular hypokinesisThe LVEF is estimated at 50%.  Marland Kitchen FOOT FRACTURE SURGERY       Social History   reports that she has never smoked. She has never used smokeless tobacco. She reports that she does not drink alcohol or use drugs.   Family History   Her family history includes Heart attack (age of onset: 62) in her father; Heart disease in her father; Hypertension in her father and mother.   Allergies Allergies  Allergen Reactions  . Metoprolol     fatigue     Home Medications  Prior to Admission medications   Medication Sig Start Date End Date Taking? Authorizing Provider  carvedilol (COREG) 6.25 MG tablet TAKE 1 TABLET BY MOUTH 2 TIMES DAILY WITH A MEAL. 12/11/16   Nahser, Wonda Cheng, MD  cephALEXin (KEFLEX) 500 MG capsule Take 1 capsule (500 mg total) by mouth 2 (two) times daily. 06/18/18   Evelina Bucy, DPM  Cholecalciferol (VITAMIN D3) 2000 UNITS TABS Take 1 tablet by mouth daily.     [provider]  estradiol (ESTRACE) 0.5 MG tablet TAKE 1 TABLET BY MOUTH DAILY FOR THREE WEEKS, 1 WEEK OFF 05/07/18   [provider]  estrogens conjugated, synthetic A, (CENESTIN)  0.625 MG tablet Take 0.625 mg by mouth daily.      [provider]  Fe Fum-FePoly-Vit C-Vit B3 (INTEGRA) 62.5-62.5-40-3 MG CAPS Take 1 tablet by mouth daily. 06/18/18   [provider]  fluticasone (FLONASE) 50 MCG/ACT  nasal spray Place 1 spray into both nostrils as needed for allergies or rhinitis.    [provider]  furosemide (LASIX) 20 MG tablet Take 1 tablet (20 mg total) by mouth daily. 04/12/18 04/07/19  Nahser, Wonda Cheng, MD  HYDROcodone-acetaminophen (NORCO) 10-325 MG tablet Take 1 tablet by mouth every 4 (four) hours as needed. 06/18/18   Evelina Bucy, DPM  levothyroxine (SYNTHROID, LEVOTHROID) 75 MCG tablet Take 75 mcg by mouth daily. 10/20/14   [provider]  lisinopril (PRINIVIL,ZESTRIL) 2.5 MG tablet TAKE 1 TABLET (2.5 MG TOTAL) BY MOUTH DAILY. 09/18/16   Nahser, Wonda Cheng, MD  lovastatin (MEVACOR) 40 MG tablet Take 40 mg by mouth daily. 12/06/14   [provider]  omeprazole (PRILOSEC OTC) 20 MG tablet Take 20 mg by mouth daily.      [provider]  omeprazole (PRILOSEC) 20 MG capsule  06/24/18   [provider]  potassium chloride SA (K-DUR,KLOR-CON) 20 MEQ tablet Take 1 tablet (20 mEq total) by mouth daily. 04/12/18   Nahser, Wonda Cheng, MD     Critical care time: 65 mins    STAFF NOTE  I, Dr Seward Carol have personally reviewed patient's available data, including medical history, events of note, physical examination and test results as part of my evaluation. I have discussed with PA  and other care providers such as pharmacist, RRT, RN and Elink.  The patient is critically ill with multiple organ systems failure and requires high complexity decision making for assessment and support, frequent evaluation and titration of therapies, application of advanced monitoring technologies and extensive interpretation of multiple databases.   Critical Care Time devoted to patient care services described in this note is   Minutes. This time reflects time of care of this signee Dr Seward Carol. This critical care time does not reflect procedure time, or teaching time or supervisory time of NP but could involve care discussion time   CC TIME:65 minutes CODE STATUS: FULL DISPOSITION: ICU PROGNOSIS: Guarded  Dr. Seward Carol Pulmonary Critical Care Medicine  07/16/2018 2:30 AM

## 2018-07-16 NOTE — Procedures (Addendum)
Thoracentesis Procedure Note  Pre-operative Diagnosis: resp failure  Post-operative Diagnosis: same  Indications: Resp failure left lung whiteout  Above left lung window 6 th rib space     Procedure Details  Consent: Informed consent was obtained. Risks of the procedure were discussed including: infection, bleeding, pain, pneumothorax.  Under sterile conditions the patient was positioned. Betadine solution and sterile drapes were utilized.  1% buffered lidocaine was used to anesthetize the 6th rib space. Fluid was obtained without any difficulties and minimal blood loss.  A dressing was applied to the wound and wound care instructions were provided.   Findings 650 ml of blood tinged  pleural fluid was obtained. A sample was sent to Pathology for cytogenetics, flow, and cell counts, as well as for infection analysis.  Complications:  None; patient tolerated the procedure well.          Condition: stable  Plan A follow up chest x-ray was ordered. Bed Rest for 0 hours. Tylenol 650 mg. for pain.  Richardson Landry Minor ACNP Maryanna Shape PCCM Pager [phone] till 1 pm If no answer page [phone] 07/16/2018, 11:41 AM

## 2018-07-16 NOTE — Procedures (Signed)
Intubation Procedure Note Kristin Moon [id number] May 24, 1946  Procedure: Intubation Indications: Respiratory insufficiency  Procedure Details Consent: Unable to obtain consent because of emergent medical necessity. Time Out: Verified patient identification, verified procedure, site/side was marked, verified correct patient position, special equipment/implants available, medications/allergies/relevent history reviewed, required imaging and test results available.  Performed  Medications: fentnayl 36m, Versed 287mand Propofol 4071maximum sterile technique was used including antiseptics, gloves, hand hygiene and mask.  MAC and 3 22 at lip   Evaluation Hemodynamic Status: BP stable throughout; O2 sats: stable throughout Patient's Current Condition: stable Complications: No apparent complications Patient did tolerate procedure well. Chest X-ray ordered to verify placement.  CXR: pending.   Kristin Moon 07/16/2018

## 2018-07-16 NOTE — Progress Notes (Signed)
RN received report from [facility]. In report RN was told pt was on Bipap respirations in the 40s and sinus tachycardia. RN alerted Rapid Response RN prior to pt arrival due to pt's condition. Pt arrived on unit 0027 via [place] on Bipap. Pt respirations still in the 40s, HR in the 140s. Rectal temperature 103.2 and EKG performed. Dr. Blaine Hamper paged and told to come to the bedside to immediately to assess pt status. Critical Care then consulted. Decision was made to transfer pt to 23M ICU.

## 2018-07-16 NOTE — Progress Notes (Signed)
..   NAME:  Kristin Moon, MRN:  [id number], DOB:  1945/07/28, LOS: 1 ADMISSION DATE:  2018/08/02, CONSULTATION DATE:  07/16/2018 REFERRING MD:  [facility] Hospitalist, CHIEF COMPLAINT:  SOB and fever   Brief History    73 yr old female w/ PMHx of NHL, HLD, HTN, Hypothyroidism, LBBB, CAD G1DD presented from home via EMS after coughing up blood and feeling weak, Tested flu positive at her PMD visit on 07/14/2018 and started on Tamiflu as outpatient but continued to worsen Failed BiPAP treatment, intubated for acute respiratory failure.  Past Medical History  NHL (per son last radiation Rx was 30 yrs ago), HLD, HTN, Hypothyroidism, LBBB, CAD, G1DD (EF 50%) w/ a history of pneumonia last summer/fall not requiring hospitalization.   Significant Hospital Events   2/7 Admit with flu infection, intubated  Consults:  PCCM  Procedures:  Endotracheal intubation- 07/16/2018 CVC insertion - 07/16/2018 Arterial Line insertion - 07/16/2018  Significant Diagnostic Tests:  Chest x-ray 2/7-dense consolidation in the left lung with possible effusion.  I have reviewed the images personally.  Micro Data: ( pending)  Blood cx x 2--> Specimen Collected: Aug 02, 2018 19:10 resp cx--> pending RVP--> Flu B Influenza swab--> Flu B U pneumococcus 2/7 > positive  Antimicrobials:  Vancomycin: 2/6 > 2/7 Cefepime: 2/6 >> Tamiflu 2/5 >>  Objective   Blood pressure (!) 138/45, pulse 95, temperature 99.2 F (37.3 C), temperature source Oral, resp. rate 20, height 5\' 5"  (1.651 m), weight 74 kg, SpO2 100 %. CVP:  [12 mmHg] 12 mmHg  Vent Mode: PRVC FiO2 (%):  [50 %-100 %] 70 % Set Rate:  [22 bmp] 22 bmp Vt Set:  [350 mL] 350 mL PEEP:  [10 cmH20] 10 cmH20 Plateau Pressure:  [17 cmH20-19 cmH20] 17 cmH20   Intake/Output Summary (Last 24 hours) at 07/16/2018 0957 Last data filed at 07/16/2018 0800 Gross per 24 hour  Intake 1055.33 ml  Output 210 ml  Net 845.33 ml   Filed Weights   Aug 02, 2018 1841 07/16/18 0500  Weight: 73.5  kg 74 kg   Examination: Gen:      No acute distress HEENT:  EOMI, sclera anicteric Neck:     No masses; no thyromegaly, ETT Lungs:    Clear to auscultation bilaterally; normal respiratory effort CV:         Regular rate and rhythm; no murmurs Abd:      + bowel sounds; soft, non-tender; no palpable masses, no distension Ext:    No edema; adequate peripheral perfusion Skin:      Warm and dry; no rash Neuro: sedated, unresponsive  Assessment & Plan:  73 year old with acute respiratory failure in the setting of flu B infection, Streptococcus pneumonia  Respiratory failure Flu, Streptococcus pneumonia Continue vent support Follow chest x-ray, ABG We will check bedside ultrasound to eval for effusion Will need thoracentesis if significant fluid present. Plan for bronchoscope.   Septic shock Continue stress dose steroids, Levophed Start vasopressin Defer A-line for now as blood pressure has remained stable.  Multiple attempts overnight for A-line were unsuccessful Follow CVP  Anion gap metabolic acidosis, AKI Monitor urine output and creatinine.  Best practice:  Diet: NPO Pain/Anxiety/Delirium protocol (if indicated): continuous fentanyl with prn versed VAP protocol (if indicated): yes DVT prophylaxis: Hep [place] and SCDs GI prophylaxis:  Pepcid IV Glucose control: if BG exceeds 180mg /dl will start on ISS Mobility: continuous bedrest Code Status: FULL Family Communication: Son and daughter updated at bedside 2/7 Disposition: ICU  Labs   CBC:  Recent Labs  Lab 2018-08-09 1912 07/16/18 0224  WBC 8.5 8.5  NEUTROABS 7.2 7.1  HGB 13.8 7.7*  HCT 45.5 25.9*  MCV 84.7 87.8  PLT 204 124*    Basic Metabolic Panel: Recent Labs  Lab August 09, 2018 1912 07/16/18 0224 07/16/18 0552  NA 135 143 139  K 4.6 2.9* 4.9  CL 100 126* 113*  CO2 19* 12* 17*  GLUCOSE 114* 85 138*  BUN 29* 20 29*  CREATININE 1.52* 0.80 1.20*  CALCIUM 9.6 4.6* 7.6*  MG  --  1.0* 1.4*  PHOS  --  2.8  --     GFR: Estimated Creatinine Clearance: 43.9 mL/min (A) (by C-G formula based on SCr of 1.2 mg/dL (H)). Recent Labs  Lab 2018/08/09 1912 07/16/18 0224 07/16/18 0348  WBC 8.5 8.5  --   LATICACIDVEN 1.6 2.5* 2.3*    Liver Function Tests: Recent Labs  Lab Aug 09, 2018 1912 07/16/18 0224  AST 64* 22  ALT 61* 26  ALKPHOS 158* 53  BILITOT 1.7* 0.7  PROT 7.2 <3.0*  ALBUMIN 3.5 1.3*   No results for input(s): LIPASE, AMYLASE in the last 168 hours. No results for input(s): AMMONIA in the last 168 hours.  ABG    Component Value Date/Time   PHART 7.238 (L) 07/16/2018 0255   PCO2ART 40.0 07/16/2018 0255   PO2ART 259 (H) 07/16/2018 0255   HCO3 15.9 (L) 07/16/2018 0255   TCO2 24 10/17/2010 1811   TCO2 26 10/17/2010 1811   ACIDBASEDEF 9.7 (H) 07/16/2018 0255   O2SAT 99.1 07/16/2018 0255     Coagulation Profile: Recent Labs  Lab 08-09-18 1912  INR 1.08    Cardiac Enzymes: No results for input(s): CKTOTAL, CKMB, CKMBINDEX, TROPONINI in the last 168 hours.  HbA1C: No results found for: HGBA1C  CBG: Recent Labs  Lab 07/16/18 0406  GLUCAP 92   The patient is critically ill with multiple organ system failure and requires high complexity decision making for assessment and support, frequent evaluation and titration of therapies, advanced monitoring, review of radiographic studies and interpretation of complex data.   Critical Care Time devoted to patient care services, exclusive of separately billable procedures, described in this note is 35 minutes.   Chilton Greathouse MD [facility] Pager [phone] If no answer call [phone] 07/16/2018, 9:58 AM

## 2018-07-16 NOTE — Significant Event (Addendum)
Rapid Response Event Note  Overview: Respiratory Distress  Initial Focused Assessment: I was informed by the charge RN that the patient was coming from [facility] ED. Per nurse, patient was already on BIPAP, still tachycardiac and tachypneic. Charge RN was concerned that patient might need to be sent to the ICU.  Patient arrived at [place], I arrived as soon as the patient did. Upon arrival, patient was still in respiratory distress, RR was in the 40-50, HR 145-150, SBP in the 80s, 103 (rectal temp), patient was diaphoretic/clammy/cold to touch, mottling in BLE. Lung sounds - rhonchi throughout, minimal air movement on the left. BIPAP 12/6 100% and saturations were 98%. + AMS, able to answer some questions.TRH MD was already called to bedside. I contacted PCCM MD as well, PCCM MD came to bedside as well.  Interventions: -- STAT ABG 7.21/42.6/50.9/16  Plan of Care: -- Transferred to 3M13 for urgent intubation. Patient was intubate upon arrival to 3M11. I placed a 22G PIV in the RUA.   Event Summary:    at    Call Time 2327 Arrival Time 0014 End Time 0200  Brennan Litzinger R

## 2018-07-16 NOTE — Progress Notes (Signed)
Repeated BMET. MD had placed an order for K replacement but K 4.9.   [facility] MD called and verbal order to DC K.

## 2018-07-16 NOTE — Progress Notes (Signed)
eLink Physician-Brief Progress Note Patient Name: Kristin Moon DOB: 10-26-1945 MRN: [id number]   Date of Service  07/16/2018  HPI/Events of Note  Mg++, Phosphorus and K+ levels are markedly deviated from pt's baseline raising the possibility of lab error  eICU Interventions  Will redraw Mg++, K+, PO4 to verify prior to initiating Rx.        Kerry Kass Deloyce Walthers 07/16/2018, 5:51 AM

## 2018-07-16 NOTE — Progress Notes (Signed)
CRITICAL VALUE ALERT  Critical Value:  K 2.9  Date & Time Notied:  07/16/18 @ 0500  Provider Notified: Warren Lacy   Orders Received/Actions taken: waiting on orders

## 2018-07-17 ENCOUNTER — Inpatient Hospital Stay (HOSPITAL_COMMUNITY): Payer: Medicare Other

## 2018-07-17 LAB — CBC WITH DIFFERENTIAL/PLATELET
Abs Immature Granulocytes: 0.13 10*3/uL — ABNORMAL HIGH (ref 0.00–0.07)
Abs Immature Granulocytes: 0.08 10*3/uL — ABNORMAL HIGH (ref 0.00–0.07)
Basophils Absolute: 0 10*3/uL (ref 0.0–0.1)
Basophils Absolute: 0 10*3/uL (ref 0.0–0.1)
Basophils Relative: 0 %
Basophils Relative: 0 %
EOS ABS: 0 10*3/uL (ref 0.0–0.5)
Eosinophils Absolute: 0 10*3/uL (ref 0.0–0.5)
Eosinophils Relative: 0 %
Eosinophils Relative: 0 %
HCT: 35 % — ABNORMAL LOW (ref 36.0–46.0)
HCT: 30.3 % — ABNORMAL LOW (ref 36.0–46.0)
Hemoglobin: 10.3 g/dL — ABNORMAL LOW (ref 12.0–15.0)
Hemoglobin: 8.7 g/dL — ABNORMAL LOW (ref 12.0–15.0)
Immature Granulocytes: 1 %
Immature Granulocytes: 1 %
Lymphocytes Relative: 5 %
Lymphocytes Relative: 5 %
Lymphs Abs: 0.8 10*3/uL (ref 0.7–4.0)
Lymphs Abs: 0.5 10*3/uL — ABNORMAL LOW (ref 0.7–4.0)
MCH: 25.7 pg — ABNORMAL LOW (ref 26.0–34.0)
MCH: 25.4 pg — ABNORMAL LOW (ref 26.0–34.0)
MCHC: 29.4 g/dL — ABNORMAL LOW (ref 30.0–36.0)
MCHC: 28.7 g/dL — ABNORMAL LOW (ref 30.0–36.0)
MCV: 87.3 fL (ref 80.0–100.0)
MCV: 88.6 fL (ref 80.0–100.0)
Monocytes Absolute: 0.8 10*3/uL (ref 0.1–1.0)
Monocytes Absolute: 0 10*3/uL — ABNORMAL LOW (ref 0.1–1.0)
Monocytes Relative: 5 %
Monocytes Relative: 0 %
NEUTROS ABS: 9.7 10*3/uL — AB (ref 1.7–7.7)
Neutro Abs: 14.5 10*3/uL — ABNORMAL HIGH (ref 1.7–7.7)
Neutrophils Relative %: 89 %
Neutrophils Relative %: 95 %
PLATELETS: 125 10*3/uL — AB (ref 150–400)
Platelets: 200 10*3/uL (ref 150–400)
RBC: 4.01 MIL/uL (ref 3.87–5.11)
RBC: 3.42 MIL/uL — AB (ref 3.87–5.11)
RDW: 19.7 % — AB (ref 11.5–15.5)
RDW: 19.9 % — AB (ref 11.5–15.5)
WBC: 16.2 10*3/uL — ABNORMAL HIGH (ref 4.0–10.5)
WBC: 10.2 10*3/uL (ref 4.0–10.5)
nRBC: 0 % (ref 0.0–0.2)
nRBC: 0 % (ref 0.0–0.2)

## 2018-07-17 LAB — GLUCOSE, CAPILLARY
GLUCOSE-CAPILLARY: 223 mg/dL — AB (ref 70–99)
Glucose-Capillary: 173 mg/dL — ABNORMAL HIGH (ref 70–99)
Glucose-Capillary: 174 mg/dL — ABNORMAL HIGH (ref 70–99)
Glucose-Capillary: 189 mg/dL — ABNORMAL HIGH (ref 70–99)
Glucose-Capillary: 198 mg/dL — ABNORMAL HIGH (ref 70–99)
Glucose-Capillary: 255 mg/dL — ABNORMAL HIGH (ref 70–99)

## 2018-07-17 LAB — BASIC METABOLIC PANEL
Anion gap: 7 (ref 5–15)
BUN: 31 mg/dL — AB (ref 8–23)
CO2: 19 mmol/L — ABNORMAL LOW (ref 22–32)
CREATININE: 1.07 mg/dL — AB (ref 0.44–1.00)
Calcium: 8.3 mg/dL — ABNORMAL LOW (ref 8.9–10.3)
Chloride: 115 mmol/L — ABNORMAL HIGH (ref 98–111)
GFR calc Af Amer: >60 mL/min (ref >60)
GFR calc non Af Amer: 53 mL/min — ABNORMAL LOW (ref >60)
Glucose, Bld: 217 mg/dL — ABNORMAL HIGH (ref 70–99)
Potassium: 4.5 mmol/L (ref 3.5–5.1)
Sodium: 141 mmol/L (ref 135–145)

## 2018-07-17 LAB — PHOSPHORUS: PHOSPHORUS: 1.6 mg/dL — AB (ref 2.5–4.6)

## 2018-07-17 LAB — CALCIUM, IONIZED: Calcium, Ionized, Serum: 4.2 mg/dL — ABNORMAL LOW (ref 4.5–5.6)

## 2018-07-17 LAB — LACTIC ACID, PLASMA: Lactic Acid, Venous: 1.9 mmol/L (ref 0.5–1.9)

## 2018-07-17 LAB — MAGNESIUM: Magnesium: 2.4 mg/dL (ref 1.7–2.4)

## 2018-07-17 MED ORDER — IBUPROFEN 100 MG/5ML PO SUSP
600.0000 mg | Freq: Once | ORAL | Status: AC
  Administered 2018-07-17: 600 mg
  Filled 2018-07-17: qty 30

## 2018-07-17 MED ORDER — ACETAMINOPHEN 160 MG/5ML PO SOLN
650.0000 mg | ORAL | Status: DC | PRN
  Administered 2018-07-17 – 2018-07-20 (×8): 650 mg via ORAL
  Filled 2018-07-17 (×8): qty 20.3

## 2018-07-17 MED ORDER — INSULIN ASPART 100 UNIT/ML ~~LOC~~ SOLN
0.0000 [IU] | SUBCUTANEOUS | Status: DC
  Administered 2018-07-17: 3 [IU] via SUBCUTANEOUS
  Administered 2018-07-17: 5 [IU] via SUBCUTANEOUS
  Administered 2018-07-17: 8 [IU] via SUBCUTANEOUS
  Administered 2018-07-17 – 2018-07-18 (×2): 3 [IU] via SUBCUTANEOUS
  Administered 2018-07-18 (×3): 5 [IU] via SUBCUTANEOUS

## 2018-07-17 MED ORDER — LEVOTHYROXINE SODIUM 100 MCG/5ML IV SOLN
37.5000 ug | Freq: Every day | INTRAVENOUS | Status: DC
  Administered 2018-07-17 – 2018-07-26 (×10): 37.5 ug via INTRAVENOUS
  Filled 2018-07-17 (×10): qty 5

## 2018-07-17 NOTE — Progress Notes (Addendum)
..   NAME:  Kristin Moon, MRN:  [id number], DOB:  [birth year], LOS: 2 ADMISSION DATE:  07/25/2018, CONSULTATION DATE:  07/16/2018 REFERRING MD:  [facility] Hospitalist, CHIEF COMPLAINT:  SOB and fever   Brief History    73 yr old female w/ PMHx of NHL, HLD, HTN, Hypothyroidism, LBBB, CAD G1DD presented from home via EMS after coughing up blood and feeling weak, Tested flu positive at her PMD visit on 07/14/2018 and started on Tamiflu as outpatient but continued to worsen Failed BiPAP treatment, intubated for acute respiratory failure.  Past Medical History  NHL (per son last radiation Rx was 103 yrs ago), HLD, HTN, Hypothyroidism, LBBB, CAD, G1DD (EF 50%) w/ a history of pneumonia last summer/fall not requiring hospitalization.   Significant Hospital Events   2/7 Admit with flu infection, pneumococcus PNA, intubated  Consults:  PCCM  Procedures:  Endotracheal intubation- 07/16/2018 >> CVC insertion - 07/16/2018  Thoracentesis 2/7 Bronch, BAL 2/7  Significant Diagnostic Tests:  Chest x-ray 2/8-dense consolidation in the left lung with possible effusion.  I have reviewed the images personally.  Micro Data: ( pending)  Blood cx x 2--> Specimen Collected: 07/17/2018 19:10 resp cx--> pending RVP--> Flu B Influenza swab--> Flu B U pneumococcus 2/7 > positive  Antimicrobials:  Vancomycin: 2/6 > 2/7 Cefepime: 2/6 >> Tamiflu 2/5 >>  Subjective Continues on the vent, Levophed and vasopressin No acute events overnight.  Objective   Blood pressure (!) 142/39, pulse 98, temperature 98.8 F (37.1 C), temperature source Oral, resp. rate (!) 22, height _0  (1.651 m), weight 75 kg, SpO2 100 %. CVP:  [9 mmHg-15 mmHg] 10 mmHg  Vent Mode: PRVC FiO2 (%):  [40 %-60 %] 40 % Set Rate:  [22 bmp] 22 bmp Vt Set:  [350 mL] 350 mL PEEP:  [10 cmH20] 10 cmH20 Plateau Pressure:  [13 cmH20-22 cmH20] 22 cmH20   Intake/Output Summary (Last 24 hours) at 07/17/2018 0810 Last data filed at 07/17/2018 0500 Gross per  24 hour  Intake 1942.44 ml  Output 600 ml  Net 1342.44 ml   Filed Weights   07/26/2018 1841 07/16/18 0500 07/17/18 0500  Weight: 73.5 kg 74 kg 75 kg   Examination: Gen:      No acute distress HEENT:  EOMI, sclera anicteric ET tube Neck:     No masses; no thyromegaly Lungs:    Clear to auscultation bilaterally; normal respiratory effort CV:         Regular rate and rhythm; no murmurs Abd:      + bowel sounds; soft, non-tender; no palpable masses, no distension Ext:    No edema; adequate peripheral perfusion Skin:      Warm and dry; no rash Neuro: Sedated, unresponsive.  Assessment & Plan:  73 year old with acute respiratory failure in the setting of flu B infection, Streptococcus pneumonia  Respiratory failure Flu, Streptococcus pneumonia Continue vent support Change to low tidal volume ventilation Follow chest x-ray, ABG.   Septic shock Continue stress dose steroids Wean off pressors as tolerated  Anion gap metabolic acidosis, AKI Monitor urine output and creatinine.  Hypothyroidism, elevated blood sugar Start Synthroid SSI coverage.  Best practice:  Diet: Tube feeds Pain/Anxiety/Delirium protocol (if indicated): continuous fentanyl with prn versed VAP protocol (if indicated): yes DVT prophylaxis: Hep Ensign and SCDs GI prophylaxis:  Pepcid IV Glucose control: if BG exceeds 19m/dl will start on ISS Mobility: continuous bedrest Code Status: FULL Family Communication: Son and daughter updated at bedside 2/7 Disposition: ICU  Labs  CBC: Recent Labs  Lab 07/30/2018 1912 07/16/18 0224 07/16/18 0822 07/16/18 1834 07/17/18 0515  WBC 8.5 8.5 19.5* 19.4* PENDING  NEUTROABS 7.2 7.1 18.5* 17.7* PENDING  HGB 13.8 7.7* 10.7* 10.9* 10.3*  HCT 45.5 25.9* 36.7 36.9 35.0*  MCV 84.7 87.8 87.2 86.8 87.3  PLT 204 124* 205 204 469    Basic Metabolic Panel: Recent Labs  Lab 07/31/2018 1912 07/16/18 0224 07/16/18 0552 07/17/18 0515  NA 135 143 139 141  K 4.6 2.9* 4.9  4.5  CL 100 126* 113* 115*  CO2 19* 12* 17* 19*  GLUCOSE 114* 85 138* 217*  BUN 29* 20 29* 31*  CREATININE 1.52* 0.80 1.20* 1.07*  CALCIUM 9.6 4.6* 7.6* 8.3*  MG  --  1.0* 1.4* 2.4  PHOS  --  2.8  --  1.6*   GFR: Estimated Creatinine Clearance: 49.6 mL/min (A) (by C-G formula based on SCr of 1.07 mg/dL (H)). Recent Labs  Lab 07/27/2018 1912 07/16/18 0224 07/16/18 0348 07/16/18 0822 07/16/18 1834 07/17/18 0515  WBC 8.5 8.5  --  19.5* 19.4* PENDING  LATICACIDVEN 1.6 2.5* 2.3*  --   --   --     Liver Function Tests: Recent Labs  Lab 08/03/2018 1912 07/16/18 0224 07/16/18 1320  AST 64* 22  --   ALT 61* 26  --   ALKPHOS 158* 53  --   BILITOT 1.7* 0.7  --   PROT 7.2 <3.0* 5.4*  ALBUMIN 3.5 1.3*  --    No results for input(s): LIPASE, AMYLASE in the last 168 hours. No results for input(s): AMMONIA in the last 168 hours.  ABG    Component Value Date/Time   PHART 7.267 (L) 07/16/2018 1035   PCO2ART 37.7 07/16/2018 1035   PO2ART 191 (H) 07/16/2018 1035   HCO3 16.6 (L) 07/16/2018 1035   TCO2 24 10/17/2010 1811   TCO2 26 10/17/2010 1811   ACIDBASEDEF 9.0 (H) 07/16/2018 1035   O2SAT 99.0 07/16/2018 1035     Coagulation Profile: Recent Labs  Lab 07/13/2018 1912  INR 1.08    Cardiac Enzymes: No results for input(s): CKTOTAL, CKMB, CKMBINDEX, TROPONINI in the last 168 hours.  HbA1C: No results found for: HGBA1C  CBG: Recent Labs  Lab 07/16/18 0406 07/16/18 1115 07/16/18 2046 07/17/18 0035 07/17/18 0443  GLUCAP 92 115* 157* 173* 198*   The patient is critically ill with multiple organ system failure and requires high complexity decision making for assessment and support, frequent evaluation and titration of therapies, advanced monitoring, review of radiographic studies and interpretation of complex data.   Critical Care Time devoted to patient care services, exclusive of separately billable procedures, described in this note is 35 minutes.   Marshell Garfinkel  MD [place] [facility] Pager [phone] If no answer call 336 [phone] 07/17/2018, 8:22 AM

## 2018-07-18 ENCOUNTER — Inpatient Hospital Stay (HOSPITAL_COMMUNITY): Payer: Medicare Other

## 2018-07-18 LAB — BASIC METABOLIC PANEL
Anion gap: 5 (ref 5–15)
BUN: 35 mg/dL — AB (ref 8–23)
CO2: 21 mmol/L — ABNORMAL LOW (ref 22–32)
Calcium: 8.8 mg/dL — ABNORMAL LOW (ref 8.9–10.3)
Chloride: 120 mmol/L — ABNORMAL HIGH (ref 98–111)
Creatinine, Ser: 0.95 mg/dL (ref 0.44–1.00)
GFR calc Af Amer: >60 mL/min (ref >60)
GFR calc non Af Amer: >60 mL/min (ref >60)
GLUCOSE: 230 mg/dL — AB (ref 70–99)
Potassium: 4.2 mmol/L (ref 3.5–5.1)
Sodium: 146 mmol/L — ABNORMAL HIGH (ref 135–145)

## 2018-07-18 LAB — CBC WITH DIFFERENTIAL/PLATELET
Abs Immature Granulocytes: 0.03 10*3/uL (ref 0.00–0.07)
Abs Immature Granulocytes: 0.1 10*3/uL — ABNORMAL HIGH (ref 0.00–0.07)
Basophils Absolute: 0 10*3/uL (ref 0.0–0.1)
Basophils Absolute: 0 10*3/uL (ref 0.0–0.1)
Basophils Relative: 0 %
Basophils Relative: 0 %
EOS PCT: 0 %
Eosinophils Absolute: 0 10*3/uL (ref 0.0–0.5)
Eosinophils Absolute: 0 10*3/uL (ref 0.0–0.5)
Eosinophils Relative: 0 %
HCT: 29.3 % — ABNORMAL LOW (ref 36.0–46.0)
HCT: 32.2 % — ABNORMAL LOW (ref 36.0–46.0)
Hemoglobin: 8.8 g/dL — ABNORMAL LOW (ref 12.0–15.0)
Hemoglobin: 9.3 g/dL — ABNORMAL LOW (ref 12.0–15.0)
Immature Granulocytes: 0 %
Immature Granulocytes: 1 %
Lymphocytes Relative: 7 %
Lymphocytes Relative: 8 %
Lymphs Abs: 0.6 10*3/uL — ABNORMAL LOW (ref 0.7–4.0)
Lymphs Abs: 0.7 10*3/uL (ref 0.7–4.0)
MCH: 26.7 pg (ref 26.0–34.0)
MCH: 25.5 pg — ABNORMAL LOW (ref 26.0–34.0)
MCHC: 30 g/dL (ref 30.0–36.0)
MCHC: 28.9 g/dL — ABNORMAL LOW (ref 30.0–36.0)
MCV: 89.1 fL (ref 80.0–100.0)
MCV: 88.5 fL (ref 80.0–100.0)
Monocytes Absolute: 0.6 10*3/uL (ref 0.1–1.0)
Monocytes Absolute: 0.6 10*3/uL (ref 0.1–1.0)
Monocytes Relative: 6 %
Monocytes Relative: 7 %
NEUTROS ABS: 8 10*3/uL — AB (ref 1.7–7.7)
NEUTROS PCT: 84 %
NRBC: 0 % (ref 0.0–0.2)
Neutro Abs: 7.5 10*3/uL (ref 1.7–7.7)
Neutrophils Relative %: 87 %
PLATELETS: 138 10*3/uL — AB (ref 150–400)
Platelets: 124 10*3/uL — ABNORMAL LOW (ref 150–400)
RBC: 3.29 MIL/uL — ABNORMAL LOW (ref 3.87–5.11)
RBC: 3.64 MIL/uL — ABNORMAL LOW (ref 3.87–5.11)
RDW: 20.3 % — ABNORMAL HIGH (ref 11.5–15.5)
RDW: 20.7 % — AB (ref 11.5–15.5)
WBC: 8.6 10*3/uL (ref 4.0–10.5)
WBC: 9.4 10*3/uL (ref 4.0–10.5)
nRBC: 0 % (ref 0.0–0.2)

## 2018-07-18 LAB — GLUCOSE, CAPILLARY
Glucose-Capillary: 164 mg/dL — ABNORMAL HIGH (ref 70–99)
Glucose-Capillary: 165 mg/dL — ABNORMAL HIGH (ref 70–99)
Glucose-Capillary: 173 mg/dL — ABNORMAL HIGH (ref 70–99)
Glucose-Capillary: 187 mg/dL — ABNORMAL HIGH (ref 70–99)
Glucose-Capillary: 203 mg/dL — ABNORMAL HIGH (ref 70–99)
Glucose-Capillary: 209 mg/dL — ABNORMAL HIGH (ref 70–99)
Glucose-Capillary: 219 mg/dL — ABNORMAL HIGH (ref 70–99)

## 2018-07-18 LAB — CULTURE, RESPIRATORY W GRAM STAIN: Culture: NORMAL

## 2018-07-18 LAB — MAGNESIUM: Magnesium: 2.5 mg/dL — ABNORMAL HIGH (ref 1.7–2.4)

## 2018-07-18 LAB — LEGIONELLA PNEUMOPHILA SEROGP 1 UR AG: L. pneumophila Serogp 1 Ur Ag: NEGATIVE

## 2018-07-18 LAB — PHOSPHORUS: Phosphorus: 1.1 mg/dL — ABNORMAL LOW (ref 2.5–4.6)

## 2018-07-18 MED ORDER — SODIUM PHOSPHATES 45 MMOLE/15ML IV SOLN
30.0000 mmol | Freq: Once | INTRAVENOUS | Status: AC
  Administered 2018-07-18: 30 mmol via INTRAVENOUS
  Filled 2018-07-18: qty 10

## 2018-07-18 MED ORDER — SODIUM CHLORIDE 0.9% FLUSH: 10.0000 mL | INTRAVENOUS | Status: DC | PRN

## 2018-07-18 MED ORDER — SODIUM CHLORIDE 0.9% FLUSH
10.0000 mL | Freq: Two times a day (BID) | INTRAVENOUS | Status: DC
  Administered 2018-07-18 – 2018-07-23 (×9): 10 mL

## 2018-07-18 MED ORDER — SENNOSIDES 8.8 MG/5ML PO SYRP
5.0000 mL | ORAL_SOLUTION | Freq: Two times a day (BID) | ORAL | Status: DC | PRN
  Administered 2018-07-18 – 2018-07-25 (×6): 5 mL
  Filled 2018-07-18 (×6): qty 5

## 2018-07-18 MED ORDER — CHLORHEXIDINE GLUCONATE CLOTH 2 % EX PADS
6.0000 | MEDICATED_PAD | Freq: Every day | CUTANEOUS | Status: DC
  Administered 2018-07-18 – 2018-07-24 (×7): 6 via TOPICAL

## 2018-07-18 MED ORDER — INSULIN ASPART 100 UNIT/ML ~~LOC~~ SOLN
0.0000 [IU] | SUBCUTANEOUS | Status: DC
  Administered 2018-07-18 – 2018-07-19 (×5): 4 [IU] via SUBCUTANEOUS
  Administered 2018-07-19: 3 [IU] via SUBCUTANEOUS
  Administered 2018-07-19: 11 [IU] via SUBCUTANEOUS
  Administered 2018-07-19 – 2018-07-20 (×2): 4 [IU] via SUBCUTANEOUS
  Administered 2018-07-20: 7 [IU] via SUBCUTANEOUS
  Administered 2018-07-20: 4 [IU] via SUBCUTANEOUS
  Administered 2018-07-20: 7 [IU] via SUBCUTANEOUS
  Administered 2018-07-20 – 2018-07-21 (×3): 4 [IU] via SUBCUTANEOUS
  Administered 2018-07-21: 11 [IU] via SUBCUTANEOUS
  Administered 2018-07-21: 7 [IU] via SUBCUTANEOUS
  Administered 2018-07-21: 4 [IU] via SUBCUTANEOUS
  Administered 2018-07-21: 7 [IU] via SUBCUTANEOUS
  Administered 2018-07-21: 4 [IU] via SUBCUTANEOUS
  Administered 2018-07-22 (×3): 7 [IU] via SUBCUTANEOUS
  Administered 2018-07-22: 4 [IU] via SUBCUTANEOUS
  Administered 2018-07-22 (×2): 7 [IU] via SUBCUTANEOUS
  Administered 2018-07-22: 11 [IU] via SUBCUTANEOUS
  Administered 2018-07-23: 4 [IU] via SUBCUTANEOUS
  Administered 2018-07-23: 3 [IU] via SUBCUTANEOUS
  Administered 2018-07-23: 4 [IU] via SUBCUTANEOUS
  Administered 2018-07-23: 3 [IU] via SUBCUTANEOUS
  Administered 2018-07-24: 4 [IU] via SUBCUTANEOUS
  Administered 2018-07-24: 7 [IU] via SUBCUTANEOUS
  Administered 2018-07-24: 3 [IU] via SUBCUTANEOUS
  Administered 2018-07-24 (×2): 4 [IU] via SUBCUTANEOUS
  Administered 2018-07-25 (×2): 7 [IU] via SUBCUTANEOUS
  Administered 2018-07-25 – 2018-07-26 (×5): 4 [IU] via SUBCUTANEOUS
  Administered 2018-07-26 (×3): 7 [IU] via SUBCUTANEOUS
  Administered 2018-07-27 (×2): 4 [IU] via SUBCUTANEOUS
  Administered 2018-07-27: 11 [IU] via SUBCUTANEOUS
  Administered 2018-07-27 (×3): 7 [IU] via SUBCUTANEOUS
  Administered 2018-07-28 (×2): 4 [IU] via SUBCUTANEOUS
  Administered 2018-07-28: 3 [IU] via SUBCUTANEOUS
  Administered 2018-07-28 (×2): 4 [IU] via SUBCUTANEOUS
  Administered 2018-07-28: 7 [IU] via SUBCUTANEOUS
  Administered 2018-07-29 (×3): 4 [IU] via SUBCUTANEOUS

## 2018-07-18 NOTE — Progress Notes (Signed)
eLink Physician-Brief Progress Note Patient Name: Kristin Moon DOB: 02/23/1946 MRN: [id number]   Date of Service  07/18/2018  HPI/Events of Note  PO4--- = 1.1 and Creatinine = 0.95  eICU Interventions  Will replace PO4---.     Intervention Category Major Interventions: Electrolyte abnormality - evaluation and management  Sommer,Steven Eugene 07/18/2018, 6:14 AM

## 2018-07-18 NOTE — Progress Notes (Signed)
..   NAME:  Kristin Moon, MRN:  [id number], DOB:  Mar 10, 1946, LOS: 3 ADMISSION DATE:  08/04/2018, CONSULTATION DATE:  07/16/2018 REFERRING MD:  [facility] Hospitalist, CHIEF COMPLAINT:  SOB and fever   Brief History    73 yr old female w/ PMHx of NHL, HLD, HTN, Hypothyroidism, LBBB, CAD G1DD presented from home via EMS after coughing up blood and feeling weak, Tested flu positive at her PMD visit on 07/14/2018 and started on Tamiflu as outpatient but continued to worsen Failed BiPAP treatment in ED, intubated for acute respiratory failure.  Past Medical History  NHL (per son last radiation Rx was 40 yrs ago), HLD, HTN, Hypothyroidism, LBBB, CAD, G1DD (EF 50%) w/ a history of pneumonia last summer/fall not requiring hospitalization.   Significant Hospital Events   2/7 Admit with flu infection, pneumococcus PNA, intubated  Consults:  PCCM  Procedures:  Endotracheal intubation- 07/16/2018 >> CVC insertion - 07/16/2018  Thoracentesis 2/7 Bronch, BAL 2/7  Significant Diagnostic Tests:  Chest x-ray 2/9- slight improvement in dense left lung consolidation.  I have reviewed the images personally.  Micro Data: ( pending)  Blood cx x 2-->  resp cx--> pending RVP--> Flu B Influenza swab--> Flu B U pneumococcus 2/7 > positive  Antimicrobials:  Vancomycin: 2/6 > 2/7 Cefepime: 2/6 >> Tamiflu 2/5 >>  Subjective Off pressors, PEEP and FiO2 are weaning down.  No acute events overnight.  Objective   Blood pressure 138/70, pulse (!) 112, temperature (!) 100.9 F (38.3 C), temperature source Axillary, resp. rate 15, height _0  (1.651 m), weight 76 kg, SpO2 100 %. CVP:  [10 mmHg-12 mmHg] 11 mmHg  Vent Mode: PRVC FiO2 (%):  [40 %] 40 % Set Rate:  [22 bmp] 22 bmp Vt Set:  [350 mL] 350 mL PEEP:  [5 cmH20-10 cmH20] 5 cmH20 Plateau Pressure:  [17 cmH20-23 cmH20] 17 cmH20   Intake/Output Summary (Last 24 hours) at 07/18/2018 1517 Last data filed at 07/18/2018 5638 Gross per 24 hour  Intake 1458.45 ml    Output 620 ml  Net 838.45 ml   Filed Weights   07/16/18 0500 07/17/18 0500 07/18/18 0436  Weight: 74 kg 75 kg 76 kg   Examination: Gen:      No acute distress HEENT:  EOMI, sclera anicteric Neck:     No masses; no thyromegaly Lungs:    Clear to auscultation bilaterally; normal respiratory effort CV:         Regular rate and rhythm; no murmurs Abd:      + bowel sounds; soft, non-tender; no palpable masses, no distension Ext:    No edema; adequate peripheral perfusion Skin:      Warm and dry; no rash Neuro: Sedated, unresponsive  Assessment & Plan:  73 year old with acute respiratory failure in the setting of flu B infection, Streptococcus pneumonia  Respiratory failure Flu, Streptococcus pneumonia Continue vent support Low TV (6cc/kg) volume ventilation Pressure support weans as tolerated.   Septic shock Off pressors Stop stress dose steroids  Anion gap metabolic acidosis, AKI > improving Monitor urine output and creatinine.  Hypothyroidism, elevated blood sugar Synthroid SSI coverage.Change to resistant scale  Best practice:  Diet: Tube feeds Pain/Anxiety/Delirium protocol (if indicated): continuous fentanyl with prn versed VAP protocol (if indicated): yes DVT prophylaxis: Hep [place] and SCDs GI prophylaxis:  Pepcid IV Glucose control: SSI Mobility: continuous bedrest Code Status: FULL Family Communication: Family updated daily. Disposition: ICU  Labs   CBC: Recent Labs  Lab 07/16/18 0822 07/16/18 1834  07/17/18 0515 07/17/18 1841 07/18/18 0431  WBC 19.5* 19.4* 16.2* 10.2 8.6  NEUTROABS 18.5* 17.7* 14.5* 9.7* 7.5  HGB 10.7* 10.9* 10.3* 8.7* 8.8*  HCT 36.7 36.9 35.0* 30.3* 29.3*  MCV 87.2 86.8 87.3 88.6 89.1  PLT 205 204 200 125* 124*    Basic Metabolic Panel: Recent Labs  Lab 07/16/2018 1912 07/16/18 0224 07/16/18 0552 07/17/18 0515 07/18/18 0431  NA 135 143 139 141 146*  K 4.6 2.9* 4.9 4.5 4.2  CL 100 126* 113* 115* 120*  CO2 19* 12* 17* 19*  21*  GLUCOSE 114* 85 138* 217* 230*  BUN 29* 20 29* 31* 35*  CREATININE 1.52* 0.80 1.20* 1.07* 0.95  CALCIUM 9.6 4.6* 7.6* 8.3* 8.8*  MG  --  1.0* 1.4* 2.4 2.5*  PHOS  --  2.8  --  1.6* 1.1*   GFR: Estimated Creatinine Clearance: 56.2 mL/min (by C-G formula based on SCr of 0.95 mg/dL). Recent Labs  Lab 07/16/2018 1912 07/16/18 0224 07/16/18 0348  07/16/18 1834 07/17/18 0515 07/17/18 0844 07/17/18 1841 07/18/18 0431  WBC 8.5 8.5  --    < > 19.4* 16.2*  --  10.2 8.6  LATICACIDVEN 1.6 2.5* 2.3*  --   --   --  1.9  --   --    < > = values in this interval not displayed.    Liver Function Tests: Recent Labs  Lab 08/01/2018 1912 07/16/18 0224 07/16/18 1320  AST 64* 22  --   ALT 61* 26  --   ALKPHOS 158* 53  --   BILITOT 1.7* 0.7  --   PROT 7.2 <3.0* 5.4*  ALBUMIN 3.5 1.3*  --    No results for input(s): LIPASE, AMYLASE in the last 168 hours. No results for input(s): AMMONIA in the last 168 hours.  ABG    Component Value Date/Time   PHART 7.267 (L) 07/16/2018 1035   PCO2ART 37.7 07/16/2018 1035   PO2ART 191 (H) 07/16/2018 1035   HCO3 16.6 (L) 07/16/2018 1035   TCO2 24 10/17/2010 1811   TCO2 26 10/17/2010 1811   ACIDBASEDEF 9.0 (H) 07/16/2018 1035   O2SAT 99.0 07/16/2018 1035     Coagulation Profile: Recent Labs  Lab 07/16/2018 1912  INR 1.08    Cardiac Enzymes: No results for input(s): CKTOTAL, CKMB, CKMBINDEX, TROPONINI in the last 168 hours.  HbA1C: No results found for: HGBA1C  CBG: Recent Labs  Lab 07/17/18 1959 07/17/18 2349 07/18/18 0330 07/18/18 0818 07/18/18 1221  GLUCAP 255* 219* 209* 173* 203*   The patient is critically ill with multiple organ system failure and requires high complexity decision making for assessment and support, frequent evaluation and titration of therapies, advanced monitoring, review of radiographic studies and interpretation of complex data.   Critical Care Time devoted to patient care services, exclusive of separately  billable procedures, described in this note is 35 minutes.   Marshell Garfinkel MD [place] [facility] Pager [phone] If no answer call [phone] 07/18/2018, 3:24 PM

## 2018-07-19 ENCOUNTER — Inpatient Hospital Stay (HOSPITAL_COMMUNITY): Payer: Medicare Other

## 2018-07-19 LAB — CBC WITH DIFFERENTIAL/PLATELET
Abs Immature Granulocytes: 0.19 10*3/uL — ABNORMAL HIGH (ref 0.00–0.07)
Abs Immature Granulocytes: 0.1 10*3/uL — ABNORMAL HIGH (ref 0.00–0.07)
Basophils Absolute: 0 10*3/uL (ref 0.0–0.1)
Basophils Absolute: 0 10*3/uL (ref 0.0–0.1)
Basophils Relative: 0 %
Basophils Relative: 0 %
Eosinophils Absolute: 0 10*3/uL (ref 0.0–0.5)
Eosinophils Absolute: 0 10*3/uL (ref 0.0–0.5)
Eosinophils Relative: 0 %
Eosinophils Relative: 0 %
HCT: 32 % — ABNORMAL LOW (ref 36.0–46.0)
HEMATOCRIT: 29.8 % — AB (ref 36.0–46.0)
Hemoglobin: 9 g/dL — ABNORMAL LOW (ref 12.0–15.0)
Hemoglobin: 9.2 g/dL — ABNORMAL LOW (ref 12.0–15.0)
Immature Granulocytes: 2 %
LYMPHS PCT: 17 %
Lymphocytes Relative: 9 %
Lymphs Abs: 0.8 10*3/uL (ref 0.7–4.0)
Lymphs Abs: 1.6 10*3/uL (ref 0.7–4.0)
MCH: 26.9 pg (ref 26.0–34.0)
MCH: 25.3 pg — ABNORMAL LOW (ref 26.0–34.0)
MCHC: 30.2 g/dL (ref 30.0–36.0)
MCHC: 28.8 g/dL — ABNORMAL LOW (ref 30.0–36.0)
MCV: 89 fL (ref 80.0–100.0)
MCV: 88.2 fL (ref 80.0–100.0)
MONO ABS: 0.8 10*3/uL (ref 0.1–1.0)
Monocytes Absolute: 0.8 10*3/uL (ref 0.1–1.0)
Monocytes Relative: 9 %
Monocytes Relative: 8 %
NEUTROS ABS: 6.9 10*3/uL (ref 1.7–7.7)
NRBC: 3 /100{WBCs} — AB
Neutro Abs: 7.1 10*3/uL (ref 1.7–7.7)
Neutrophils Relative %: 80 %
Neutrophils Relative %: 74 %
PROMYELOCYTES RELATIVE: 1 %
Platelets: 141 10*3/uL — ABNORMAL LOW (ref 150–400)
Platelets: 176 10*3/uL (ref 150–400)
RBC: 3.35 MIL/uL — ABNORMAL LOW (ref 3.87–5.11)
RBC: 3.63 MIL/uL — ABNORMAL LOW (ref 3.87–5.11)
RDW: 21.2 % — ABNORMAL HIGH (ref 11.5–15.5)
RDW: 21.3 % — ABNORMAL HIGH (ref 11.5–15.5)
WBC: 8.7 10*3/uL (ref 4.0–10.5)
WBC: 9.6 10*3/uL (ref 4.0–10.5)
nRBC: 0.6 % — ABNORMAL HIGH (ref 0.0–0.2)
nRBC: 2.9 % — ABNORMAL HIGH (ref 0.0–0.2)

## 2018-07-19 LAB — GLUCOSE, CAPILLARY
GLUCOSE-CAPILLARY: 193 mg/dL — AB (ref 70–99)
Glucose-Capillary: 180 mg/dL — ABNORMAL HIGH (ref 70–99)
Glucose-Capillary: 191 mg/dL — ABNORMAL HIGH (ref 70–99)
Glucose-Capillary: 250 mg/dL — ABNORMAL HIGH (ref 70–99)
Glucose-Capillary: 132 mg/dL — ABNORMAL HIGH (ref 70–99)

## 2018-07-19 LAB — APTT: APTT: 27 s (ref 24–36)

## 2018-07-19 LAB — BLOOD GAS, ARTERIAL
Acid-base deficit: 4.4 mmol/L — ABNORMAL HIGH (ref 0.0–2.0)
BICARBONATE: 20 mmol/L (ref 20.0–28.0)
Drawn by: 414221
FIO2: 40
LHR: 22 {breaths}/min
MECHVT: 350 mL
O2 Saturation: 99 %
PEEP: 5 cmH2O
Patient temperature: 101.1
pCO2 arterial: 38.3 mmHg (ref 32.0–48.0)
pH, Arterial: 7.347 — ABNORMAL LOW (ref 7.350–7.450)
pO2, Arterial: 162 mmHg — ABNORMAL HIGH (ref 83.0–108.0)

## 2018-07-19 LAB — BASIC METABOLIC PANEL
Anion gap: 6 (ref 5–15)
BUN: 41 mg/dL — ABNORMAL HIGH (ref 8–23)
CALCIUM: 8.8 mg/dL — AB (ref 8.9–10.3)
CO2: 21 mmol/L — AB (ref 22–32)
Chloride: 120 mmol/L — ABNORMAL HIGH (ref 98–111)
Creatinine, Ser: 0.95 mg/dL (ref 0.44–1.00)
GFR calc Af Amer: >60 mL/min (ref >60)
GLUCOSE: 206 mg/dL — AB (ref 70–99)
Potassium: 5.3 mmol/L — ABNORMAL HIGH (ref 3.5–5.1)
Sodium: 147 mmol/L — ABNORMAL HIGH (ref 135–145)

## 2018-07-19 LAB — POCT I-STAT 7, (LYTES, BLD GAS, ICA,H+H)
Acid-base deficit: 2 mmol/L (ref 0.0–2.0)
Bicarbonate: 24.3 mmol/L (ref 20.0–28.0)
Calcium, Ion: 1.16 mmol/L (ref 1.15–1.40)
HCT: 43 % (ref 36.0–46.0)
Hemoglobin: 14.6 g/dL (ref 12.0–15.0)
O2 Saturation: 94 %
Patient temperature: 98.6
Potassium: 3.2 mmol/L — ABNORMAL LOW (ref 3.5–5.1)
Sodium: 143 mmol/L (ref 135–145)
TCO2: 26 mmol/L (ref 22–32)
pCO2 arterial: 43.9 mmHg (ref 32.0–48.0)
pH, Arterial: 7.351 (ref 7.350–7.450)
pO2, Arterial: 74 mmHg — ABNORMAL LOW (ref 83.0–108.0)

## 2018-07-19 LAB — MAGNESIUM: Magnesium: 2.5 mg/dL — ABNORMAL HIGH (ref 1.7–2.4)

## 2018-07-19 LAB — CULTURE, BAL-QUANTITATIVE W GRAM STAIN: Culture: NO GROWTH

## 2018-07-19 LAB — BODY FLUID CULTURE: CULTURE: NO GROWTH

## 2018-07-19 LAB — CULTURE, BAL-QUANTITATIVE

## 2018-07-19 LAB — PHOSPHORUS: Phosphorus: 1.6 mg/dL — ABNORMAL LOW (ref 2.5–4.6)

## 2018-07-19 LAB — PROTIME-INR
INR: 1.34
Prothrombin Time: 16.5 seconds — ABNORMAL HIGH (ref 11.4–15.2)

## 2018-07-19 LAB — PATHOLOGIST SMEAR REVIEW

## 2018-07-19 MED ORDER — FREE WATER
200.0000 mL | Status: DC
  Administered 2018-07-19 – 2018-07-22 (×21): 200 mL

## 2018-07-19 MED ORDER — FENTANYL CITRATE (PF) 100 MCG/2ML IJ SOLN
INTRAMUSCULAR | Status: AC
  Filled 2018-07-19: qty 2

## 2018-07-19 MED ORDER — DEXMEDETOMIDINE HCL IN NACL 400 MCG/100ML IV SOLN
0.0000 ug/kg/h | INTRAVENOUS | Status: AC
  Administered 2018-07-19 (×2): 1.2 ug/kg/h via INTRAVENOUS
  Administered 2018-07-19: 0.5 ug/kg/h via INTRAVENOUS
  Administered 2018-07-20: 1.1 ug/kg/h via INTRAVENOUS
  Administered 2018-07-20: 1.2 ug/kg/h via INTRAVENOUS
  Administered 2018-07-20 (×2): 1.1 ug/kg/h via INTRAVENOUS
  Administered 2018-07-21: 1 ug/kg/h via INTRAVENOUS
  Administered 2018-07-21 (×4): 1.2 ug/kg/h via INTRAVENOUS
  Administered 2018-07-22 (×2): 0.9 ug/kg/h via INTRAVENOUS
  Filled 2018-07-19 (×16): qty 100

## 2018-07-19 MED ORDER — FENTANYL CITRATE (PF) 100 MCG/2ML IJ SOLN
50.0000 ug | INTRAMUSCULAR | Status: DC | PRN
  Administered 2018-07-19: 50 ug via INTRAVENOUS
  Filled 2018-07-19: qty 2

## 2018-07-19 MED ORDER — SODIUM CHLORIDE 0.9 % IV SOLN
2.0000 g | INTRAVENOUS | Status: DC
  Filled 2018-07-19: qty 20

## 2018-07-19 MED ORDER — FENTANYL CITRATE (PF) 100 MCG/2ML IJ SOLN
50.0000 ug | INTRAMUSCULAR | Status: DC | PRN
  Administered 2018-07-19 – 2018-07-22 (×3): 50 ug via INTRAVENOUS
  Filled 2018-07-19 (×3): qty 2

## 2018-07-19 MED ORDER — OSELTAMIVIR PHOSPHATE 6 MG/ML PO SUSR
30.0000 mg | Freq: Two times a day (BID) | ORAL | Status: AC
  Administered 2018-07-19: 30 mg
  Filled 2018-07-19: qty 12.5

## 2018-07-19 MED ORDER — FENTANYL CITRATE (PF) 100 MCG/2ML IJ SOLN
100.0000 ug | Freq: Once | INTRAMUSCULAR | Status: AC
  Administered 2018-07-19: 100 ug via INTRAVENOUS

## 2018-07-19 MED ORDER — SODIUM CHLORIDE 0.45 % IV SOLN
INTRAVENOUS | Status: DC
  Administered 2018-07-19: 1000 mL via INTRAVENOUS

## 2018-07-19 MED ORDER — FENTANYL 2500MCG IN NS 250ML (10MCG/ML) PREMIX INFUSION
0.0000 ug/h | INTRAVENOUS | Status: DC
  Administered 2018-07-20: 50 ug/h via INTRAVENOUS
  Administered 2018-07-21: 75 ug/h via INTRAVENOUS
  Administered 2018-07-22: 100 ug/h via INTRAVENOUS
  Filled 2018-07-19 (×2): qty 250

## 2018-07-19 MED ORDER — FAMOTIDINE 40 MG/5ML PO SUSR
20.0000 mg | Freq: Two times a day (BID) | ORAL | Status: DC
  Administered 2018-07-19 – 2018-07-29 (×20): 20 mg
  Filled 2018-07-19 (×20): qty 2.5

## 2018-07-19 MED ORDER — FUROSEMIDE 10 MG/ML IJ SOLN
40.0000 mg | Freq: Two times a day (BID) | INTRAMUSCULAR | Status: DC
  Administered 2018-07-19 – 2018-07-20 (×2): 40 mg via INTRAVENOUS
  Filled 2018-07-19 (×2): qty 4

## 2018-07-19 MED ORDER — FAMOTIDINE 40 MG/5ML PO SUSR: 20.0000 mg | Freq: Two times a day (BID) | ORAL | Status: DC

## 2018-07-19 MED ORDER — SODIUM PHOSPHATES 45 MMOLE/15ML IV SOLN
20.0000 mmol | Freq: Once | INTRAVENOUS | Status: AC
  Administered 2018-07-19: 20 mmol via INTRAVENOUS
  Filled 2018-07-19: qty 6.67

## 2018-07-19 MED ORDER — SODIUM CHLORIDE 0.9 % IV SOLN
2.0000 g | INTRAVENOUS | Status: DC
  Administered 2018-07-19 – 2018-07-20 (×2): 2 g via INTRAVENOUS
  Filled 2018-07-19 (×2): qty 20

## 2018-07-19 MED ORDER — SODIUM CHLORIDE 0.9 % IV SOLN: 2.0000 g | INTRAVENOUS | Status: DC

## 2018-07-19 NOTE — Progress Notes (Signed)
Transported pt to CT and back. Tolerated well, will continue to monitor.

## 2018-07-19 NOTE — Progress Notes (Addendum)
Weaning terminated per RN at 1500 by MD.

## 2018-07-19 NOTE — Progress Notes (Signed)
..   NAME:  Kristin Moon, MRN:  [id number], DOB:  [birth year], LOS: 4 ADMISSION DATE:  07/17/2018, CONSULTATION DATE:  07/16/2018 REFERRING MD:  [facility] Hospitalist, CHIEF COMPLAINT:  SOB and fever   Brief History    73 yr old female w/ PMHx of NHL, HLD, HTN, Hypothyroidism, LBBB, CAD G1DD presented from home via EMS after coughing up blood and feeling weak, Tested flu positive at her PMD visit on 07/14/2018 and started on Tamiflu as outpatient but continued to worsen Failed BiPAP treatment in ED, intubated for acute respiratory failure.  Past Medical History  NHL (per son last radiation Rx was 61 yrs ago), HLD, HTN, Hypothyroidism, LBBB, CAD, G1DD (EF 50%) w/ a history of pneumonia last summer/fall not requiring hospitalization.   Significant Hospital Events   2/7 Admit with flu infection, pneumococcus PNA, intubated 2/10:   Consults:  PCCM  Procedures:  Endotracheal intubation- 07/16/2018 >> CVC insertion - 07/16/2018 Thoracentesis 2/7 Bronch, BAL 2/7  Significant Diagnostic Tests:  Chest x-ray 2/9- slight improvement in dense left lung consolidation.  I have reviewed the images personally.  Micro Data: ( pending)  Blood cx x 2-->  resp cx--> neg RVP--> Flu B Influenza swab--> Flu B U pneumococcus 2/7 > positive  Antimicrobials:  Vancomycin: 2/6 > 2/7 Cefepime: 2/6 >> 2/10  Ceftriaxone 2/10 >>> Tamiflu 2/5 >>  Subjective    No distress.  Objective   Blood pressure 138/72, pulse (Abnormal) 121, temperature (Abnormal) 101 F (38.3 C), temperature source Oral, resp. rate 17, height _0  (1.651 m), weight 75 kg, SpO2 99 %. CVP:  [10 mmHg-12 mmHg] 12 mmHg  Vent Mode: CPAP;PSV FiO2 (%):  [40 %] 40 % Set Rate:  [22 bmp] 22 bmp Vt Set:  [350 mL] 350 mL PEEP:  [5 cmH20] 5 cmH20 Pressure Support:  [10 cmH20] 10 cmH20 Plateau Pressure:  [16 cmH20-20 cmH20] 20 cmH20   Intake/Output Summary (Last 24 hours) at 07/19/2018 1154 Last data filed at 07/19/2018 1100 Gross per 24 hour    Intake 2065.12 ml  Output 1050 ml  Net 1015.12 ml   Filed Weights   07/17/18 0500 07/18/18 0436 07/19/18 0500  Weight: 75 kg 76 kg 75 kg   Examination:  General: 73 year old female currently on full ventilator support.  Has significant accessory use during weaning attempt HEENT normocephalic atraumatic orally intubated Pulmonary: Diffuse wheezing, positive accessory use, decreased both bases left greater than right Cardiac: Tachycardic regular rate and rhythm Abdomen: Soft nontender  extremities: Dependent edema Neuro: Sedated   Septic shock Anion gap metabolic acidosis  AKI    Assessment & Plan:  73 year old with acute respiratory failure in the setting of flu B infection, Streptococcus pneumonia  Respiratory failure 2/2 Flu, Streptococcus pneumonia and left exudative effusion pcxr ETT good position. Left > right airspace disease w/ some worsening in the right base.  Plan Cont full vent support CT chest evaluate effusions.  Consider CT  IV lasix Repeat AM CXR Cont to trend fever and wbc ct  Day # 5 cefepime and tamiflu (stop at 5 d for tamiflu) & change to ceftriaxone.   Fluid and electrolyte imbalance: hypernatremia, hyperkalemia, hypophosphatemia, Mild NAG metabolic acidosis 2/2 hyperchloremia from NaCl Plan Change IVF to 1/2 ns  Adding free water via tube Lasix X 2 Am chemistry   Acute metabolic encephalopathy Plan PAD protocol RASS goal 0 to -1 Change to precedex and PRN fent  Hypothyroidism, elevated blood sugar Plan Cont ssi and synthroid   Anemia  of critical illness Plan Trend cbc  Best practice:  Diet: Tube feeds Pain/Anxiety/Delirium protocol (if indicated): continuous fentanyl with prn versed VAP protocol (if indicated): yes DVT prophylaxis: Hep [place] and SCDs GI prophylaxis:  Pepcid IV Glucose control: SSI Mobility: continuous bedrest Code Status: FULL Family Communication: Family updated daily. Disposition: ICU remains critically ill.   Requiring close titration of diuretics, ongoing antibiotics, titration of ventilatory support, and evaluation of pleural space given ongoing pleural effusion.  Erick Colace ACNP-BC [facility] Pager # [phone] OR # [phone] if no answer

## 2018-07-19 NOTE — Progress Notes (Signed)
Call made to E-link to make sure MD knows that CT Chest results are resulted and in chart. Pt stable at this time.

## 2018-07-19 NOTE — Progress Notes (Signed)
eLink Physician-Brief Progress Note Patient Name: Kristin Moon DOB: 12/15/1945 MRN: [id number]   Date of Service  07/19/2018  HPI/Events of Note  Chest CT reveals: 1. Small moderate right pleural effusion and moderate to large left pleural effusion. 2. Diffuse consolidation within the left lung with partial consolidation and ground-glass density in the right upper and lower lobes suspicious for multifocal pneumonia 3. Endotracheal tube tip just above the carina. 4. 24 mm rounded hypodensity within the spleen, incompletely characterized. Consider further evaluation with MRI or contrast-enhanced exam when clinically feasible.  Platelets = 176K.  eICU Interventions  Will order: 1. PT/INR and PTT now.  2. Will inform ground team of results to consider thoracentesis.       Intervention Category Intermediate Interventions: Diagnostic test evaluation  Sommer,Steven Cornelia Copa 07/19/2018, 7:55 PM

## 2018-07-19 NOTE — Progress Notes (Signed)
Pt received 40 Lasix IV - stopped Fentanyl gtt & started precedex gtt titrating for sedation and to help lower HR  Pt given 200 cc free water per OG tube GFamily up on all changes made - pending CT of chest today

## 2018-07-19 NOTE — Progress Notes (Signed)
PCCM Interval Progress Note  Asked to evaluate pt at bedside with ultrasound given CT findings of mod effusions.  Bedside POCUS performed with Dr. Sabra Heck on both right and left pleural space.  No free flowing fluid noted and no safe windows for thoracentesis.  Likely needs TCTS consult for VATS.   Montey Hora, [place] [facility] Pager: [phone].  If no answer, [phone] 07/19/2018, 9:57 PM

## 2018-07-20 ENCOUNTER — Inpatient Hospital Stay (HOSPITAL_COMMUNITY): Payer: Medicare Other

## 2018-07-20 ENCOUNTER — Encounter: Payer: Self-pay | Admitting: Podiatry

## 2018-07-20 DIAGNOSIS — Z9689 Presence of other specified functional implants: Secondary | ICD-10-CM

## 2018-07-20 LAB — CULTURE, BLOOD (ROUTINE X 2)
Culture: NO GROWTH
Culture: NO GROWTH
Special Requests: ADEQUATE

## 2018-07-20 LAB — GLUCOSE, CAPILLARY
Glucose-Capillary: 154 mg/dL — ABNORMAL HIGH (ref 70–99)
Glucose-Capillary: 168 mg/dL — ABNORMAL HIGH (ref 70–99)
Glucose-Capillary: 182 mg/dL — ABNORMAL HIGH (ref 70–99)
Glucose-Capillary: 191 mg/dL — ABNORMAL HIGH (ref 70–99)
Glucose-Capillary: 227 mg/dL — ABNORMAL HIGH (ref 70–99)
Glucose-Capillary: 248 mg/dL — ABNORMAL HIGH (ref 70–99)

## 2018-07-20 LAB — CBC WITH DIFFERENTIAL/PLATELET
Abs Immature Granulocytes: 0 10*3/uL (ref 0.00–0.07)
Band Neutrophils: 2 %
Band Neutrophils: 2 %
Basophils Absolute: 0.1 10*3/uL (ref 0.0–0.1)
Basophils Absolute: 0 10*3/uL (ref 0.0–0.1)
Basophils Relative: 1 %
Basophils Relative: 0 %
Eosinophils Absolute: 0 10*3/uL (ref 0.0–0.5)
Eosinophils Absolute: 0.2 10*3/uL (ref 0.0–0.5)
Eosinophils Relative: 0 %
Eosinophils Relative: 2 %
HCT: 33.7 % — ABNORMAL LOW (ref 36.0–46.0)
HCT: 33.9 % — ABNORMAL LOW (ref 36.0–46.0)
Hemoglobin: 10.1 g/dL — ABNORMAL LOW (ref 12.0–15.0)
Hemoglobin: 10 g/dL — ABNORMAL LOW (ref 12.0–15.0)
Lymphocytes Relative: 30 %
Lymphocytes Relative: 15 %
Lymphs Abs: 3 10*3/uL (ref 0.7–4.0)
Lymphs Abs: 1.5 10*3/uL (ref 0.7–4.0)
MCH: 25.9 pg — ABNORMAL LOW (ref 26.0–34.0)
MCH: 25.5 pg — ABNORMAL LOW (ref 26.0–34.0)
MCHC: 30 g/dL (ref 30.0–36.0)
MCHC: 29.5 g/dL — ABNORMAL LOW (ref 30.0–36.0)
MCV: 86.4 fL (ref 80.0–100.0)
MCV: 86.5 fL (ref 80.0–100.0)
METAMYELOCYTES PCT: 2 %
MONO ABS: 1.1 10*3/uL — AB (ref 0.1–1.0)
Monocytes Absolute: 0.3 10*3/uL (ref 0.1–1.0)
Monocytes Relative: 11 %
Monocytes Relative: 3 %
Myelocytes: 2 %
NRBC: 2 /100{WBCs} — AB
Neutro Abs: 5.4 10*3/uL (ref 1.7–7.7)
Neutro Abs: 8.2 10*3/uL — ABNORMAL HIGH (ref 1.7–7.7)
Neutrophils Relative %: 51 %
Neutrophils Relative %: 78 %
Platelets: 197 10*3/uL (ref 150–400)
Platelets: 193 10*3/uL (ref 150–400)
Promyelocytes Relative: 1 %
RBC: 3.9 MIL/uL (ref 3.87–5.11)
RBC: 3.92 MIL/uL (ref 3.87–5.11)
RDW: 21.4 % — ABNORMAL HIGH (ref 11.5–15.5)
RDW: 20.9 % — ABNORMAL HIGH (ref 11.5–15.5)
WBC: 10.1 10*3/uL (ref 4.0–10.5)
WBC: 10.2 10*3/uL (ref 4.0–10.5)
nRBC: 2.8 % — ABNORMAL HIGH (ref 0.0–0.2)
nRBC: 2 % — ABNORMAL HIGH (ref 0.0–0.2)
nRBC: 5 /100 WBC — ABNORMAL HIGH

## 2018-07-20 LAB — PROCALCITONIN: Procalcitonin: 7.15 ng/mL

## 2018-07-20 LAB — GRAM STAIN

## 2018-07-20 LAB — COMPREHENSIVE METABOLIC PANEL
ALT: 1433 U/L — ABNORMAL HIGH (ref 0–44)
AST: 2296 U/L — ABNORMAL HIGH (ref 15–41)
Albumin: 1.9 g/dL — ABNORMAL LOW (ref 3.5–5.0)
Alkaline Phosphatase: 145 U/L — ABNORMAL HIGH (ref 38–126)
Anion gap: 10 (ref 5–15)
BUN: 59 mg/dL — ABNORMAL HIGH (ref 8–23)
CO2: 21 mmol/L — ABNORMAL LOW (ref 22–32)
Calcium: 8.8 mg/dL — ABNORMAL LOW (ref 8.9–10.3)
Chloride: 118 mmol/L — ABNORMAL HIGH (ref 98–111)
Creatinine, Ser: 1.35 mg/dL — ABNORMAL HIGH (ref 0.44–1.00)
GFR calc Af Amer: 46 mL/min — ABNORMAL LOW (ref >60)
GFR, EST NON AFRICAN AMERICAN: 40 mL/min — AB (ref >60)
Glucose, Bld: 171 mg/dL — ABNORMAL HIGH (ref 70–99)
Potassium: 4.7 mmol/L (ref 3.5–5.1)
Sodium: 149 mmol/L — ABNORMAL HIGH (ref 135–145)
Total Bilirubin: 1.1 mg/dL (ref 0.3–1.2)
Total Protein: 5.3 g/dL — ABNORMAL LOW (ref 6.5–8.1)

## 2018-07-20 LAB — PHOSPHORUS: PHOSPHORUS: 2.9 mg/dL (ref 2.5–4.6)

## 2018-07-20 LAB — BODY FLUID CELL COUNT WITH DIFFERENTIAL
EOS FL: 0 %
Lymphs, Fluid: 64 %
MONOCYTE-MACROPHAGE-SEROUS FLUID: 20 % — AB (ref 50–90)
Neutrophil Count, Fluid: 16 % (ref 0–25)
Total Nucleated Cell Count, Fluid: 2020 cu mm — ABNORMAL HIGH (ref 0–1000)

## 2018-07-20 LAB — LACTATE DEHYDROGENASE, PLEURAL OR PERITONEAL FLUID: LD, Fluid: 392 U/L — ABNORMAL HIGH (ref 3–23)

## 2018-07-20 LAB — PROTEIN, PLEURAL OR PERITONEAL FLUID: Total protein, fluid: <3 g/dL

## 2018-07-20 LAB — CHOLESTEROL, TOTAL: Cholesterol: 116 mg/dL (ref 0–200)

## 2018-07-20 LAB — LACTATE DEHYDROGENASE: LDH: 1179 U/L — ABNORMAL HIGH (ref 98–192)

## 2018-07-20 LAB — PATHOLOGIST SMEAR REVIEW

## 2018-07-20 LAB — RHEUMATOID FACTORS, FLUID: Rheumatoid Arthritis, Qn/Fluid: NEGATIVE

## 2018-07-20 LAB — PROTEIN, TOTAL: Total Protein: 4.8 g/dL — ABNORMAL LOW (ref 6.5–8.1)

## 2018-07-20 MED ORDER — LIDOCAINE HCL (PF) 1 % IJ SOLN
INTRAMUSCULAR | Status: AC
  Administered 2018-07-20: 10 mL
  Filled 2018-07-20: qty 5

## 2018-07-20 MED ORDER — ETOMIDATE 2 MG/ML IV SOLN
INTRAVENOUS | Status: AC
  Administered 2018-07-20: 20 mg
  Filled 2018-07-20: qty 10

## 2018-07-20 MED ORDER — DEXTROSE 5 % IV SOLN
INTRAVENOUS | Status: DC
  Administered 2018-07-20 – 2018-07-21 (×2): via INTRAVENOUS

## 2018-07-20 MED ORDER — LIDOCAINE HCL (PF) 1 % IJ SOLN
INTRAMUSCULAR | Status: AC
  Administered 2018-07-20: 5 mL
  Filled 2018-07-20: qty 5

## 2018-07-20 NOTE — Progress Notes (Signed)
   07/20/18 0453 07/20/18 0614  Vitals  Temp (!) 103.1 F (39.5 C) (!) 102 F (38.9 C)  Temp Source Oral Oral   Tylenol given and ice pack applied at 5am with slight decrease. MD Oletta Darter made aware. No interventions at this time. Will continue to monitor.

## 2018-07-20 NOTE — Progress Notes (Addendum)
..   NAME:  Kristin Moon, MRN:  [id number], DOB:  [birth year], LOS: 5 ADMISSION DATE:  07/10/2018, CONSULTATION DATE:  07/16/2018 REFERRING MD:  [facility] Hospitalist, CHIEF COMPLAINT:  SOB and fever   Brief History    73 yr old female w/ PMHx of NHL, HLD, HTN, Hypothyroidism, LBBB, CAD G1DD presented from home via EMS after coughing up blood and feeling weak, Tested flu positive at her PMD visit on 07/14/2018 and started on Tamiflu as outpatient but continued to worsen Failed BiPAP treatment in ED, intubated for acute respiratory failure.  Past Medical History  NHL (per son last radiation Rx was 84 yrs ago), HLD, HTN, Hypothyroidism, LBBB, CAD, G1DD (EF 50%) w/ a history of pneumonia last summer/fall not requiring hospitalization.   Significant Hospital Events   2/7 Admit with flu infection, pneumococcus PNA, intubated 2/10: CT chest ordered to further evaluate pleural space.  Findings consistent with large left and small to moderate right effusion 2/11: Spiking temperature of 104, cultures resent.  Left chest tube placed.  LFTs significantly elevated, ultrasound ordered Consults:  PCCM  Procedures:  Endotracheal intubation- 07/16/2018 >> CVC insertion - 07/16/2018 Thoracentesis 2/7 Bronch, BAL 2/7  Significant Diagnostic Tests:  Chest x-ray 2/9- slight improvement in dense left lung consolidation.  I have reviewed the images personally. CT chest 2/10: Bibasilar left greater than right pneumonia/consolidation with moderate to large left effusion and small to moderate right effusion. Right upper quadrant ultrasound 2/11>>> Micro Data: ( pending)  Blood cx x 2--> negative resp cx--> neg RVP--> Flu B Influenza swab--> Flu B U pneumococcus 2/7 > positive Blood 2/11>> Urine 2/11>>> Sputum 2/11>>> Left pleural fluid 2/11>>>  Antimicrobials:  Vancomycin: 2/6 > 2/7 Cefepime: 2/6 >> 2/10  Ceftriaxone 2/10 >>> Tamiflu 2/5 >>  Subjective   Spiking fever Objective   Blood pressure (Abnormal)  114/52, pulse 91, temperature (Abnormal) 104.2 F (40.1 C), temperature source Rectal, resp. rate (Abnormal) 27, height _0  (1.651 m), weight 80.5 kg, SpO2 100 %. CVP:  [13 mmHg-17 mmHg] 14 mmHg  Vent Mode: CPAP;PSV FiO2 (%):  [40 %] 40 % Set Rate:  [22 bmp] 22 bmp Vt Set:  [350 mL] 350 mL PEEP:  [5 cmH20] 5 cmH20 Pressure Support:  [5 cmH20] 5 cmH20 Plateau Pressure:  [19 cmH20] 19 cmH20   Intake/Output Summary (Last 24 hours) at 07/20/2018 0947 Last data filed at 07/20/2018 0900 Gross per 24 hour  Intake 3446.55 ml  Output 2210 ml  Net 1236.55 ml   Filed Weights   07/18/18 0436 07/19/18 0500 07/20/18 0500  Weight: 76 kg 75 kg 80.5 kg   Examination: General: This is a 73 year old white female currently sedated on the ventilator HEENT normocephalic atraumatic no jugular venous distention Pulmonary: Diminished both bases with faint expiratory wheeze Cardiac: Regular rate and rhythm Abdomen: Soft nontender no organomegaly Extremities: Trace lower extremity edema no significant ecchymosis, strong pulses Neuro: Sedated currently GU: Clear yellow.   Septic shock Anion gap metabolic acidosis  AKI    Assessment & Plan:  73 year old with acute respiratory failure in the setting of flu B infection, Streptococcus pneumonia  Respiratory failure 2/2 Flu, Streptococcus pneumonia and left exudative effusion Portable chest x-ray and CT imaging both evaluated.  She continues to have fairly large to moderate left-sided pleural effusion with dense consolidation, also has moderate small right effusion with right basilar airspace disease Plan Continuing full ventilator support with daily evaluation for spontaneous breathing trial and pressure support  She will need drainage  of her left chest, will talk to attending physician in regards to chest tube versus repeat thoracentesis.  At this this would be the second time I would favor tube thoracostomy  Hold lasix today  Day #6 antibiotics,  currently on ceftriaxone.  Tamiflu course has been completed Continue VAP bundle X-ray in a.m.  Fever W/out sig fever spike Plan Pan culture  abd [place]  No change in abx as of yet unless PCT spiked  Abnormal LFTs. ? Acalculous cholecystitis?  Plan Ck abd [place]  Trend LFTs  Fluid and electrolyte imbalance: hypernatremia, hyperkalemia, Mild NAG metabolic acidosis 2/2 hyperchloremia from NaCl Plan  adding free water Hold lasix today  Am chemistry   Acute metabolic encephalopathy Plan PAD protocol  RAS goal 0 to -1  Hypothyroidism, elevated blood sugar Plan Cont ssi and synthroid   Anemia of critical illness Plan Trend cbc  Best practice:  Diet: Tube feeds Pain/Anxiety/Delirium protocol (if indicated): continuous fentanyl with prn versed VAP protocol (if indicated): yes DVT prophylaxis: Hep [place] and SCDs GI prophylaxis:  Pepcid IV Glucose control: SSI Mobility: continuous bedrest Code Status: FULL Family Communication: Family updated daily. Disposition she remains critically ill.  Has significant left sided pleural effusion which will need to be drained.  She spiking new fever, we will reculture blood, urine, sputum, pleural fluid.  Also has significantly elevated LFTs, I wonder about a calculus cholecystitis in this setting.  We will get an abdominal ultrasound as well.    Erick Colace ACNP-BC [place] Pager # [phone] OR # [phone] if no answer     ATTESTATION & SIGNATURE   STAFF NOTE: I, Dr Ann Lions have personally reviewed patient's available data, including medical history, events of note, physical examination and test results as part of my evaluation. I have discussed with resident/NP and other care providers such as pharmacist, RN and RRT.  In addition,  I personally evaluated patient and elicited key findings of   S: Date of admit 07/27/2018 with LOS 5 for today 07/20/2018 - febrile, LEft effusion with consolidation  +. Advised chest tube  . -> APP placed chest tube  O: 40% fio2, no pressors. On fent gtt and precedex gtt RASS -3  A Acute resp falure due to LL copnsolidation pna Left effusion  - likely exudate - s/p chest tube due to fever and volume decompression  P Full vent support abx Await fluid analytics Liberate sedation 07/21/18   The patient is critically ill with multiple organ systems failure and requires high complexity decision making for assessment and support, frequent evaluation and titration of therapies, application of advanced monitoring technologies and extensive interpretation of multiple databases.   Critical Care Time devoted to patient care services described in this note is  30  Minutes. This time reflects time of care of this signee Dr Brand Males. This critical care time does not reflect procedure time, or teaching time or supervisory time of PA/NP/Med student/Med Resident etc but could involve care discussion time     Dr. Brand Males, M.D., [facility].C.P Pulmonary and Critical Care Medicine Staff Physician [facility] Pager: [phone], If no answer or between  15:00h - 7:00h: call [phone]  07/20/2018 2:46 PM

## 2018-07-20 NOTE — Procedures (Signed)
Chest Tube Insertion Procedure Note  Indications:  Clinically significant Effusion  Pre-operative Diagnosis: left effusion  Post-operative Diagnosis: Empyema and Effusion  Procedure Details  Informed consent was obtained for the procedure, including sedation.  Risks of lung perforation, hemorrhage, arrhythmia, and adverse drug reaction were discussed.   After sterile skin prep, using standard technique, a 19 French tube was placed in the left  lateral rib space.  Findings: 1200 ml of serosanguinous fluid obtained  Estimated Blood Loss:  Minimal         Specimens:  Sent serosanguinous fluid              Complications:  None; patient tolerated the procedure well.         Disposition: ICU - intubated and hemodynamically stable.         Condition: stable  Erick Colace ACNP-BC [place] Pager # [phone] OR # [phone] if no answer

## 2018-07-21 ENCOUNTER — Inpatient Hospital Stay (HOSPITAL_COMMUNITY): Payer: Medicare Other

## 2018-07-21 LAB — GLUCOSE, CAPILLARY
GLUCOSE-CAPILLARY: 179 mg/dL — AB (ref 70–99)
Glucose-Capillary: 171 mg/dL — ABNORMAL HIGH (ref 70–99)
Glucose-Capillary: 177 mg/dL — ABNORMAL HIGH (ref 70–99)
Glucose-Capillary: 208 mg/dL — ABNORMAL HIGH (ref 70–99)
Glucose-Capillary: 213 mg/dL — ABNORMAL HIGH (ref 70–99)
Glucose-Capillary: 243 mg/dL — ABNORMAL HIGH (ref 70–99)
Glucose-Capillary: 257 mg/dL — ABNORMAL HIGH (ref 70–99)

## 2018-07-21 LAB — COMPREHENSIVE METABOLIC PANEL
ALT: 1285 U/L — ABNORMAL HIGH (ref 0–44)
AST: 917 U/L — ABNORMAL HIGH (ref 15–41)
Albumin: 1.7 g/dL — ABNORMAL LOW (ref 3.5–5.0)
Alkaline Phosphatase: 118 U/L (ref 38–126)
Anion gap: 9 (ref 5–15)
BUN: 30 mg/dL — ABNORMAL HIGH (ref 8–23)
CHLORIDE: 113 mmol/L — AB (ref 98–111)
CO2: 21 mmol/L — ABNORMAL LOW (ref 22–32)
Calcium: 8.4 mg/dL — ABNORMAL LOW (ref 8.9–10.3)
Creatinine, Ser: 0.77 mg/dL (ref 0.44–1.00)
GFR calc Af Amer: >60 mL/min (ref >60)
GFR calc non Af Amer: >60 mL/min (ref >60)
Glucose, Bld: 168 mg/dL — ABNORMAL HIGH (ref 70–99)
Potassium: 4.2 mmol/L (ref 3.5–5.1)
Sodium: 143 mmol/L (ref 135–145)
Total Bilirubin: 0.6 mg/dL (ref 0.3–1.2)
Total Protein: 5.1 g/dL — ABNORMAL LOW (ref 6.5–8.1)

## 2018-07-21 LAB — CBC WITH DIFFERENTIAL/PLATELET
Abs Immature Granulocytes: 0.3 10*3/uL — ABNORMAL HIGH (ref 0.00–0.07)
Band Neutrophils: 1 %
Basophils Absolute: 0 10*3/uL (ref 0.0–0.1)
Basophils Absolute: 0.1 10*3/uL (ref 0.0–0.1)
Basophils Relative: 0 %
Basophils Relative: 1 %
Eosinophils Absolute: 0.5 10*3/uL (ref 0.0–0.5)
Eosinophils Absolute: 0.2 10*3/uL (ref 0.0–0.5)
Eosinophils Relative: 3 %
Eosinophils Relative: 2 %
HCT: 33.7 % — ABNORMAL LOW (ref 36.0–46.0)
HCT: 30.2 % — ABNORMAL LOW (ref 36.0–46.0)
HEMOGLOBIN: 10.5 g/dL — AB (ref 12.0–15.0)
Hemoglobin: 9.4 g/dL — ABNORMAL LOW (ref 12.0–15.0)
LYMPHS ABS: 3 10*3/uL (ref 0.7–4.0)
LYMPHS ABS: 0.2 10*3/uL — AB (ref 0.7–4.0)
LYMPHS PCT: 18 %
LYMPHS PCT: 2 %
MCH: 26.5 pg (ref 26.0–34.0)
MCH: 26.7 pg (ref 26.0–34.0)
MCHC: 31.2 g/dL (ref 30.0–36.0)
MCHC: 31.1 g/dL (ref 30.0–36.0)
MCV: 85.1 fL (ref 80.0–100.0)
MCV: 85.8 fL (ref 80.0–100.0)
METAMYELOCYTES PCT: 1 %
MONOS PCT: 3 %
Metamyelocytes Relative: 4 %
Monocytes Absolute: 0.3 10*3/uL (ref 0.1–1.0)
Monocytes Absolute: 0.3 10*3/uL (ref 0.1–1.0)
Monocytes Relative: 2 %
Myelocytes: 2 %
Neutro Abs: 12 10*3/uL — ABNORMAL HIGH (ref 1.7–7.7)
Neutro Abs: 8.1 10*3/uL — ABNORMAL HIGH (ref 1.7–7.7)
Neutrophils Relative %: 72 %
Neutrophils Relative %: 89 %
Platelets: 229 10*3/uL (ref 150–400)
Platelets: 173 10*3/uL (ref 150–400)
RBC: 3.96 MIL/uL (ref 3.87–5.11)
RBC: 3.52 MIL/uL — ABNORMAL LOW (ref 3.87–5.11)
RDW: 20.4 % — ABNORMAL HIGH (ref 11.5–15.5)
RDW: 20.2 % — ABNORMAL HIGH (ref 11.5–15.5)
WBC: 16.4 10*3/uL — ABNORMAL HIGH (ref 4.0–10.5)
WBC: 9.1 10*3/uL (ref 4.0–10.5)
nRBC: 1.9 % — ABNORMAL HIGH (ref 0.0–0.2)
nRBC: 0.8 % — ABNORMAL HIGH (ref 0.0–0.2)
nRBC: 1 /100 WBC — ABNORMAL HIGH

## 2018-07-21 LAB — BLOOD GAS, ARTERIAL
Acid-base deficit: 1.7 mmol/L (ref 0.0–2.0)
Bicarbonate: 22 mmol/L (ref 20.0–28.0)
DRAWN BY: 511911
FIO2: 40
MECHVT: 350 mL
O2 SAT: 97.8 %
PEEP: 5 cmH2O
Patient temperature: 100
RATE: 22 resp/min
pCO2 arterial: 34.7 mmHg (ref 32.0–48.0)
pH, Arterial: 7.421 (ref 7.350–7.450)
pO2, Arterial: 119 mmHg — ABNORMAL HIGH (ref 83.0–108.0)

## 2018-07-21 LAB — PATHOLOGIST SMEAR REVIEW

## 2018-07-21 LAB — URINE CULTURE: Culture: >=100000 — AB

## 2018-07-21 LAB — PHOSPHORUS: Phosphorus: 2 mg/dL — ABNORMAL LOW (ref 2.5–4.6)

## 2018-07-21 LAB — CHOLESTEROL, BODY FLUID: Cholesterol, Fluid: 39 mg/dL

## 2018-07-21 LAB — ADENOSIDE DEAMINASE, PLEURAL FL: ADENOSIDE DEAMINASE, PLEURAL FL: 8.7 U/L (ref 0.0–9.4)

## 2018-07-21 MED ORDER — VANCOMYCIN HCL 10 G IV SOLR
2000.0000 mg | Freq: Once | INTRAVENOUS | Status: AC
  Administered 2018-07-21: 2000 mg via INTRAVENOUS
  Filled 2018-07-21: qty 2000

## 2018-07-21 MED ORDER — SODIUM CHLORIDE 0.9 % IV SOLN
INTRAVENOUS | Status: DC | PRN
  Administered 2018-07-21 – 2018-07-24 (×2): 250 mL via INTRAVENOUS
  Administered 2018-07-25: 500 mL via INTRAVENOUS

## 2018-07-21 MED ORDER — CLONAZEPAM 1 MG PO TABS: 1.0000 mg | ORAL_TABLET | Freq: Two times a day (BID) | ORAL | Status: DC

## 2018-07-21 MED ORDER — VITAL AF 1.2 CAL PO LIQD
1000.0000 mL | ORAL | Status: DC
  Administered 2018-07-22 – 2018-07-28 (×9): 1000 mL
  Filled 2018-07-21 (×5): qty 1000

## 2018-07-21 MED ORDER — SODIUM PHOSPHATES 45 MMOLE/15ML IV SOLN
10.0000 mmol | Freq: Once | INTRAVENOUS | Status: AC
  Administered 2018-07-21: 10 mmol via INTRAVENOUS
  Filled 2018-07-21: qty 3.33

## 2018-07-21 MED ORDER — IBUPROFEN 100 MG/5ML PO SUSP
600.0000 mg | Freq: Three times a day (TID) | ORAL | Status: DC | PRN
  Administered 2018-07-21: 600 mg
  Filled 2018-07-21: qty 30

## 2018-07-21 MED ORDER — POTASSIUM PHOSPHATES 15 MMOLE/5ML IV SOLN
20.0000 mmol | Freq: Once | INTRAVENOUS | Status: AC
  Administered 2018-07-21: 20 mmol via INTRAVENOUS
  Filled 2018-07-21: qty 6.67

## 2018-07-21 MED ORDER — QUETIAPINE FUMARATE 100 MG PO TABS
100.0000 mg | ORAL_TABLET | Freq: Two times a day (BID) | ORAL | Status: DC
  Administered 2018-07-21 – 2018-07-25 (×9): 100 mg via ORAL
  Filled 2018-07-21 (×9): qty 1

## 2018-07-21 MED ORDER — VANCOMYCIN HCL 10 G IV SOLR
1500.0000 mg | INTRAVENOUS | Status: DC
  Administered 2018-07-22 – 2018-07-25 (×4): 1500 mg via INTRAVENOUS
  Filled 2018-07-21 (×4): qty 1500

## 2018-07-21 MED ORDER — FUROSEMIDE 10 MG/ML IJ SOLN
40.0000 mg | Freq: Once | INTRAMUSCULAR | Status: AC
  Administered 2018-07-21: 40 mg via INTRAVENOUS
  Filled 2018-07-21: qty 4

## 2018-07-21 MED ORDER — SODIUM CHLORIDE 0.9 % IV SOLN
2.0000 g | Freq: Three times a day (TID) | INTRAVENOUS | Status: DC
  Administered 2018-07-21 – 2018-07-25 (×12): 2 g via INTRAVENOUS
  Filled 2018-07-21 (×14): qty 2

## 2018-07-21 NOTE — Progress Notes (Signed)
Nutrition Follow-up  DOCUMENTATION CODES:   Not applicable  INTERVENTION:   Tube Feeding:  Increase Vital AF 1.2 to 65 ml/hr Provides 117 g of protein, 1872 kcals, 1264 mL of free water Meets 100% protein needs, 96% calorie needs   NUTRITION DIAGNOSIS:   Inadequate oral intake related to inability to eat as evidenced by NPO status.  Being addressed via TF   GOAL:   Patient will meet greater than or equal to 90% of their needs  Met  MONITOR:   Vent status, Labs, Weight trends, TF tolerance, Skin, I & O's  REASON FOR ASSESSMENT:   Ventilator, Consult Enteral/tube feeding initiation and management  ASSESSMENT:   73 yr old female w/ PMHx of NHL, HLD, HTN, Hypothyroidism, LBBB, CAD G1DD presented from home via EMS after coughing up blood and feeling weak, Tested flu positive at her PMD visit on 07/14/2018.  2/07 Intubated, Thoracentesis, Bronch 2/11 Left chest tube placed, Febrile  Patient is currently intubated on ventilator support, fentanyl and precedex MV: 12.9 L/min Temp (24hrs), Avg:100.8 F (38.2 C), Min:99.7 F (37.6 C), Max:102.3 F (39.1 C)  Vital AF 1.2 @ 55 ml/hr, free water 200 mL q 4 hours  Current wt 81.4 kg; admission wt of 74 kg. Net + 6 L per I/O flow sheet with edema present on exam. Utilizing 74 kg as dry wt  Labs: phosphorus 2.0 (L), elevated LFTs Meds: D5 at 50 ml/hr, ss novolog   Diet Order:   Diet Order            Diet NPO time specified  Diet effective now              EDUCATION NEEDS:   Education needs have been addressed  Skin:  Skin Assessment: Reviewed RN Assessment  Last BM:  07/16/18  Height:   Ht Readings from Last 1 Encounters:  07/16/18 5' 5"  (1.651 m)    Weight:   Wt Readings from Last 1 Encounters:  07/21/18 81.4 kg    Ideal Body Weight:  56.8 kg  BMI:  Body mass index is 29.86 kg/m.  Estimated Nutritional Needs:   Kcal:  1932 kcals   Protein:  110-145 g  Fluid:  >/= 1.9 L   Kerman Passey MS,  RD, LDN, CNSC [phone] Pager  [phone] Weekend/On-Call Pager

## 2018-07-21 NOTE — Progress Notes (Addendum)
Pharmacy Antibiotic Note  Kristin Moon is a 73 y.o. female admitted on 07/22/2018 with fluB and S. pneumoniae pneumona treated with Tamiflu and cefepime>>ceftriaxone. She has continued to have elevated temperatures, up to 104.2. Note patient has bilateral pleural effusions s/p L chest tube placement yesterday, and LFTs are suddenly elevated though improved today. Considering acalculous cholecystitis, abdominal US read pending. Pharmacy has been consulted for vancomycin and cefepime dosing. WBC up 16.4, temp currently 102.3. Scr improved to 0.77, estimated CrCl ~69 mL/min.  Plan: Vancomycin 2g IV x1, then 1536m IV q24h (estimated AUC 480, goal 400-550) Cefepime 2 g IV q8h F/u clinical status, repeat C&S, renal function, de-escalation, LOT, vancomycin levels as appropriate  Height: _0  (165.1 cm) Weight: 179 lb 7.3 oz (81.4 kg) IBW/kg (Calculated) : 57  Temp (24hrs), Avg:101.4 F (38.6 C), Min:99.7 F (37.6 C), Max:103.3 F (39.6 C)  Recent Labs  Lab 07/31/2018 1912 07/16/18 0224 07/16/18 0348  07/17/18 0515 07/17/18 0844  07/18/18 0431  07/19/18 0534 07/19/18 1927 07/20/18 0450 07/20/18 1700 07/21/18 0443  WBC 8.5 8.5  --    < > 16.2*  --    < > 8.6   < > 8.7 9.6 10.1 10.2 16.4*  CREATININE 1.52* 0.80  --    < > 1.07*  --   --  0.95  --  0.95  --  1.35*  --  0.77  LATICACIDVEN 1.6 2.5* 2.3*  --   --  1.9  --   --   --   --   --   --   --   --    < > = values in this interval not displayed.    Estimated Creatinine Clearance: 69 mL/min (by C-G formula based on SCr of 0.77 mg/dL).    Allergies  Allergen Reactions  . Metoprolol     fatigue   Antimicrobials this admission: Vancomycin 2/6 >> 2/10; 2/12 >> Cefepime 2/6 >> 2/10; 2/12 >> Metronidazole 2/6 >> 2/7 Tamiflu 2/5 (OP) >> 2/10 CTX 2/10 >> 2/12  Microbiology results: 2/6 BCx: NG x3 2/7 RVP: fluB+ 2/7 MRSA PCR: neg 2/7 S. pneumo antigen: positive 2/7 BAL: NG final 2/7 pleural cx: normal flora 2/11 pleural cx:  NG < 24h 2/11 Bcx: NG < 24h 2/11 TA cx: pending 2/11 Ucx: collected  Thank you for allowing pharmacy to be a part of this patient's care.  EMila MerryDGerarda Fraction PharmD, BWeatherby LakePGY2 Infectious Diseases Pharmacy Resident Phone: [phone]/05/2019 10:40 AM

## 2018-07-21 NOTE — Progress Notes (Signed)
eLink Physician-Brief Progress Note Patient Name: Kristin Moon DOB: December 26, 1945 MRN: [id number]   Date of Service  07/21/2018  HPI/Events of Note  Notified of tachypnea that would improve with Fentanyl. TV 350 RR 22 PEEP 5, peak pressure 8. CXR no pneumothorax with decreased pleural effusion  eICU Interventions  Get ABG     Intervention Category Major Interventions: Respiratory failure - evaluation and management  Kristin Moon 07/21/2018, 12:22 AM

## 2018-07-21 NOTE — Progress Notes (Addendum)
..   NAME:  Kristin Moon, MRN:  [id number], DOB:  02-11-1946, LOS: 77 ADMISSION DATE:  07/17/2018, CONSULTATION DATE:  07/16/2018 REFERRING MD:  [facility] Hospitalist, CHIEF COMPLAINT:  SOB and fever   Brief History    73 yr old female w/ PMHx of NHL, HLD, HTN, Hypothyroidism, LBBB, CAD G1DD presented from home via EMS after coughing up blood and feeling weak, Tested flu positive at her PMD visit on 07/14/2018 and started on Tamiflu as outpatient but continued to worsen Failed BiPAP treatment in ED, intubated for acute respiratory failure.  Past Medical History  NHL (per son last radiation Rx was 79 yrs ago), HLD, HTN, Hypothyroidism, LBBB, CAD, G1DD (EF 50%) w/ a history of pneumonia last summer/fall not requiring hospitalization.   Significant Hospital Events   2/7 Admit with flu infection, pneumococcus PNA, intubated 2/10: CT chest ordered to further evaluate pleural space.  Findings consistent with large left and small to moderate right effusion 2/11: Spiking temperature of 104, cultures resent.  Left chest tube placed.  LFTs significantly elevated, ultrasound ordered.  Ultrasound abdomen negative for acute cholecystitis.   2/12: Still having fevers.  Antibiotics widened, added vancomycin and cefepime.  Discontinued fentanyl drip  Consults:  PCCM  Procedures:  Endotracheal intubation- 07/16/2018 >> CVC insertion - 07/16/2018 Thoracentesis 2/7 Bronch, BAL 2/7 Left chest tube 2/11>>  Significant Diagnostic Tests:  Chest x-ray 2/9- slight improvement in dense left lung consolidation.  I have reviewed the images personally. CT chest 2/10: Bibasilar left greater than right pneumonia/consolidation with moderate to large left effusion and small to moderate right effusion. Right upper quadrant ultrasound 2/11>>> some sludge, no significant distention.  No pericholecystic fluid Micro Data: ( pending)  Blood cx x 2--> negative resp cx--> neg RVP--> Flu B Influenza swab--> Flu B U pneumococcus 2/7 >  positive Blood 2/11>> Urine 2/11>>> Sputum 2/11>>> Left pleural fluid 2/11>>>  Antimicrobials:  Vancomycin: 2/6 > 2/7 Cefepime: 2/6 >> 2/10  Ceftriaxone 2/10 >>> 2/12 Tamiflu 2/5 >> completed 5 days Cefepime 2/12>>> Vancomycin 2/12>>>  Subjective   Spiking fever Objective   Blood pressure (Abnormal) 136/50, pulse 99, temperature (Abnormal) 100.4 F (38 C), resp. rate (Abnormal) 31, height _0  (1.651 m), weight 81.4 kg, SpO2 100 %. CVP:  [9 mmHg-15 mmHg] 10 mmHg  Vent Mode: CPAP;PSV FiO2 (%):  [40 %] 40 % Set Rate:  [22 bmp] 22 bmp Vt Set:  [350 mL] 350 mL PEEP:  [5 cmH20] 5 cmH20 Pressure Support:  [5 cmH20] 5 cmH20 Plateau Pressure:  [9 cmH20] 9 cmH20   Intake/Output Summary (Last 24 hours) at 07/21/2018 1145 Last data filed at 07/21/2018 0800 Gross per 24 hour  Intake 2332.86 ml  Output 2300 ml  Net 32.86 ml   Filed Weights   07/19/18 0500 07/20/18 0500 07/21/18 0500  Weight: 75 kg 80.5 kg 81.4 kg   Examination: General: General: Sedated on full ventilator support HEENT: Normocephalic atraumatic no jugular venous distention mucous membranes moist orally intubated Pulmonary: Diminished bilaterally.  Left chest tube is draining 1400 cc of serous appearing cloudy fluid since insertion on 2/11 no air leak appreciated Cardiac: Regular rate and rhythm Abdomen: Soft nontender Extremities: Warm dry no significant edema Neuro: Sedated.  Gets agitated easily.  No focal motor deficits appreciated    Septic shock Anion gap metabolic acidosis  AKI  Mild NAG metabolic acidosis 2/2 hyperchloremia from NaCl Hyperkalemia   Assessment & Plan:  73 year old with acute respiratory failure in the setting of flu  B infection, Streptococcus pneumonia  Respiratory failure 2/2 Flu, Streptococcus pneumonia and left exudative effusion Portable chest x-ray personally reviewed: Improved aeration bilaterally specifically on side of chest tube.  Continues to have bilateral pneumonia.   Aeration and perhaps a little improved.  Support tubes and lines in satisfactory position -She is weaning, however gets tachypneic and developed significant accessory use after she is stimulated.  Currently this is a barrier to extubation Plan Continuing full ventilator support with pressure support as tolerated  Keep left chest tube in place at 20 cm suction  Follow-up a.m. chest x-ray  Widening antibiotics as below  Holding Lasix  Continue VAP bundle  Addressing sedation regimen to achieve optimal RASS   Fever , with leukocytosis Plan Panculture sent on 2/11 including blood, sputum, pleural fluid and urine Will add nosocomial coverage today including vancomycin and cefepime We will place PICC line and DC central line See if we can get her urinary catheter out today   Abnormal LFTs. RUQ [place] w/ some sludge but no stones of pericholecystic fluid.  -lfts trending down Plan Hold apap Trend LFTs  Fluid and electrolyte imbalance: hypernatremia, Plan Cont free water Holding lasix Trend cmp   Acute metabolic encephalopathy Plan Dc fent gtt soon  Cont precedex Add seroquel and klonopin  Cont  Hypothyroidism, elevated blood sugar Plan Cont ssi and synthroid    Anemia of critical illness Plan Trend cbc  Best practice:  Diet: Tube feeds Pain/Anxiety/Delirium protocol (if indicated): continuous fentanyl with prn versed VAP protocol (if indicated): yes DVT prophylaxis: Hep [place] and SCDs GI prophylaxis:  Pepcid IV Glucose control: SSI Mobility: continuous bedrest Code Status: FULL Family Communication: Family updated daily. Disposition she remains critically ill requiring titration of mechanical ventilation, antimicrobial support, titration of sedating medications, and close observation.Marland Kitchen    Erick Colace ACNP-BC [place] Pager # [phone] OR # [phone] if no answer

## 2018-07-22 ENCOUNTER — Inpatient Hospital Stay: Payer: Self-pay

## 2018-07-22 ENCOUNTER — Inpatient Hospital Stay (HOSPITAL_COMMUNITY): Payer: Medicare Other

## 2018-07-22 LAB — CBC WITH DIFFERENTIAL/PLATELET
Abs Immature Granulocytes: 0.1 10*3/uL — ABNORMAL HIGH (ref 0.00–0.07)
Basophils Absolute: 0 10*3/uL (ref 0.0–0.1)
Basophils Relative: 0 %
Eosinophils Absolute: 0.2 10*3/uL (ref 0.0–0.5)
Eosinophils Relative: 2 %
HCT: 31.2 % — ABNORMAL LOW (ref 36.0–46.0)
Hemoglobin: 9.5 g/dL — ABNORMAL LOW (ref 12.0–15.0)
Lymphocytes Relative: 9 %
Lymphs Abs: 0.8 10*3/uL (ref 0.7–4.0)
MCH: 26.2 pg (ref 26.0–34.0)
MCHC: 30.4 g/dL (ref 30.0–36.0)
MCV: 86 fL (ref 80.0–100.0)
Metamyelocytes Relative: 1 %
Monocytes Absolute: 0.1 10*3/uL (ref 0.1–1.0)
Monocytes Relative: 1 %
Neutro Abs: 7.3 10*3/uL (ref 1.7–7.7)
Neutrophils Relative %: 87 %
Platelets: 163 10*3/uL (ref 150–400)
RBC: 3.63 MIL/uL — ABNORMAL LOW (ref 3.87–5.11)
RDW: 19.8 % — ABNORMAL HIGH (ref 11.5–15.5)
WBC: 8.4 10*3/uL (ref 4.0–10.5)
nRBC: 1.1 % — ABNORMAL HIGH (ref 0.0–0.2)
nRBC: 3 /100 WBC — ABNORMAL HIGH

## 2018-07-22 LAB — CULTURE, RESPIRATORY

## 2018-07-22 LAB — POCT I-STAT 7, (LYTES, BLD GAS, ICA,H+H)
Acid-Base Excess: 3 mmol/L — ABNORMAL HIGH (ref 0.0–2.0)
Acid-base deficit: 1 mmol/L (ref 0.0–2.0)
Bicarbonate: 23.3 mmol/L (ref 20.0–28.0)
Bicarbonate: 27.5 mmol/L (ref 20.0–28.0)
Calcium, Ion: 1.32 mmol/L (ref 1.15–1.40)
Calcium, Ion: 1.36 mmol/L (ref 1.15–1.40)
HCT: 23 % — ABNORMAL LOW (ref 36.0–46.0)
HCT: 31 % — ABNORMAL LOW (ref 36.0–46.0)
Hemoglobin: 7.8 g/dL — ABNORMAL LOW (ref 12.0–15.0)
Hemoglobin: 10.5 g/dL — ABNORMAL LOW (ref 12.0–15.0)
O2 SAT: 99 %
O2 Saturation: 99 %
Potassium: 4 mmol/L (ref 3.5–5.1)
Potassium: 3.5 mmol/L (ref 3.5–5.1)
Sodium: 141 mmol/L (ref 135–145)
Sodium: 141 mmol/L (ref 135–145)
TCO2: 24 mmol/L (ref 22–32)
TCO2: 29 mmol/L (ref 22–32)
pCO2 arterial: 36.7 mmHg (ref 32.0–48.0)
pCO2 arterial: 39.3 mmHg (ref 32.0–48.0)
pH, Arterial: 7.41 (ref 7.350–7.450)
pH, Arterial: 7.453 — ABNORMAL HIGH (ref 7.350–7.450)
pO2, Arterial: 154 mmHg — ABNORMAL HIGH (ref 83.0–108.0)
pO2, Arterial: 130 mmHg — ABNORMAL HIGH (ref 83.0–108.0)

## 2018-07-22 LAB — COMPREHENSIVE METABOLIC PANEL
ALT: 1024 U/L — ABNORMAL HIGH (ref 0–44)
AST: 379 U/L — ABNORMAL HIGH (ref 15–41)
Albumin: 1.6 g/dL — ABNORMAL LOW (ref 3.5–5.0)
Alkaline Phosphatase: 124 U/L (ref 38–126)
Anion gap: 8 (ref 5–15)
BUN: 28 mg/dL — ABNORMAL HIGH (ref 8–23)
CO2: 22 mmol/L (ref 22–32)
Calcium: 8.6 mg/dL — ABNORMAL LOW (ref 8.9–10.3)
Chloride: 112 mmol/L — ABNORMAL HIGH (ref 98–111)
Creatinine, Ser: 0.61 mg/dL (ref 0.44–1.00)
GFR calc Af Amer: >60 mL/min (ref >60)
GFR calc non Af Amer: >60 mL/min (ref >60)
GLUCOSE: 258 mg/dL — AB (ref 70–99)
Potassium: 4 mmol/L (ref 3.5–5.1)
Sodium: 142 mmol/L (ref 135–145)
TOTAL PROTEIN: 5.1 g/dL — AB (ref 6.5–8.1)
Total Bilirubin: 0.7 mg/dL (ref 0.3–1.2)

## 2018-07-22 LAB — TROPONIN I
Troponin I: 0.05 ng/mL (ref <0.03)
Troponin I: 0.06 ng/mL (ref <0.03)

## 2018-07-22 LAB — CULTURE, RESPIRATORY W GRAM STAIN: Culture: NORMAL

## 2018-07-22 LAB — PHOSPHORUS: Phosphorus: 2.9 mg/dL (ref 2.5–4.6)

## 2018-07-22 LAB — GLUCOSE, CAPILLARY
Glucose-Capillary: 221 mg/dL — ABNORMAL HIGH (ref 70–99)
Glucose-Capillary: 220 mg/dL — ABNORMAL HIGH (ref 70–99)
Glucose-Capillary: 253 mg/dL — ABNORMAL HIGH (ref 70–99)
Glucose-Capillary: 235 mg/dL — ABNORMAL HIGH (ref 70–99)
Glucose-Capillary: 224 mg/dL — ABNORMAL HIGH (ref 70–99)
Glucose-Capillary: 200 mg/dL — ABNORMAL HIGH (ref 70–99)

## 2018-07-22 LAB — POCT ACTIVATED CLOTTING TIME: Activated Clotting Time: 0 seconds

## 2018-07-22 MED ORDER — CLONAZEPAM 0.1 MG/ML ORAL SUSPENSION
0.5000 mg | Freq: Two times a day (BID) | ORAL | Status: DC
  Filled 2018-07-22: qty 5

## 2018-07-22 MED ORDER — SODIUM CHLORIDE 0.9 % IV BOLUS
500.0000 mL | Freq: Once | INTRAVENOUS | Status: AC
  Administered 2018-07-22: 500 mL via INTRAVENOUS

## 2018-07-22 MED ORDER — MIDAZOLAM HCL 2 MG/2ML IJ SOLN
1.0000 mg | INTRAMUSCULAR | Status: DC | PRN
  Administered 2018-07-22 – 2018-07-25 (×5): 2 mg via INTRAVENOUS
  Filled 2018-07-22 (×6): qty 2

## 2018-07-22 MED ORDER — SODIUM CHLORIDE 0.9% FLUSH: 10.0000 mL | INTRAVENOUS | Status: DC | PRN

## 2018-07-22 MED ORDER — INSULIN ASPART 100 UNIT/ML ~~LOC~~ SOLN
4.0000 [IU] | SUBCUTANEOUS | Status: DC
  Administered 2018-07-22 – 2018-07-29 (×41): 4 [IU] via SUBCUTANEOUS

## 2018-07-22 MED ORDER — FUROSEMIDE 10 MG/ML IJ SOLN
40.0000 mg | Freq: Two times a day (BID) | INTRAMUSCULAR | Status: DC
  Administered 2018-07-22: 40 mg via INTRAVENOUS
  Filled 2018-07-22: qty 4

## 2018-07-22 MED ORDER — DEXMEDETOMIDINE HCL IN NACL 400 MCG/100ML IV SOLN
0.0000 ug/kg/h | INTRAVENOUS | Status: AC
  Administered 2018-07-22 (×3): 1.2 ug/kg/h via INTRAVENOUS
  Administered 2018-07-23 (×2): 0.8 ug/kg/h via INTRAVENOUS
  Administered 2018-07-23: 1.2 ug/kg/h via INTRAVENOUS
  Administered 2018-07-24 (×2): 0.8 ug/kg/h via INTRAVENOUS
  Administered 2018-07-24 – 2018-07-25 (×2): 0.7 ug/kg/h via INTRAVENOUS
  Filled 2018-07-22 (×11): qty 100

## 2018-07-22 MED ORDER — DILTIAZEM HCL-DEXTROSE 100-5 MG/100ML-% IV SOLN (PREMIX)
5.0000 mg/h | INTRAVENOUS | Status: DC
  Administered 2018-07-22: 5 mg/h via INTRAVENOUS
  Filled 2018-07-22 (×2): qty 100

## 2018-07-22 MED ORDER — CHLORHEXIDINE GLUCONATE CLOTH 2 % EX PADS
6.0000 | MEDICATED_PAD | Freq: Every day | CUTANEOUS | Status: DC
  Administered 2018-07-23 – 2018-07-28 (×5): 6 via TOPICAL

## 2018-07-22 MED ORDER — SODIUM CHLORIDE 0.9% FLUSH
10.0000 mL | Freq: Two times a day (BID) | INTRAVENOUS | Status: DC
  Administered 2018-07-22 – 2018-07-28 (×8): 10 mL
  Administered 2018-07-28: 20 mL
  Administered 2018-07-29: 10 mL

## 2018-07-22 MED ORDER — FENTANYL CITRATE (PF) 100 MCG/2ML IJ SOLN
50.0000 ug | INTRAMUSCULAR | Status: DC | PRN
  Administered 2018-07-22 – 2018-07-29 (×6): 50 ug via INTRAVENOUS
  Filled 2018-07-22 (×7): qty 2

## 2018-07-22 MED ORDER — MIDAZOLAM HCL (PF) 5 MG/ML IJ SOLN: 1.0000 mg | INTRAMUSCULAR | Status: DC | PRN

## 2018-07-22 MED ORDER — FENTANYL BOLUS VIA INFUSION
25.0000 ug | INTRAVENOUS | Status: DC | PRN
  Administered 2018-07-22: 25 ug via INTRAVENOUS
  Filled 2018-07-22: qty 25

## 2018-07-22 MED ORDER — FUROSEMIDE 10 MG/ML IJ SOLN
60.0000 mg | Freq: Three times a day (TID) | INTRAMUSCULAR | Status: DC
  Administered 2018-07-22 – 2018-07-23 (×3): 60 mg via INTRAVENOUS
  Filled 2018-07-22 (×3): qty 6

## 2018-07-22 MED ORDER — CLONAZEPAM 0.5 MG PO TBDP
0.5000 mg | ORAL_TABLET | Freq: Two times a day (BID) | ORAL | Status: DC
  Administered 2018-07-22 – 2018-07-24 (×5): 0.5 mg
  Filled 2018-07-22 (×5): qty 1

## 2018-07-22 MED ORDER — FREE WATER
200.0000 mL | Freq: Four times a day (QID) | Status: DC
  Administered 2018-07-23 – 2018-07-24 (×5): 200 mL

## 2018-07-22 MED ORDER — FENTANYL CITRATE (PF) 100 MCG/2ML IJ SOLN: 50.0000 ug | INTRAMUSCULAR | Status: DC | PRN

## 2018-07-22 MED ORDER — FENTANYL 2500MCG IN NS 250ML (10MCG/ML) PREMIX INFUSION
25.0000 ug/h | INTRAVENOUS | Status: DC
  Administered 2018-07-22: 50 ug/h via INTRAVENOUS
  Administered 2018-07-23 – 2018-07-25 (×3): 25 ug/h via INTRAVENOUS
  Filled 2018-07-22 (×3): qty 250

## 2018-07-22 NOTE — Progress Notes (Signed)
eLink Physician-Brief Progress Note Patient Name: Kristin Moon DOB: December 02, 1945 MRN: [id number]   Date of Service  07/22/2018  HPI/Events of Note  Patient developed oliguria with a CVP of 5.  eICU Interventions  Ordered 500 mL of normal second bolus.     Intervention Category Intermediate Interventions: Medication change / dose adjustment;Oliguria - evaluation and management  Mady Gemma 07/22/2018, 2:48 AM

## 2018-07-22 NOTE — Progress Notes (Signed)
Peripherally Inserted Central Catheter/Midline Placement  The IV Nurse has discussed with the patient and/or persons authorized to consent for the patient, the purpose of this procedure and the potential benefits and risks involved with this procedure.  The benefits include less needle sticks, lab draws from the catheter, and the patient may be discharged home with the catheter. Risks include, but not limited to, infection, bleeding, blood clot (thrombus formation), and puncture of an artery; nerve damage and irregular heartbeat and possibility to perform a PICC exchange if needed/ordered by physician.  Alternatives to this procedure were also discussed.  Bard Power PICC patient education guide, fact sheet on infection prevention and patient information card has been provided to patient /or left at bedside.    PICC/Midline Placement Documentation  PICC Double Lumen 07/22/18 PICC Right Brachial 38 cm 0 cm (Active)  Indication for Insertion or Continuance of Line Prolonged intravenous therapies 07/22/2018  6:30 PM  Exposed Catheter (cm) 1 cm 07/22/2018  6:30 PM  Site Assessment Clean;Dry;Intact 07/22/2018  6:30 PM  Lumen #1 Status Flushed;Blood return noted;Saline locked 07/22/2018  6:30 PM  Lumen #2 Status Flushed;Blood return noted;Saline locked 07/22/2018  6:30 PM  Dressing Type Transparent 07/22/2018  6:30 PM  Dressing Status Clean;Dry;Intact 07/22/2018  6:30 PM  Dressing Change Due 08-09-18 07/22/2018  6:30 PM       [address] 07/22/2018, 6:32 PM

## 2018-07-22 NOTE — Progress Notes (Signed)
..   NAME:  Kristin Moon, MRN:  [id number], DOB:  [birth year], LOS: 7 ADMISSION DATE:  07/22/2018, CONSULTATION DATE:  07/16/2018 REFERRING MD:  [facility] Hospitalist, CHIEF COMPLAINT:  SOB and fever   Brief History    73 yr old female w/ PMHx of NHL, HLD, HTN, Hypothyroidism, LBBB, CAD G1DD presented from home via EMS after coughing up blood and feeling weak, Tested flu positive at her PMD visit on 07/14/2018 and started on Tamiflu as outpatient but continued to worsen Failed BiPAP treatment in ED, intubated for acute respiratory failure.  Past Medical History  NHL (per son last radiation Rx was 31 yrs ago), HLD, HTN, Hypothyroidism, LBBB, CAD, G1DD (EF 50%) w/ a history of pneumonia last summer/fall not requiring hospitalization.   Significant Hospital Events   2/7 Admit with flu infection, pneumococcus PNA, intubated 2/10: CT chest ordered to further evaluate pleural space.  Findings consistent with large left and small to moderate right effusion 2/11: Spiking temperature of 104, cultures resent.  Left chest tube placed.  LFTs significantly elevated, ultrasound ordered.  Ultrasound abdomen negative for acute cholecystitis.   2/12: Still having fevers.  Antibiotics widened, added vancomycin and cefepime.  Discontinued fentanyl drip 2/13: Fever curve is down.  White blood cell count has improved.  Culture still pending.  Continuing nosocomial coverage.  Working on addressing sedation regimen.  Tidal volumes are adequate on spontaneous breathing trial but agitation and deconditioning seem to be a major barrier at this point.  Consults:  PCCM  Procedures:  Endotracheal intubation- 07/16/2018 >> CVC insertion - 07/16/2018 Thoracentesis 2/7 Bronch, BAL 2/7 Left chest tube 2/11>>  Significant Diagnostic Tests:  Chest x-ray 2/9- slight improvement in dense left lung consolidation.  I have reviewed the images personally. CT chest 2/10: Bibasilar left greater than right pneumonia/consolidation with  moderate to large left effusion and small to moderate right effusion. Right upper quadrant ultrasound 2/11>>> some sludge, no significant distention.  No pericholecystic fluid Micro Data: ( pending)  Blood cx x 2--> negative resp cx--> neg RVP--> Flu B Influenza swab--> Flu B U pneumococcus 2/7 > positive Blood 2/11>> Urine 2/11>>> Sputum 2/11>>> Left pleural fluid 2/11>>>  Antimicrobials:  Vancomycin: 2/6 > 2/7 Cefepime: 2/6 >> 2/10  Ceftriaxone 2/10 >>> 2/12 Tamiflu 2/5 >> completed 5 days Cefepime 2/12>>> Vancomycin 2/12>>>  Subjective   Spiking fever Objective   Blood pressure (Abnormal) 153/59, pulse (Abnormal) 113, temperature 97.9 F (36.6 C), temperature source Axillary, resp. rate (Abnormal) 32, height _0  (1.575 m), weight 82.5 kg, SpO2 100 %. CVP:  [4 mmHg-10 mmHg] 6 mmHg  Vent Mode: PSV;CPAP FiO2 (%):  [40 %] 40 % Set Rate:  [22 bmp] 22 bmp Vt Set:  [350 mL] 350 mL PEEP:  [5 cmH20] 5 cmH20 Pressure Support:  [5 cmH20] 5 cmH20   Intake/Output Summary (Last 24 hours) at 07/22/2018 1139 Last data filed at 07/22/2018 1100 Gross per 24 hour  Intake 2299.21 ml  Output 2460 ml  Net -160.79 ml   Filed Weights   07/20/18 0500 07/21/18 0500 07/22/18 0442  Weight: 80.5 kg 81.4 kg 82.5 kg   Examination: General: This is a debilitated 73 year old white female currently sedated on ventilator HEENT normocephalic atraumatic orally intubated no jugular venous distention Pulmonary: Diminished throughout, becomes tachypneic with verbal stimulus with accessory use associated with this.  There is no air leak in the left chest tube and output is diminished Cardiac: Regular rate and rhythm Abdomen: Soft not tender no organomegaly Extremities:  Warm and dry brisk capillary refill Neuro: Will open eyes, but not follow commands other than that.    Septic shock Anion gap metabolic acidosis  AKI  Mild NAG metabolic acidosis 2/2 hyperchloremia from NaCl Hyperkalemia    Assessment & Plan:  73 year old with acute respiratory failure in the setting of flu B infection, Streptococcus pneumonia  Respiratory failure 2/2 Flu, Streptococcus pneumonia and left exudative effusion Portable chest x-ray personally reviewed: Improved aeration bilaterally specifically on side of chest tube.  Continues to have bilateral pneumonia.  Aeration and perhaps a little improved.  Support tubes and lines in satisfactory position -She is weaning, however gets tachypneic and developed significant accessory use after she is stimulated.  Currently this is a barrier to extubation Plan Continue full ventilator support No change in chest tube management keep at 20 cm water suction VAP bundle IV Lasix today Optimizing mental status RASS goal 0   Fever , with leukocytosis Plan Follow-up cultures Day #2 vancomycin and cefepime  Abnormal LFTs. RUQ [place] w/ some sludge but no stones of pericholecystic fluid.  -lfts trending down Plan Continue to trend LFTs Hold Tylenol  Fluid and electrolyte imbalance (intermittent) Plan Continue free water but changed to every 6 hours A.m. chemistry particularly with IV diuresis  Acute metabolic encephalopathy Plan Discontinue fentanyl drip today Continue Precedex Continue Seroquel Add clonazepam Add clonidine taper  Physical deconditioning Plan Will need extensive PT and Occupational Therapy after extubation I worry this may be a barrier to extubation  Hypothyroidism, elevated blood sugar Plan No change in Synthroid Add tube feed coverage for basal insulin dosing and continue sliding scale  Anemia of critical illness -Hemoglobin stable Plan Trending CBC  Best practice:  Diet: Tube feeds Pain/Anxiety/Delirium protocol (if indicated): continuous fentanyl with prn versed VAP protocol (if indicated): yes DVT prophylaxis: Hep [place] and SCDs GI prophylaxis:  Pepcid IV Glucose control: SSI Mobility: continuous bedrest Code Status:  FULL Family Communication: Family updated daily. Disposition remains critically ill due to need for titration of mechanical ventilation, optimization of volume status and titration of sedating drips  My critical care x32 minutes  Erick Colace ACNP-BC [place] Pager # [phone] OR # [phone] if no answer

## 2018-07-22 NOTE — Progress Notes (Signed)
eLink Physician-Brief Progress Note Patient Name: Kristin Moon DOB: Nov 06, 1945 MRN: [id number]   Date of Service  07/22/2018  HPI/Events of Note  Bed side asking for change free water from q4 to q6. And DC central line  eICU Interventions  Ok for above. Changed order     Intervention Category Minor Interventions: Routine modifications to care plan (e.g. PRN medications for pain, fever)  Elmer Sow 07/22/2018, 11:35 PM

## 2018-07-22 NOTE — Addendum Note (Signed)
Addended by: Allean Found on: 07/22/2018 09:09 AM   Modules accepted: Orders

## 2018-07-23 ENCOUNTER — Inpatient Hospital Stay (HOSPITAL_COMMUNITY): Payer: Medicare Other

## 2018-07-23 DIAGNOSIS — I480 Paroxysmal atrial fibrillation: Secondary | ICD-10-CM

## 2018-07-23 DIAGNOSIS — R7989 Other specified abnormal findings of blood chemistry: Secondary | ICD-10-CM

## 2018-07-23 DIAGNOSIS — I4891 Unspecified atrial fibrillation: Secondary | ICD-10-CM

## 2018-07-23 DIAGNOSIS — I5033 Acute on chronic diastolic (congestive) heart failure: Secondary | ICD-10-CM

## 2018-07-23 LAB — TROPONIN I
TROPONIN I: 0.1 ng/mL — AB (ref <0.03)
Troponin I: 0.1 ng/mL (ref <0.03)

## 2018-07-23 LAB — ECHOCARDIOGRAM COMPLETE
Height: 62 in
Weight: 2910.07 oz

## 2018-07-23 LAB — CBC
HCT: 30.7 % — ABNORMAL LOW (ref 36.0–46.0)
Hemoglobin: 9.6 g/dL — ABNORMAL LOW (ref 12.0–15.0)
MCH: 26.4 pg (ref 26.0–34.0)
MCHC: 31.3 g/dL (ref 30.0–36.0)
MCV: 84.6 fL (ref 80.0–100.0)
Platelets: 239 10*3/uL (ref 150–400)
RBC: 3.63 MIL/uL — ABNORMAL LOW (ref 3.87–5.11)
RDW: 20.1 % — ABNORMAL HIGH (ref 11.5–15.5)
WBC: 10.7 10*3/uL — AB (ref 4.0–10.5)
nRBC: 0.5 % — ABNORMAL HIGH (ref 0.0–0.2)

## 2018-07-23 LAB — COMPREHENSIVE METABOLIC PANEL
ALT: 649 U/L — ABNORMAL HIGH (ref 0–44)
AST: 139 U/L — ABNORMAL HIGH (ref 15–41)
Albumin: 1.6 g/dL — ABNORMAL LOW (ref 3.5–5.0)
Alkaline Phosphatase: 166 U/L — ABNORMAL HIGH (ref 38–126)
Anion gap: 9 (ref 5–15)
BUN: 30 mg/dL — ABNORMAL HIGH (ref 8–23)
CHLORIDE: 105 mmol/L (ref 98–111)
CO2: 28 mmol/L (ref 22–32)
Calcium: 9.4 mg/dL (ref 8.9–10.3)
Creatinine, Ser: 0.69 mg/dL (ref 0.44–1.00)
GFR calc Af Amer: >60 mL/min (ref >60)
GFR calc non Af Amer: >60 mL/min (ref >60)
Glucose, Bld: 183 mg/dL — ABNORMAL HIGH (ref 70–99)
POTASSIUM: 3.5 mmol/L (ref 3.5–5.1)
Sodium: 142 mmol/L (ref 135–145)
Total Bilirubin: 0.9 mg/dL (ref 0.3–1.2)
Total Protein: 5.2 g/dL — ABNORMAL LOW (ref 6.5–8.1)

## 2018-07-23 LAB — GLUCOSE, CAPILLARY
GLUCOSE-CAPILLARY: 89 mg/dL (ref 70–99)
GLUCOSE-CAPILLARY: 93 mg/dL (ref 70–99)
GLUCOSE-CAPILLARY: 132 mg/dL — AB (ref 70–99)
Glucose-Capillary: 193 mg/dL — ABNORMAL HIGH (ref 70–99)
Glucose-Capillary: 190 mg/dL — ABNORMAL HIGH (ref 70–99)
Glucose-Capillary: 134 mg/dL — ABNORMAL HIGH (ref 70–99)

## 2018-07-23 LAB — PHOSPHORUS: Phosphorus: 3.5 mg/dL (ref 2.5–4.6)

## 2018-07-23 LAB — MAGNESIUM: Magnesium: 1.7 mg/dL (ref 1.7–2.4)

## 2018-07-23 MED ORDER — MAGNESIUM SULFATE 2 GM/50ML IV SOLN
2.0000 g | Freq: Once | INTRAVENOUS | Status: AC
  Administered 2018-07-23: 2 g via INTRAVENOUS
  Filled 2018-07-23: qty 50

## 2018-07-23 MED ORDER — METOPROLOL TARTRATE 5 MG/5ML IV SOLN
5.0000 mg | Freq: Four times a day (QID) | INTRAVENOUS | Status: DC
  Administered 2018-07-23 – 2018-07-26 (×11): 5 mg via INTRAVENOUS
  Filled 2018-07-23 (×11): qty 5

## 2018-07-23 MED ORDER — VECURONIUM BROMIDE 10 MG IV SOLR
10.0000 mg | Freq: Once | INTRAVENOUS | Status: AC
  Administered 2018-07-23: 10 mg via INTRAVENOUS
  Filled 2018-07-23: qty 10

## 2018-07-23 MED ORDER — MORPHINE SULFATE (PF) 2 MG/ML IV SOLN: 1.0000 mg | INTRAVENOUS | Status: DC | PRN

## 2018-07-23 MED ORDER — FUROSEMIDE 10 MG/ML IJ SOLN
40.0000 mg | Freq: Two times a day (BID) | INTRAMUSCULAR | Status: DC
  Administered 2018-07-23 – 2018-07-24 (×2): 40 mg via INTRAVENOUS
  Filled 2018-07-23 (×2): qty 4

## 2018-07-23 MED ORDER — MORPHINE BOLUS VIA INFUSION: 1.0000 mg | INTRAVENOUS | Status: DC | PRN

## 2018-07-23 MED ORDER — LACTATED RINGERS IV BOLUS
250.0000 mL | Freq: Once | INTRAVENOUS | Status: AC
  Administered 2018-07-23: 250 mL via INTRAVENOUS

## 2018-07-23 MED ORDER — METOPROLOL TARTRATE 5 MG/5ML IV SOLN
5.0000 mg | Freq: Once | INTRAVENOUS | Status: AC
  Administered 2018-07-23: 5 mg via INTRAVENOUS
  Filled 2018-07-23: qty 5

## 2018-07-23 MED ORDER — MAGNESIUM SULFATE IN D5W 1-5 GM/100ML-% IV SOLN
1.0000 g | Freq: Once | INTRAVENOUS | Status: AC
  Administered 2018-07-23: 1 g via INTRAVENOUS
  Filled 2018-07-23: qty 100

## 2018-07-23 MED ORDER — FENTANYL CITRATE (PF) 100 MCG/2ML IJ SOLN
200.0000 ug | Freq: Once | INTRAMUSCULAR | Status: AC
  Administered 2018-07-23: 200 ug via INTRAVENOUS
  Filled 2018-07-23 (×2): qty 4

## 2018-07-23 MED ORDER — PROPOFOL 500 MG/50ML IV EMUL
INTRAVENOUS | Status: AC
  Filled 2018-07-23: qty 50

## 2018-07-23 MED ORDER — POTASSIUM CHLORIDE 10 MEQ/50ML IV SOLN
INTRAVENOUS | Status: AC
  Administered 2018-07-23: 10 meq via INTRAVENOUS
  Filled 2018-07-23: qty 50

## 2018-07-23 MED ORDER — POTASSIUM CHLORIDE 20 MEQ/15ML (10%) PO SOLN
20.0000 meq | Freq: Once | ORAL | Status: AC
  Administered 2018-07-23: 20 meq
  Filled 2018-07-23: qty 15

## 2018-07-23 MED ORDER — MIDAZOLAM HCL 2 MG/2ML IJ SOLN: 1.0000 mg | INTRAMUSCULAR | Status: DC | PRN

## 2018-07-23 MED ORDER — MIDAZOLAM HCL 2 MG/2ML IJ SOLN
5.0000 mg | Freq: Once | INTRAMUSCULAR | Status: AC
  Administered 2018-07-23: 2 mg via INTRAVENOUS
  Filled 2018-07-23: qty 6

## 2018-07-23 MED ORDER — POTASSIUM CHLORIDE 10 MEQ/50ML IV SOLN
10.0000 meq | INTRAVENOUS | Status: AC
  Administered 2018-07-23 (×4): 10 meq via INTRAVENOUS
  Filled 2018-07-23 (×3): qty 50

## 2018-07-23 MED ORDER — ETOMIDATE 2 MG/ML IV SOLN
40.0000 mg | Freq: Once | INTRAVENOUS | Status: AC
  Administered 2018-07-23: 20 mg via INTRAVENOUS
  Filled 2018-07-23: qty 20

## 2018-07-23 MED ORDER — FUROSEMIDE 10 MG/ML IJ SOLN
40.0000 mg | Freq: Once | INTRAMUSCULAR | Status: AC
  Administered 2018-07-23: 40 mg via INTRAVENOUS
  Filled 2018-07-23: qty 4

## 2018-07-23 MED ORDER — PROPOFOL 10 MG/ML IV BOLUS
500.0000 mg | Freq: Once | INTRAVENOUS | Status: DC
  Filled 2018-07-23: qty 60

## 2018-07-23 MED ORDER — POTASSIUM CHLORIDE 20 MEQ/15ML (10%) PO SOLN
40.0000 meq | Freq: Once | ORAL | Status: DC
  Filled 2018-07-23: qty 30

## 2018-07-23 NOTE — Progress Notes (Signed)
CRITICAL VALUE ALERT  Critical Value:  Troponin 0.10  Date & Time Notified:  07/23/2018 @ 04:22AM  Provider Notified: Hildred Priest, MD  Orders Received/Actions taken: Awaiting new orders. Will continue to monitor closely.  Clint Bolder, RN 07/23/18 4:25 AM

## 2018-07-23 NOTE — Progress Notes (Signed)
eLink Physician-Brief Progress Note Patient Name: Kristin Moon DOB: 03-10-1946 MRN: [id number]   Date of Service  07/23/2018  HPI/Events of Note  Troponin up to 0.10. from 0.06. K 3.5, mag 1.7  eICU Interventions  Trend troponin level. If going high, get Cardiology involved. K/mag replacement ordered Demand ischemia likely.      Intervention Category Intermediate Interventions: Diagnostic test evaluation;Electrolyte abnormality - evaluation and management  Elmer Sow 07/23/2018, 4:25 AM

## 2018-07-23 NOTE — Consult Note (Addendum)
Cardiology Consultation:   Patient ID: Kristin Moon MRN: [id number]; DOB: [birth year]  Admit date: 07/17/2018 Date of Consult: 07/23/2018  Primary Care Provider: Merrilee Seashore, MD Primary Cardiologist : Dr. Acie Fredrickson    Patient Profile:   Kristin Moon is a 73 y.o. female with a hx of chronic diastolic heart failure, hypertension, hyperlipidemia, left bundle branch block and minimal CAD by cardiac cath in 2012 who is being seen today for the evaluation of atrial fibrillation at the request of Dr. Chase Caller.  Most recent echocardiogram was from July, 2017 which revealed left ventricular ejection fraction of 50 to 55%.  She has grade 2 diastolic dysfunction.  There is moderate aortic insufficiency, mild mitral regurgitation.  She was doing well on cardiac standpoint when last seen by Dr. Acie Fredrickson 03/09/2018.  History of Present Illness:   Kristin Moon presented 2/6 with hemoptysis and not feeling well.  She was tested positive for flu and treated with Tamiflu>> but continued to get worse and remained febrile.  She failed BiPAP and intubated for respiratory failure secondary to flu and streptococcus pneumonia. 2/10 noted pleural effusion requiring chest tube placement 2/11 with fever spiking 104.  Yesterday 2/13 patient went into atrial fibrillation with rapid ventricular rate>> started on Cardizem drip with improved rate however become hypotensive.  Patient underwent bronchoscopy/ trace placement this morning.  Troponin flat 0.06>> 0.1>> 0.1.  Hemoglobin 9.6.  Potassium 3.5.  Albumin 1.6.  Alkaline phosphate 166.  Elevated LFTs.  Echo done, pending reading.  Her heart rate relatively stable in 90-100.  Blood pressure stable.  EKG 07/22/2018 atrial fibrillation at rate of 139 bpm  LBBB-personally reviewed. EKG 07/26/2018 sinus tachycardia.  LBBB rate of 116 bpm  Review of telemetry since patient currently in sinus rhythm at rate of 100 bpm  Patient currently intubated.  History obtained from  reviewing chart and family history.  Prior to presentation patient never had a palpitation, chest pain, orthopnea, PND, lower extremity edema or syncope.  She has chronic dyspnea on exertion which was stable.  Past Medical History:  Diagnosis Date  . Chronic diastolic CHF (congestive heart failure) ([place])   . Diastolic heart failure    Grade 1 per echo May 2012  . Hyperlipidemia   . Hypertension   . Hypothyroidism   . Left bundle branch block   . Non Hodgkin's lymphoma ([place])    history  . Non Hodgkin's lymphoma ([place])   . S/P right and left heart catheterization May 2012   minimal CAD. EF of 50%  . Transaminitis     Past Surgical History:  Procedure Laterality Date  . ABDOMINAL HYSTERECTOMY  1983  . CARDIAC CATHETERIZATION  10/17/2010    Left ventriculography shows mild global left ventricular hypokinesisThe LVEF is estimated at 50%.  Marland Kitchen FOOT FRACTURE SURGERY       Inpatient Medications: Scheduled Meds: . albuterol  2.5 mg Nebulization Q4H  . chlorhexidine gluconate (MEDLINE KIT)  15 mL Mouth Rinse BID  . Chlorhexidine Gluconate Cloth  6 each Topical Daily  . Chlorhexidine Gluconate Cloth  6 each Topical Daily  . clonazepam  0.5 mg Per Tube BID  . famotidine  20 mg Per Tube BID  . free water  200 mL Per Tube Q6H  . furosemide  40 mg Intravenous Q12H  . heparin  5,000 Units Subcutaneous Q8H  . insulin aspart  0-20 Units Subcutaneous Q4H  . insulin aspart  4 Units Subcutaneous Q4H  . levothyroxine  37.5 mcg Intravenous Daily  .  mouth rinse  15 mL Mouth Rinse 10 times per day  . propofol  500 mg Intravenous Once  . QUEtiapine  100 mg Oral BID  . sodium chloride flush  10-40 mL Intracatheter Q12H  . sodium chloride flush  10-40 mL Intracatheter Q12H  . sodium chloride flush  3 mL Intravenous Once   Continuous Infusions: . sodium chloride    . sodium chloride Stopped (07/22/18 0554)  . ceFEPime (MAXIPIME) IV 2 g (07/23/18 0622)  . dexmedetomidine (PRECEDEX) IV infusion 1.2  mcg/kg/hr (07/23/18 0932)  . diltiazem (CARDIZEM) infusion 5 mg/hr (07/22/18 1527)  . feeding supplement (VITAL AF 1.2 CAL) 1,000 mL (07/22/18 2215)  . fentaNYL infusion INTRAVENOUS 150 mcg/hr (07/23/18 0600)  . lactated ringers 250 mL (07/23/18 1215)  . magnesium sulfate 1 - 4 g bolus IVPB 2 g (07/23/18 1231)  . potassium chloride    . sodium chloride    . vancomycin 1,500 mg (07/23/18 1230)   PRN Meds: Place/Maintain arterial line **AND** sodium chloride, sodium chloride, fentaNYL, fentaNYL (SUBLIMAZE) injection, fentaNYL (SUBLIMAZE) injection, midazolam, sennosides, sodium chloride flush  Allergies:    Allergies  Allergen Reactions  . Metoprolol     fatigue    Social History:   Social History   Socioeconomic History  . Marital status: Married    Spouse name: Not on file  . Number of children: 2  . Years of education: Not on file  . Highest education level: Not on file  Occupational History  . Occupation: substitute Product manager: [employer]  . Financial resource strain: Not on file  . Food insecurity:    Worry: Not on file    Inability: Not on file  . Transportation needs:    Medical: Not on file    Non-medical: Not on file  Tobacco Use  . Smoking status: Never Smoker  . Smokeless tobacco: Never Used  Substance and Sexual Activity  . Alcohol use: No  . Drug use: No  . Sexual activity: Not on file  Lifestyle  . Physical activity:    Days per week: Not on file    Minutes per session: Not on file  . Stress: Not on file  Relationships  . Social connections:    Talks on phone: Not on file    Gets together: Not on file    Attends religious service: Not on file    Active member of club or organization: Not on file    Attends meetings of clubs or organizations: Not on file    Relationship status: Not on file  . Intimate partner violence:    Fear of current or ex partner: Not on file    Emotionally abused: Not on file     Physically abused: Not on file    Forced sexual activity: Not on file  Other Topics Concern  . Not on file  Social History Narrative  . Not on file    Family History:    Family History  Problem Relation Age of Onset  . Heart attack Father 47  . Hypertension Father   . Heart disease Father   . Hypertension Mother      ROS:  Please see the history of present illness.  All other ROS reviewed and negative.     Physical Exam/Data:   Vitals:   07/23/18 0816 07/23/18 0830 07/23/18 1159 07/23/18 1235  BP:  (!) 136/50    Pulse: (!) 108 (!) 107  95  Resp: (!) 30 (!) 27  (!) 32  Temp:      TempSrc:      SpO2:  100% 100%   Weight:      Height:        Intake/Output Summary (Last 24 hours) at 07/23/2018 1255 Last data filed at 07/23/2018 0800 Gross per 24 hour  Intake 7652.24 ml  Output 3810 ml  Net 3842.24 ml   Last 3 Weights 07/22/2018 07/21/2018 07/20/2018  Weight (lbs) 181 lb 14.1 oz 179 lb 7.3 oz 177 lb 7.5 oz  Weight (kg) 82.5 kg 81.4 kg 80.5 kg     Body mass index is 33.27 kg/m.  General: Ill-appearing pale female on trach HEENT: Trach placement Lymph: no adenopathy Neck: no JVD Endocrine:  No thryomegaly Vascular: No carotid bruits; FA pulses 2+ bilaterally without bruits  Cardiac:  normal S1, S2; regular tachycardic, no murmur Lungs:  clear to auscultation bilaterally, no wheezing, rhonchi or rales  Abd: soft, nontender, no hepatomegaly  Ext: no edema Musculoskeletal:  No deformities Skin: warm and dry  Neuro: Intubated Psych: Intubated  Telemetry:  Telemetry was personally reviewed and demonstrates: Sinus tachycardia at rate of 110 bpm  Relevant CV Studies: Pending echocardiogram  Laboratory Data:  Chemistry Recent Labs  Lab 07/21/18 0443  07/22/18 0440 07/22/18 1847 07/23/18 0253  NA 143   < > 142 141 142  K 4.2   < > 4.0 3.5 3.5  CL 113*  --  112*  --  105  CO2 21*  --  22  --  28  GLUCOSE 168*  --  258*  --  183*  BUN 30*  --  28*  --  30*    CREATININE 0.77  --  0.61  --  0.69  CALCIUM 8.4*  --  8.6*  --  9.4  GFRNONAA >60  --  >60  --  >60  GFRAA >60  --  >60  --  >60  ANIONGAP 9  --  8  --  9   < > = values in this interval not displayed.    Recent Labs  Lab 07/21/18 0443 07/22/18 0440 07/23/18 0253  PROT 5.1* 5.1* 5.2*  ALBUMIN 1.7* 1.6* 1.6*  AST 917* 379* 139*  ALT 1,285* 1,024* 649*  ALKPHOS 118 124 166*  BILITOT 0.6 0.7 0.9   Hematology Recent Labs  Lab 07/21/18 1708  07/22/18 0440 07/22/18 1847 07/23/18 0944  WBC 9.1  --  8.4  --  10.7*  RBC 3.52*  --  3.63*  --  3.63*  HGB 9.4*   < > 9.5* 10.5* 9.6*  HCT 30.2*   < > 31.2* 31.0* 30.7*  MCV 85.8  --  86.0  --  84.6  MCH 26.7  --  26.2  --  26.4  MCHC 31.1  --  30.4  --  31.3  RDW 20.2*  --  19.8*  --  20.1*  PLT 173  --  163  --  239   < > = values in this interval not displayed.   Cardiac Enzymes Recent Labs  Lab 07/22/18 1515 07/22/18 2203 07/23/18 0253 07/23/18 0830  TROPONINI 0.05* 0.06* 0.10* 0.10*   No results for input(s): TROPIPOC in the last 168 hours.  BNPNo results for input(s): BNP, PROBNP in the last 168 hours.  DDimer No results for input(s): DDIMER in the last 168 hours.  Radiology/Studies:  Ct Chest Wo Contrast  Result Date: 07/19/2018 CLINICAL DATA:  Pleural effusion  EXAM: CT CHEST WITHOUT CONTRAST TECHNIQUE: Multidetector CT imaging of the chest was performed following the standard protocol without IV contrast. COMPARISON:  07/19/2018, 07/18/2018, 07/17/2018 radiograph, CT chest 10/16/2010 FINDINGS: Cardiovascular: Limited evaluation without intravenous contrast. Nonaneurysmal aorta. Moderate aortic atherosclerosis. Heart size within normal limits. Trace pericardial effusion. Left-sided central venous catheter tip at the distal brachiocephalic region. Mediastinum/Nodes: Endotracheal tube tip about a cm superior to the carina. Esophageal tube tip below the diaphragm but non included. No thyroid mass. Dense calcification to the  left of the pulmonary trunk presumably a calcified node. Lungs/Pleura: Small moderate right pleural effusion. Moderate to large left pleural effusion. Diffuse consolidation throughout the left lung. Partial consolidation and ground-glass density in the right upper lobe and right lower lobe. Upper Abdomen: 24 mm rounded hypodensity within the posterior spleen. No acute abnormality in the upper abdomen. Musculoskeletal: Degenerative changes without acute or suspicious abnormality. IMPRESSION: 1. Small moderate right pleural effusion and moderate to large left pleural effusion. 2. Diffuse consolidation within the left lung with partial consolidation and ground-glass density in the right upper and lower lobes suspicious for multifocal pneumonia 3. Endotracheal tube tip just above the carina. 4. 24 mm rounded hypodensity within the spleen, incompletely characterized. Consider further evaluation with MRI or contrast-enhanced exam when clinically feasible. Aortic Atherosclerosis (ICD10-I70.0). Electronically Signed   By: Donavan Foil M.D.   On: 07/19/2018 17:50   Dg Chest Port 1 View  Result Date: 07/23/2018 CLINICAL DATA:  Follow-up pneumonia EXAM: PORTABLE CHEST 1 VIEW COMPARISON:  Yesterday FINDINGS: Endotracheal tube tip at the clavicular heads. The orogastric tube at least reaches the stomach. Left chest tube in stable position. Stable indistinct opacities at the bases and hazy density on the left in the mid chest. Calcified left hilar lymph nodes. The left IJ line has been removed. Possible trace left apical pneumothorax. IMPRESSION: 1. History of pneumonia with stable bilateral lung opacity. 2. Probable trace left apical pneumothorax with chest tube in stable position. Electronically Signed   By: Monte Fantasia M.D.   On: 07/23/2018 07:44   Dg Chest Port 1 View  Result Date: 07/22/2018 CLINICAL DATA:  Increased respirations. EXAM: PORTABLE CHEST 1 VIEW COMPARISON:  Radiograph of July 22, 2018.  FINDINGS: Stable cardiomediastinal silhouette. Endotracheal and nasogastric tubes are unchanged in position. Left-sided chest tube is noted without pneumothorax. Left internal jugular catheter is unchanged in position. Interval placement of right-sided PICC line with distal tip in expected position of cavoatrial junction. Mild bibasilar subsegmental atelectasis is noted with probable small pleural effusions. Bony thorax is unremarkable. IMPRESSION: Stable support apparatus. Left-sided chest tube is noted without pneumothorax. Interval placement of right-sided PICC line with distal tip in expected position of cavoatrial junction. Stable bibasilar subsegmental atelectasis with small pleural effusions. Electronically Signed   By: Marijo Conception, M.D.   On: 07/22/2018 19:59   Dg Chest Port 1 View  Result Date: 07/22/2018 CLINICAL DATA:  Pneumonia EXAM: PORTABLE CHEST 1 VIEW COMPARISON:  Yesterday FINDINGS: Endotracheal tube tip at the clavicular heads. Orogastric tube at least reaches the stomach. Left IJ line with tip at the right brachiocephalic vein, unchanged. Left-sided chest tube in stable position. History of pneumonia with hazy bilateral lung opacity. No convincing change from prior when allowing for differences in technique. No visible pneumothorax. Stable heart size. IMPRESSION: 1. Stable hardware positioning. 2. No convincing change in left more than right pneumonia. Electronically Signed   By: Monte Fantasia M.D.   On: 07/22/2018 07:35   Dg  Chest Port 1 View  Result Date: 07/21/2018 CLINICAL DATA:  73 year old female with abnormal LFTs. Intubated, pleural effusion and airspace opacity. EXAM: PORTABLE CHEST 1 VIEW COMPARISON:  07/20/2018 and earlier. FINDINGS: Portable AP semi upright view at 0507 hours. Endotracheal tube tip in good position just below the clavicles. Enteric tube courses to the abdomen, tip not included. Left chest tube remains in place. Substantially improved bilateral ventilation  since the CT on 07/19/2018, and further improved left lung base ventilation since yesterday. Residual confluent left mid lung opacity. Residual streaky opacity at the right lung base. No pneumothorax or pulmonary edema. Normal cardiac size and mediastinal contours. Paucity of bowel gas in the upper abdomen. IMPRESSION: 1. Stable lines and tubes. 2. Further improved ventilation since yesterday. Residual left mid lung and right lung base opacity. Electronically Signed   By: Genevie Ann M.D.   On: 07/21/2018 08:15   Dg Chest Port 1 View  Result Date: 07/20/2018 CLINICAL DATA:  Chest tube inserted, check position EXAM: PORTABLE CHEST - 1 VIEW COMPARISON:  Earlier film of the same day FINDINGS: Interval placement of left chest tube directed towards the suprahilar region. Interval decrease in left pleural effusion. Probable small residual lateral left pleural effusion. No pneumothorax. Patchy airspace opacities persist in the mid to lower left lung, with improved aeration. Calcified left hilar lymph node. Right basilar airspace opacity stable. The endotracheal tube tip is less than 2 cm above the carina. Gastric tube and left IJ central line stable in position. Heart size and mediastinal contours are within normal limits. Visualized bones unremarkable. IMPRESSION: Left chest tube placement with decrease in left pleural effusion. No pneumothorax. Electronically Signed   By: Lucrezia Europe M.D.   On: 07/20/2018 14:45   Dg Chest Port 1 View  Result Date: 07/20/2018 CLINICAL DATA:  73 year old female with history of pneumonia. EXAM: PORTABLE CHEST 1 VIEW COMPARISON:  Chest x-ray 07/19/2018. FINDINGS: There is a left-sided internal jugular central venous catheter with tip terminating in the proximal superior vena cava. An endotracheal tube is in place with tip 2.9 cm above the carina. A nasogastric tube is seen extending into the stomach, however, the tip of the nasogastric tube extends below the lower margin of the image.  Dense airspace consolidation throughout much of the mid to left lower lung. Opacity at the right lung base which may reflect an additional area of atelectasis and/or airspace consolidation. Small right pleural effusion. Moderate to large left pleural effusion. No evidence of pulmonary edema. Cardiac silhouette is largely obscured. Aortic atherosclerosis. Surgical clips projecting over the mid left hemithorax. IMPRESSION: 1. Support apparatus, as above. 2. Persistent bilateral pleural effusions (left greater than right) with areas of atelectasis and/or consolidation throughout the lungs bilaterally, most severe throughout the left lung, as detailed above. Electronically Signed   By: Vinnie Langton M.D.   On: 07/20/2018 07:01   [place] Ekg Site Rite  Result Date: 07/22/2018 If Site Rite image not attached, placement could not be confirmed due to current cardiac rhythm.  US Abdomen Limited Ruq  Result Date: 07/21/2018 CLINICAL DATA:  72 year old female with abnormal LFTs. Intubated, pleural effusion and airspace opacity. EXAM: ULTRASOUND ABDOMEN LIMITED RIGHT UPPER QUADRANT COMPARISON:  Chest CT without contrast 07/19/2018. Lumbar MRI 06/07/2018. FINDINGS: Gallbladder: Borderline to mild gallbladder wall thickening. Possible dependent sludge, but the gallbladder lumen appears free of echogenic stones. No pericholecystic fluid. Sonographic Percell Miller sign could not be evaluated as the patient is intubated. Common bile duct: Diameter: 3 millimeters, normal.  Liver: No focal lesion identified. Liver echogenicity at the upper limits of normal. Portal vein is patent on color Doppler imaging with normal direction of blood flow towards the liver. Other findings: Negative visible right kidney. No free fluid in the lower quadrants. IMPRESSION: 1. Gallbladder sludge with borderline to mild wall thickening. 2. No evidence of bile duct obstruction. Liver within normal limits. Electronically Signed   By: Genevie Ann M.D.   On:  07/21/2018 08:19    Assessment and Plan:   1. Atrial fibrillation Likely due to underlying lung issue.  Heart rate improved on IV Cardizem however currently discontinued due to hypotension this morning.  Her blood pressure has been stabilized.  Currently sinus tachycardia at rate of 110 bpm. CHADSVASC score of 4 for age, sex, CHF and hypertension.  Currently on heparin subq.   Would hold on systemic anticoagulation until has recurrence   May be   2.  Chronic diastolic heart failure -Euvolemic.   3.  Acute hypoxic respiratory failure secondary to flu and pneumonia -Per primary team.  Trach placed today.  4.  Elevated troponin -Flat trend.  Most likely due to demand in setting of elevated rate and acute illness.  Reviewed with Dr. Harrington Challenger who will see this patient later.  Will give 1 dose of IV metoprolol 5 mg and get EKG.  If this tolerates will give her scheduled dose of IV metoprolol every few hours.  For questions or updates, please contact [place] Please consult [email or web address] for contact info under     Jarrett Soho, PA  07/23/2018 12:55 PM   Patient seen and examined   I have reviewed assessment and modified note above that is by B Bhagat The pt is a 72 yo with hx of LBBB, minimal CAD, HL and HTN   Presents with flu, resp failure   Now with trach.   Yesterday developed atrial fibrillation   Treated with IV diltiazem    Stopped due to hypotension. Current in ST   Rates 110 On exam, pt with trach   Sedated  Neck  Difficult to assess JVP    Lungs with mild wheezes Cardiac RRR   No S3   No signif murmurs  No rubs Abd is supple    Ext   Feet and hands are warm    Tr edema  Labs signif for Cr 0.69  Alb 1.6   AST / ALT 139/649   Prelim eval of echo:  LVEF and RVEF are normal    1   Atrial fibrillation.   No prior history    May be related to acute illness with increased catecholamines. For now I would recomm trial of IV b blocker (scheduled)   Follow BP and  HR If recurs would put  on systemic anticoagulation.  2  Troponin   Trivial elevation in setting of severe systemic dz    No evid for active ischemia  3  LBBB   OLD  4   Acute on chronic diastolic CHF   Most like due to rate    Alb is 1.6   Pt is 10 L positive Would give 1 dose IV lasix and follow rsoponse   Will continue to follow

## 2018-07-23 NOTE — Progress Notes (Signed)
..   NAME:  Kristin Moon, MRN:  [id number], DOB:  December 04, 1945, LOS: 38 ADMISSION DATE:  07/14/2018, CONSULTATION DATE:  07/16/2018 REFERRING MD:  [facility] Hospitalist, CHIEF COMPLAINT:  SOB and fever   Brief History    73 yr old female w/ PMHx of NHL, HLD, HTN, Hypothyroidism, LBBB, CAD G1DD presented from home via EMS after coughing up blood and feeling weak, Tested flu positive at her PMD visit on 07/14/2018 and started on Tamiflu as outpatient but continued to worsen Failed BiPAP treatment in ED, intubated for acute respiratory failure.  Past Medical History  NHL (per son last radiation Rx was 5 yrs ago), HLD, HTN, Hypothyroidism, LBBB, CAD, G1DD (EF 50%) w/ a history of pneumonia last summer/fall not requiring hospitalization.   Significant Hospital Events   2/7 Admit with flu infection, pneumococcus PNA, intubated 2/10: CT chest ordered to further evaluate pleural space.  Findings consistent with large left and small to moderate right effusion 2/11: Spiking temperature of 104, cultures resent.  Left chest tube placed.  LFTs significantly elevated, ultrasound ordered.  Ultrasound abdomen negative for acute cholecystitis.   2/12: Still having fevers.  Antibiotics widened, added vancomycin and cefepime.  Discontinued fentanyl drip 2/13: Fever curve is down.  White blood cell count has improved.  Culture still pending.  Continuing nosocomial coverage.  Working on addressing sedation regimen.  Tidal volumes are adequate on spontaneous breathing trial but agitation and deconditioning seem to be a major barrier at this point.  Consults:  PCCM  Procedures:  Endotracheal intubation- 07/16/2018 >> CVC insertion - 07/16/2018 Thoracentesis 2/7 Bronch, BAL 2/7 Left chest tube 2/11>>  Significant Diagnostic Tests:  Chest x-ray 2/9- slight improvement in dense left lung consolidation.  I have reviewed the images personally. CT chest 2/10: Bibasilar left greater than right pneumonia/consolidation with  moderate to large left effusion and small to moderate right effusion. Right upper quadrant ultrasound 2/11>>> some sludge, no significant distention.  No pericholecystic fluid Micro Data: ( pending)  Blood cx x 2--> negative resp cx--> neg RVP--> Flu B Influenza swab--> Flu B U pneumococcus 2/7 > positive Blood 2/11>> Urine 2/11>>> Sputum 2/11>>> Normal [place]ora Left pleural fluid 2/11>>>  Antimicrobials:  Vancomycin: 2/6 > 2/7 Cefepime: 2/6 >> 2/10  Ceftriaxone 2/10 >>> 2/12 Tamiflu 2/5 >> completed 5 days Cefepime 2/12>>> Vancomycin 2/12>>>  Subjective    T max 99.8 Weaning on 10/5 at present Sedated, Follows no commands  Objective   Blood pressure (!) 147/57, pulse (!) 108, temperature 99.5 F (37.5 C), temperature source Axillary, resp. rate (!) 30, height _0  (1.575 m), weight 82.5 kg, SpO2 100 %. CVP:  [4 mmHg-9 mmHg] 4 mmHg  Vent Mode: CPAP;PSV FiO2 (%):  [40 %] 40 % Set Rate:  [22 bmp] 22 bmp Vt Set:  [350 mL] 350 mL PEEP:  [5 cmH20] 5 cmH20 Pressure Support:  [5 cmH20-10 cmH20] 10 cmH20 Plateau Pressure:  [18 cmH20] 18 cmH20   Intake/Output Summary (Last 24 hours) at 07/23/2018 0846 Last data filed at 07/23/2018 0600 Gross per 24 hour  Intake 7856.25 ml  Output 3840 ml  Net 4016.25 ml   Filed Weights   07/20/18 0500 07/21/18 0500 07/22/18 0442  Weight: 80.5 kg 81.4 kg 82.5 kg   Examination: General: This is a debilitated 73 year old white female currently sedated on ventilator, weaning on 10/5 HEENT normocephalic atraumatic orally intubated, OG tube,  no jugular venous distention Pulmonary: Diminished throughout, continued  tachypneic with verbal and tactile stimulus with accessory  use noted at times. L Chest tube , No leak, no minimal output . Cardiac: Atrial fib, rate controlled Abdomen: Soft not tender no organomegaly, Obese Extremities: Warm and dry brisk capillary refill Neuro: Opens eyes to call of name, is sedated on fentanyl and precedex, follows  no commands, but does open eyes to voice.    Septic shock Anion gap metabolic acidosis  AKI  Mild NAG metabolic acidosis 2/2 hyperchloremia from NaCl Hyperkalemia   Assessment & Plan:  73 year old with acute respiratory failure in the setting of flu B infection, Streptococcus pneumonia Deconditioning and anxiety are barriers to weaning and extubation  Respiratory failure 2/2 Flu, Streptococcus pneumonia and left exudative effusion Portable chest x-ray personally reviewed: Improved aeration bilaterally specifically on side of chest tube.  Continues to have bilateral pneumonia.  Aeration and perhaps a little improved.  Support tubes and lines in satisfactory position -She is weaning, however gets tachypneic and developed significant accessory use after she is stimulated.  Currently this is a barrier to extubation  Plan SBT as able  No change in chest tube management keep at 20 cm water suction VAP bundle IV Lasix 40 mg Q 12 Optimizing mental status>> minimize sedation RASS goal 0 Trend CXR Consider Trach today   Atrial Fibrillation with RVR Cardizem gtt initiated 2/13>> dropped pressures Troponin leak>> ? Demand ischemia Mag 1.7 Plan Wean Cardizem gtt as able Consider dosing per tube Tele monitoring Cards consult Trend troponins Echo pending Replete Mag and maintain > 2.0 Potassium goal > 4.0   Fever , with leukocytosis Plan Follow-up cultures Day #3 vancomycin and cefepime Trend fever and WBC CBC 2/14  and in am Additional cultures as are clinically indicated  Abnormal LFTs. RUQ [place] w/ some sludge but no stones of pericholecystic fluid.  -lfts continue trending down Plan Continue to trend LFTs Hold Tylenol CMET 2/15  Fluid and electrolyte imbalance (intermittent) NA has normalized Hypomag Hypo K While I&O shows 11 L positive, there has been a significant foley leak so doubt this is an accurate reflection of fluid status Plan Continue free water but  changed to every 8 hours Trend Chemistry Replete electrolytes as needed  Acute metabolic encephalopathy Failed fentanyl DC Plan Continue Fentanyl Wean  Precedex Continue Seroquel Add clonazepam Add clonidine taper Re-orient  Lights on during the day, off at night  Physical deconditioning Contributing barrier to extubation Plan Will need extensive PT and Occupational Therapy after extubation Continue SBT as able  Hypothyroidism, elevated blood sugar Plan No change in Synthroid CBG Q 4 Add tube feed coverage for basal insulin dosing and continue sliding scale Consult to diabetic team for consideration of addition of basal insulin  Anemia of critical illness -Hemoglobin stable Plan Trending CBC daily for now  Best practice:  Diet: Tube feeds Pain/Anxiety/Delirium protocol (if indicated): continuous fentanyl with prn versed VAP protocol (if indicated): yes DVT prophylaxis: Hep [place] and SCDs GI prophylaxis:  Pepcid IV Glucose control: SSI Mobility: continuous bedrest Code Status: FULL Family Communication: Family updated daily. Disposition remains critically ill due to need for titration of mechanical ventilation, optimization of volume status and titration of sedating drips  My Critical Care time : 40 minutes  Magdalen Spatz, AGACNP-BC [place] Pager # [phone] After 4 pm call [phone] 07/23/2018 8:47 AM

## 2018-07-23 NOTE — Progress Notes (Signed)
Inpatient Diabetes Program Recommendations  AACE/ADA: New Consensus Statement on Inpatient Glycemic Control (2015)  Target Ranges:  Prepandial:   less than 140 mg/dL      Peak postprandial:   less than 180 mg/dL (1-2 hours)      Critically ill patients:  140 - 180 mg/dL   Lab Results  Component Value Date   GLUCAP 190 (H) 07/23/2018    Review of Glycemic Control Results for [name], [name] (MRN [id number]) as of 07/23/2018 10:37  Ref. Range 07/22/2018 03:36 07/22/2018 08:23 07/22/2018 11:51 07/22/2018 16:15 07/22/2018 19:31 07/22/2018 23:23 07/23/2018 04:03 07/23/2018 07:44  Glucose-Capillary Latest Ref Range: 70 - 99 mg/dL 221 (H) 220 (H) 253 (H) 235 (H) 224 (H) 200 (H) 193 (H) 190 (H)   Diabetes history: DM 2 Outpatient Diabetes medications: None Current orders for Inpatient glycemic control:  Novolog resistant q 4 hours, Novolog 4 units q 4 hours  Inpatient Diabetes Program Recommendations:    Blood sugars improved this AM.  If blood sugars continue>goal, consider adding Levemir 6 units bid.    Thanks,  Kristin Perl, RN, BC-ADM Inpatient Diabetes Coordinator Pager [phone] (8a-5p)

## 2018-07-23 NOTE — Progress Notes (Signed)
eLink Physician-Brief Progress Note Patient Name: PRITI CONSOLI DOB: Jun 25, 1945 MRN: [id number]   Date of Service  07/23/2018  HPI/Events of Note  Foley leaking all day and night.   eICU Interventions  Ok to replace it.      Intervention Category Minor Interventions: Routine modifications to care plan (e.g. PRN medications for pain, fever)  Elmer Sow 07/23/2018, 1:56 AM

## 2018-07-23 NOTE — Progress Notes (Signed)
[place]  Date of admit: 08/05/2018  LOS: 8 days  Date of Procedure: 07/23/2018            PROCEDURE  NOTE   FLEXIBLE VIDEO BRONCHOSCOPY FOR PERCUTANEOUS DILATATIONAL TRACHEOSTOMY     OPERATOR  1. Dr. Brand Males - Flexible Bronchoscopy  2. Dr Michaelene Song Ellin Goodie - Percutaneous Dilatational Tracheostomy    RISKS  Risks of pneumothorax, hemothorax, sedation/anesthesia complications such as cardiac or respiratory arrest or hypotension, stroke and bleeding all explained. Benefits of diagnosis but limitations of non-diagnosis also explained. Patient/family verbalized understanding and wished to proceed.   CONSENT  Signed informed consent from patient famimu     PROCEDURE DETAILS  Procedure done by Dr Chase Caller on 07/23/2018  After ensuring adequate anesthesia, at first bronch was introduce through ET tube and structures of tracheal rings, carina identified for operator of tracheostomy listed above. Sunday Corn of bronch passed through trachea and skin for indentification of tracheal rings for tracheostomy puncture. After this, under bronchoscopy guidance, ET tube was pulled back sufficiently and very carefully. The ET tube was pulled back enough to give room for tracheostomy operator and yet at same time to to ensure a secured airway. After this was accomplished, bronchoscope was withdrawn into the ET tube. After this, The operator of tracheostomy listed above, performed tracheostomy under video visual provided by flexible video bronchoscopy. Followng introduction of tracheostomy, the bronchoscope was removed from ET tube and introduced through tracheostomy. Correct position of tracheostomy was ensured, with enough room between carina and distal tracheostomy and no evidence of bleeding. The bronchoscope was then withdrawn. Respiratory therapist was then instructed to remove the ET tube. Tracheostomy operator listed above then proceeded to complete the tracheostomy with stay sutures. AFter  This I drove the bronchoscope into left lower lobe and right lower lobe. From left lower lobe suctioned some thick mucus    COMPLICATIONS  No complications   Dr. Brand Males, M.D., [facility].C.P  Pulmonary and Critical Care Medicine  Staff Physician  [facility]  Pager: [phone], If no answer or between 15:00h - 7:00h: call [phone]  07/23/2018 and 11:57 AM

## 2018-07-23 NOTE — Procedures (Signed)
Cortrak  Person Inserting Tube:  Keijuan Schellhase M, RD Tube Type:  Cortrak - 43 inches Tube Location:  Left nare Initial Placement:  Stomach Secured by: Bridle Technique Used to Measure Tube Placement:  Documented cm marking at nare/ corner of mouth Cortrak Secured At:  70 cm    Cortrak Tube Team Note:  Consult received to place a Cortrak feeding tube.   X-ray is required, abdominal x-ray has been ordered by the Cortrak team. Please confirm tube placement before using the Cortrak tube.   If the tube becomes dislodged please keep the tube and contact the Cortrak team at [email or web address] (password [email or web address]) for replacement.  If after hours and replacement cannot be delayed, place a NG tube and confirm placement with an abdominal x-ray.      Bill Mcvey, MS, RD, LDN, CNSC Inpatient Clinical Dietitian Pager # [phone] After hours/weekend pager # [phone]   

## 2018-07-23 NOTE — Procedures (Signed)
Percutaneous Tracheostomy Placement  Consent from family.  Patient sedated, paralyzed and position.  Placed on 100% FiO2 and RR matched.  Area cleaned and draped.  Lidocaine/epi injected.  Skin incision done followed by blunt dissection.  Trachea palpated then punctured, catheter passed and visualized bronchoscopically.  Wire placed and visualized.  Catheter removed.  Airway then entered and dilated.  Size 6 cuffed shiley trach placed and visualized bronchoscopically well above carina.  Good volume returns.  Patient tolerated the procedure well without complications.  Minimal blood loss.  CXR ordered and pending.  Charis Juliana G. Wayman Hoard, M.D. [place] [facility]. Pager: [phone]. After hours pager: [phone].  

## 2018-07-23 NOTE — Progress Notes (Signed)
  Echocardiogram 2D Echocardiogram has been performed.  Makiah Clauson G Helma Argyle 07/23/2018, 10:21 AM

## 2018-07-24 ENCOUNTER — Inpatient Hospital Stay (HOSPITAL_COMMUNITY): Payer: Medicare Other

## 2018-07-24 DIAGNOSIS — I480 Paroxysmal atrial fibrillation: Secondary | ICD-10-CM

## 2018-07-24 DIAGNOSIS — J9620 Acute and chronic respiratory failure, unspecified whether with hypoxia or hypercapnia: Secondary | ICD-10-CM

## 2018-07-24 LAB — CBC
HCT: 30.8 % — ABNORMAL LOW (ref 36.0–46.0)
Hemoglobin: 9.1 g/dL — ABNORMAL LOW (ref 12.0–15.0)
MCH: 25.3 pg — ABNORMAL LOW (ref 26.0–34.0)
MCHC: 29.5 g/dL — ABNORMAL LOW (ref 30.0–36.0)
MCV: 85.8 fL (ref 80.0–100.0)
NRBC: 0.2 % (ref 0.0–0.2)
Platelets: 262 10*3/uL (ref 150–400)
RBC: 3.59 MIL/uL — AB (ref 3.87–5.11)
RDW: 20.6 % — ABNORMAL HIGH (ref 11.5–15.5)
WBC: 10.6 10*3/uL — ABNORMAL HIGH (ref 4.0–10.5)

## 2018-07-24 LAB — COMPREHENSIVE METABOLIC PANEL
ALK PHOS: 204 U/L — AB (ref 38–126)
ALT: 430 U/L — AB (ref 0–44)
AST: 83 U/L — ABNORMAL HIGH (ref 15–41)
Albumin: 1.7 g/dL — ABNORMAL LOW (ref 3.5–5.0)
Anion gap: 8 (ref 5–15)
BUN: 29 mg/dL — ABNORMAL HIGH (ref 8–23)
CALCIUM: 9 mg/dL (ref 8.9–10.3)
CO2: 30 mmol/L (ref 22–32)
Chloride: 103 mmol/L (ref 98–111)
Creatinine, Ser: 0.7 mg/dL (ref 0.44–1.00)
GFR calc Af Amer: >60 mL/min (ref >60)
GFR calc non Af Amer: >60 mL/min (ref >60)
Glucose, Bld: 163 mg/dL — ABNORMAL HIGH (ref 70–99)
Potassium: 3.5 mmol/L (ref 3.5–5.1)
Sodium: 141 mmol/L (ref 135–145)
Total Bilirubin: 0.6 mg/dL (ref 0.3–1.2)
Total Protein: 5.1 g/dL — ABNORMAL LOW (ref 6.5–8.1)

## 2018-07-24 LAB — MAGNESIUM: Magnesium: 2.1 mg/dL (ref 1.7–2.4)

## 2018-07-24 LAB — GLUCOSE, CAPILLARY
GLUCOSE-CAPILLARY: 109 mg/dL — AB (ref 70–99)
Glucose-Capillary: 147 mg/dL — ABNORMAL HIGH (ref 70–99)
Glucose-Capillary: 167 mg/dL — ABNORMAL HIGH (ref 70–99)
Glucose-Capillary: 161 mg/dL — ABNORMAL HIGH (ref 70–99)
Glucose-Capillary: 214 mg/dL — ABNORMAL HIGH (ref 70–99)
Glucose-Capillary: 166 mg/dL — ABNORMAL HIGH (ref 70–99)

## 2018-07-24 MED ORDER — IBUPROFEN 100 MG/5ML PO SUSP
200.0000 mg | Freq: Four times a day (QID) | ORAL | Status: DC | PRN
  Administered 2018-07-24 – 2018-07-27 (×6): 200 mg via ORAL
  Filled 2018-07-24 (×6): qty 10

## 2018-07-24 MED ORDER — POTASSIUM CHLORIDE 20 MEQ/15ML (10%) PO SOLN
40.0000 meq | Freq: Once | ORAL | Status: AC
  Administered 2018-07-24: 40 meq
  Filled 2018-07-24: qty 30

## 2018-07-24 MED ORDER — ACETAMINOPHEN 160 MG/5ML PO SOLN: 650.0000 mg | Freq: Four times a day (QID) | ORAL | Status: DC | PRN

## 2018-07-24 MED ORDER — FUROSEMIDE 10 MG/ML IJ SOLN
80.0000 mg | Freq: Three times a day (TID) | INTRAMUSCULAR | Status: DC
  Administered 2018-07-24 – 2018-07-26 (×6): 80 mg via INTRAVENOUS
  Filled 2018-07-24 (×6): qty 8

## 2018-07-24 NOTE — Plan of Care (Signed)
Fentanyl turned off this AM; remains on Precedex. Weaned about 1.5 - 2 hours this AM; plan to attempt again later.    Problem: Education: Goal: Knowledge of General Education information will improve Description Including pain rating scale, medication(s)/side effects and non-pharmacologic comfort measures Outcome: Progressing   Problem: Health Behavior/Discharge Planning: Goal: Ability to manage health-related needs will improve Outcome: Progressing   Problem: Clinical Measurements: Goal: Ability to maintain clinical measurements within normal limits will improve Outcome: Progressing Goal: Will remain free from infection Outcome: Progressing Goal: Diagnostic test results will improve Outcome: Progressing Goal: Respiratory complications will improve Outcome: Progressing Goal: Cardiovascular complication will be avoided Outcome: Progressing   Problem: Activity: Goal: Risk for activity intolerance will decrease Outcome: Progressing   Problem: Coping: Goal: Level of anxiety will decrease Outcome: Progressing   Problem: Elimination: Goal: Will not experience complications related to bowel motility Outcome: Progressing Goal: Will not experience complications related to urinary retention Outcome: Progressing   Problem: Pain Managment: Goal: General experience of comfort will improve Outcome: Progressing   Problem: Safety: Goal: Ability to remain free from injury will improve Outcome: Progressing   Problem: Skin Integrity: Goal: Risk for impaired skin integrity will decrease Outcome: Progressing   Problem: Activity: Goal: Ability to tolerate increased activity will improve Outcome: Progressing   Problem: Respiratory: Goal: Ability to maintain a clear airway and adequate ventilation will improve Outcome: Progressing   Problem: Role Relationship: Goal: Method of communication will improve Outcome: Progressing

## 2018-07-24 NOTE — Progress Notes (Signed)
SLP Cancellation Note  Patient Details Name: Kristin Moon MRN: [id number] DOB: 11/01/1945   Cancelled treatment:       Reason Eval/Treat Not Completed: Medical issues which prohibited therapy. Per charting pt remains on ventilator. Will follow up for PMV/swallowing evaluations when appropriate.  Deneise Lever, [place], CCC-SLP Speech-Language Pathologist Acute Rehabilitation Services Pager: [phone] Office: [phone]    Aliene Altes 07/24/2018, 11:59 AM

## 2018-07-24 NOTE — Progress Notes (Signed)
Pharmacy Antibiotic Note  Kristin Moon is a 73 y.o. female admitted on 07/26/2018 with fluB and S. pneumoniae pneumona treated with Tamiflu and cefepime>>ceftriaxone. She continued to have elevated temperatures, up to 104.2 and bilateral pleural effusions s/p L chest tube placement with concern for acalculous cholecystitis. Pharmacy has been consulted for vancomycin and cefepime dosing.   Day #4 of antibiotics. WBC down 10.6,  temp currently 100.7 (overall down). Scr improved and stable at 0.70, estimated CrCl ~60-65 mL/min.  Plan: Continue Vancomycin 1554m IV q24h (estimated AUC 480, goal 400-550, SCr 0.8) Continue Cefepime 2 g IV q8h F/u clinical status, repeat C&S, renal function, de-escalation, LOT, vancomycin levels as appropriate  Height: _0  (157.5 cm) Weight: 169 lb 5 oz (76.8 kg) IBW/kg (Calculated) : 50.1  Temp (24hrs), Avg:99.5 F (37.5 C), Min:98.9 F (37.2 C), Max:100.7 F (38.2 C)  Recent Labs  Lab 07/20/18 0450  07/21/18 0443 07/21/18 1708 07/22/18 0440 07/23/18 0253 07/23/18 0944 07/24/18 0422  WBC 10.1   < > 16.4* 9.1 8.4  --  10.7* 10.6*  CREATININE 1.35*  --  0.77  --  0.61 0.69  --  0.70   < > = values in this interval not displayed.    Estimated Creatinine Clearance: 62.8 mL/min (by C-G formula based on SCr of 0.7 mg/dL).    Allergies  Allergen Reactions  . Metoprolol     fatigue   Antimicrobials this admission: Vancomycin 2/6 >> 2/10; 2/12 >> Cefepime 2/6 >> 2/10; 2/12 >> Metronidazole 2/6 >> 2/7 Tamiflu 2/5 (OP) >> 2/10 CTX 2/10 >> 2/12  Microbiology results: 2/6 BCx: NG x3 2/7 RVP: fluB+ 2/7 MRSA PCR: neg 2/7 S. pneumo antigen: positive 2/7 BAL: NG final 2/7 pleural cx: normal flora 2/11 pleural cx: NG final 2/11 Bcx: NG final 2/11 TA cx: neg 2/11 Ucx: >100K yeast  Thank you for allowing pharmacy to be a part of this patient's care.  JSloan Leiter PharmD, BCPS, BCCCP Clinical Pharmacist Please refer to [facility] [place] numbers 07/24/2018 2:08 PM

## 2018-07-24 NOTE — Progress Notes (Signed)
Progress Note  Patient Name: Kristin Moon Date of Encounter: 07/24/2018  Primary Cardiologist:  Nahser  Subjective   Sedated with tracheostomy spoke with family   Inpatient Medications    Scheduled Meds: . albuterol  2.5 mg Nebulization Q4H  . chlorhexidine gluconate (MEDLINE KIT)  15 mL Mouth Rinse BID  . Chlorhexidine Gluconate Cloth  6 each Topical Daily  . Chlorhexidine Gluconate Cloth  6 each Topical Daily  . famotidine  20 mg Per Tube BID  . furosemide  80 mg Intravenous Q8H  . heparin  5,000 Units Subcutaneous Q8H  . insulin aspart  0-20 Units Subcutaneous Q4H  . insulin aspart  4 Units Subcutaneous Q4H  . levothyroxine  37.5 mcg Intravenous Daily  . mouth rinse  15 mL Mouth Rinse 10 times per day  . metoprolol tartrate  5 mg Intravenous Q6H  . QUEtiapine  100 mg Oral BID  . sodium chloride flush  10-40 mL Intracatheter Q12H  . sodium chloride flush  10-40 mL Intracatheter Q12H  . sodium chloride flush  3 mL Intravenous Once   Continuous Infusions: . sodium chloride Stopped (07/22/18 0554)  . ceFEPime (MAXIPIME) IV Stopped (07/24/18 7026)  . dexmedetomidine (PRECEDEX) IV infusion 0.8 mcg/kg/hr (07/24/18 1227)  . feeding supplement (VITAL AF 1.2 CAL) 1,000 mL (07/24/18 0242)  . fentaNYL infusion INTRAVENOUS Stopped (07/24/18 0807)  . sodium chloride    . vancomycin 1,500 mg (07/24/18 1225)   PRN Meds: sodium chloride, fentaNYL, fentaNYL (SUBLIMAZE) injection, fentaNYL (SUBLIMAZE) injection, midazolam, sennosides, sodium chloride flush   Vital Signs    Vitals:   07/24/18 1000 07/24/18 1100 07/24/18 1137 07/24/18 1204  BP: (!) 92/48 (!) 83/66    Pulse: (!) 104 91    Resp: (!) 33 (!) 29    Temp:    (!) 100.7 F (38.2 C)  TempSrc:    Axillary  SpO2: 97% 98% 98%   Weight:      Height:        Intake/Output Summary (Last 24 hours) at 07/24/2018 1228 Last data filed at 07/24/2018 1200 Gross per 24 hour  Intake 4704.27 ml  Output 5230 ml  Net -525.73 ml     Last 3 Weights 07/24/2018 07/22/2018 07/21/2018  Weight (lbs) 169 lb 5 oz 181 lb 14.1 oz 179 lb 7.3 oz  Weight (kg) 76.8 kg 82.5 kg 81.4 kg      Telemetry    Sinus tachycardia 100 -115 - Personally Reviewed  ECG    ST rate 105 chronic LBBB - Personally Reviewed  Physical Exam  Chronically ill white female  GEN: No acute distress.   Neck: on vent tracheostomy  Cardiac: RRR, no murmurs, rubs, or gallops.  Respiratory: Clear to auscultation bilaterally. GI: Feeding tube  MS: No edema; No deformity. Neuro:  Nonfocal  Psych: Normal affect   Labs    Chemistry Recent Labs  Lab 07/22/18 0440 07/22/18 1847 07/23/18 0253 07/24/18 0422  NA 142 141 142 141  K 4.0 3.5 3.5 3.5  CL 112*  --  105 103  CO2 22  --  28 30  GLUCOSE 258*  --  183* 163*  BUN 28*  --  30* 29*  CREATININE 0.61  --  0.69 0.70  CALCIUM 8.6*  --  9.4 9.0  PROT 5.1*  --  5.2* 5.1*  ALBUMIN 1.6*  --  1.6* 1.7*  AST 379*  --  139* 83*  ALT 1,024*  --  649* 430*  ALKPHOS 124  --  166* 204*  BILITOT 0.7  --  0.9 0.6  GFRNONAA >60  --  >60 >60  GFRAA >60  --  >60 >60  ANIONGAP 8  --  9 8     Hematology Recent Labs  Lab 07/22/18 0440 07/22/18 1847 07/23/18 0944 07/24/18 0422  WBC 8.4  --  10.7* 10.6*  RBC 3.63*  --  3.63* 3.59*  HGB 9.5* 10.5* 9.6* 9.1*  HCT 31.2* 31.0* 30.7* 30.8*  MCV 86.0  --  84.6 85.8  MCH 26.2  --  26.4 25.3*  MCHC 30.4  --  31.3 29.5*  RDW 19.8*  --  20.1* 20.6*  PLT 163  --  239 262    Cardiac Enzymes Recent Labs  Lab 07/22/18 1515 07/22/18 2203 07/23/18 0253 07/23/18 0830  TROPONINI 0.05* 0.06* 0.10* 0.10*   No results for input(s): TROPIPOC in the last 168 hours.   BNPNo results for input(s): BNP, PROBNP in the last 168 hours.   DDimer No results for input(s): DDIMER in the last 168 hours.   Radiology    Dg Chest Port 1 View  Result Date: 07/24/2018 CLINICAL DATA:  Chronic ventilator dependent respiratory failure. Follow-up BILATERAL pneumonia. EXAM:  PORTABLE CHEST 1 VIEW COMPARISON:  07/23/2018 and earlier, including CT chest 07/19/2018. FINDINGS: Tracheostomy tube tip in satisfactory position projecting below the thoracic inlet approximately 4 cm above the carina. RIGHT arm PICC tip projects at or near the cavoatrial junction. Feeding tube courses below the diaphragm into the stomach though its tip is not included on the image. LEFT chest tube remains in place with no pneumothorax and no significant residual LEFT pleural effusion. Significant improvement in aeration in the LEFT lung since yesterday, with residual consolidation and air bronchograms in the INFERIOR LEFT UPPER LOBE. Stable small RIGHT pleural effusion and associated consolidation in the RIGHT LOWER LOBE. Cardiac silhouette mildly to moderately enlarged, unchanged. Mild pulmonary venous hypertension without overt edema. IMPRESSION: 1.  Support apparatus satisfactory. 2. Improved aeration in the LEFT lung since yesterday, with resolution of LEFT LOWER LOBE atelectasis and/or pneumonia and improved LEFT UPPER LOBE pneumonia. Moderate residual airspace pneumonia with air bronchograms in the LEFT UPPER LOBE. 3. Stable small RIGHT pleural effusion and associated passive atelectasis and/or pneumonia involving the RIGHT LOWER LOBE. 4. No new abnormalities. Electronically Signed   By: Evangeline Dakin M.D.   On: 07/24/2018 08:25   Dg Chest Port 1 View  Result Date: 07/23/2018 CLINICAL DATA:  Tracheostomy placement. EXAM: PORTABLE CHEST 1 VIEW COMPARISON:  Radiograph of same day. FINDINGS: Stable cardiomegaly with central pulmonary vascular congestion. Endotracheal and nasogastric tubes have been removed. Tracheostomy tube appears to be in grossly good position. Right-sided PICC line is unchanged in position. Left-sided chest tube is noted with mild left apical pneumothorax. Increased left midlung and basilar opacity is noted concerning for atelectasis or pneumonia. Stable right basilar atelectasis is  noted. Bony thorax is unremarkable. IMPRESSION: Stable position of left-sided chest tube with mild left apical pneumothorax. Tracheostomy tube is in grossly good position. Increased left lung opacity is noted as described above. Electronically Signed   By: Marijo Conception, M.D.   On: 07/23/2018 12:54   Dg Chest Port 1 View  Result Date: 07/23/2018 CLINICAL DATA:  Follow-up pneumonia EXAM: PORTABLE CHEST 1 VIEW COMPARISON:  Yesterday FINDINGS: Endotracheal tube tip at the clavicular heads. The orogastric tube at least reaches the stomach. Left chest tube in stable position. Stable indistinct opacities at the bases and hazy density  on the left in the mid chest. Calcified left hilar lymph nodes. The left IJ line has been removed. Possible trace left apical pneumothorax. IMPRESSION: 1. History of pneumonia with stable bilateral lung opacity. 2. Probable trace left apical pneumothorax with chest tube in stable position. Electronically Signed   By: Monte Fantasia M.D.   On: 07/23/2018 07:44   Dg Chest Port 1 View  Result Date: 07/22/2018 CLINICAL DATA:  Increased respirations. EXAM: PORTABLE CHEST 1 VIEW COMPARISON:  Radiograph of July 22, 2018. FINDINGS: Stable cardiomediastinal silhouette. Endotracheal and nasogastric tubes are unchanged in position. Left-sided chest tube is noted without pneumothorax. Left internal jugular catheter is unchanged in position. Interval placement of right-sided PICC line with distal tip in expected position of cavoatrial junction. Mild bibasilar subsegmental atelectasis is noted with probable small pleural effusions. Bony thorax is unremarkable. IMPRESSION: Stable support apparatus. Left-sided chest tube is noted without pneumothorax. Interval placement of right-sided PICC line with distal tip in expected position of cavoatrial junction. Stable bibasilar subsegmental atelectasis with small pleural effusions. Electronically Signed   By: Marijo Conception, M.D.   On: 07/22/2018  19:59   Dg Abd Portable 1v  Result Date: 07/23/2018 CLINICAL DATA:  Evaluate feeding tube EXAM: PORTABLE ABDOMEN - 1 VIEW COMPARISON:  None. FINDINGS: The feeding tube terminates in the right lower quadrant of the abdomen, likely in the distal stomach. IMPRESSION: The feeding tube terminates in the right lower quadrant. The tip is favored to be in the distal aspect of the distended stomach. Electronically Signed   By: Dorise Bullion III M.D   On: 07/23/2018 16:28   [place] Ekg Site Rite  Result Date: 07/22/2018 If Site Rite image not attached, placement could not be confirmed due to current cardiac rhythm.   Cardiac Studies   TTE EF 50-55% no significant valve disease   Patient Profile     ZAIYA ANNUNZIATO is a 73 y.o. female with a hx of chronic diastolic heart failure, hypertension, hyperlipidemia, left bundle branch block and minimal CAD by cardiac cath in 2012 who is being seen today for the evaluation of atrial fibrillation at the request of Dr. Chase Caller.  Assessment & Plan    PAF:  Related to pneumonia and lung disease no systemic anticoagulation due to hemoptysis Continue iv llopressor Currently in NSR with sinus tachycardia TTE is benign with normal EF  Pulmonary:  S/p tracheostomy pneumonia full vent support per CCM        For questions or updates, please contact [place] Please consult [email or web address] for contact info under        Signed, Jenkins Rouge, MD  07/24/2018, 12:28 PM

## 2018-07-24 NOTE — Progress Notes (Signed)
..   NAME:  Kristin Moon, MRN:  [id number], DOB:  [birth year], LOS: 85 ADMISSION DATE:  07/22/2018, CONSULTATION DATE:  07/16/2018 REFERRING MD:  [facility] Hospitalist, CHIEF COMPLAINT:  SOB and fever   Brief History    73 yr old female w/ PMHx of NHL, HLD, HTN, Hypothyroidism, LBBB, CAD G1DD presented from home via EMS after coughing up blood and feeling weak, Tested flu positive at her PMD visit on 07/14/2018 and started on Tamiflu as outpatient but continued to worsen Failed BiPAP treatment in ED, intubated for acute respiratory failure.  Past Medical History  NHL (per son last radiation Rx was 7 yrs ago), HLD, HTN, Hypothyroidism, LBBB, CAD, G1DD (EF 50%) w/ a history of pneumonia last summer/fall not requiring hospitalization.   Significant Hospital Events   2/7 Admit with flu infection, pneumococcus PNA, intubated 2/10: CT chest ordered to further evaluate pleural space.  Findings consistent with large left and small to moderate right effusion 2/11: Spiking temperature of 104, cultures resent.  Left chest tube placed.  LFTs significantly elevated, ultrasound ordered.  Ultrasound abdomen negative for acute cholecystitis.   2/12: Still having fevers.  Antibiotics widened, added vancomycin and cefepime.  Discontinued fentanyl drip 2/13: Fever curve is down.  White blood cell count has improved.  Culture still pending.  Continuing nosocomial coverage.  Working on addressing sedation regimen.  Tidal volumes are adequate on spontaneous breathing trial but agitation and deconditioning seem to be a major barrier at this point. 07/23/2018 - TRACH by Dr Nelda Marseille  Consults:  PCCM  Procedures:  Endotracheal intubation- 07/16/2018 >> CVC insertion - 07/16/2018 Thoracentesis 2/7 Bronch, BAL 2/7 Left chest tube 2/11>>  Significant Diagnostic Tests:  Chest x-ray 2/9- slight improvement in dense left lung consolidation.  I have reviewed the images personally. CT chest 2/10: Bibasilar left greater than right  pneumonia/consolidation with moderate to large left effusion and small to moderate right effusion. Right upper quadrant ultrasound 2/11>>> some sludge, no significant distention.  No pericholecystic fluid Micro Data: ( pending)  Blood cx x 2--> negative resp cx--> neg RVP--> Flu B Influenza swab--> Flu B U pneumococcus 2/7 > positive Blood 2/11>> Urine 2/11>>> Sputum 2/11>>> Normal Flora Left pleural fluid 2/11>>>  Antimicrobials:  Vancomycin: 2/6 > 2/7 Cefepime: 2/6 >> 2/10  Ceftriaxone 2/10 >>> 2/12 Tamiflu 2/5 >> completed 5 days Cefepime 2/12>>> Vancomycin 2/12>>>  Subjective      07/24/2018 - post trach and bronch for trach - LLL atx has resolved. Has bilateral effusions.  Volume  +9.2L since admit (improved from +11.3L 2d ago). In sinus since yesterday and off cardizem. Seen by cards - CHADSVAC 4 but recommend holding anticoag till there is recurrence. A Fib felt due to medical issues. FAmily concnered patient not waknig up - despite off fent gtt x 4h. On precedex gtt and scheduled klonopin and seroquel. Moves all 4s per RN but mostly uppers and shakes her head    Objective   Blood pressure (!) 92/48, pulse (!) 104, temperature 99.6 F (37.6 C), temperature source Axillary, resp. rate (!) 33, height _0  (1.575 m), weight 76.8 kg, SpO2 97 %. CVP:  [2 mmHg-11 mmHg] 2 mmHg  Vent Mode: PRVC FiO2 (%):  [40 %] 40 % Set Rate:  [22 bmp] 22 bmp Vt Set:  [350 mL] 350 mL PEEP:  [5 cmH20] 5 cmH20 Pressure Support:  [10 cmH20] 10 cmH20 Plateau Pressure:  [18 cmH20-19 cmH20] 19 cmH20   Intake/Output Summary (Last 24 hours) at 07/24/2018  1052 Last data filed at 07/24/2018 1000 Gross per 24 hour  Intake 4619.22 ml  Output 5140 ml  Net -520.78 ml   Filed Weights   07/21/18 0500 07/22/18 0442 07/24/18 0500  Weight: 81.4 kg 82.5 kg 76.8 kg     General Appearance:  Looks criticall ill  Head:  Normocephalic, without obvious abnormality, atraumatic Eyes:  PERRL - yes,  conjunctiva/corneas - clear     Ears:  Normal external ear canals, both ears Nose:  G tube - yes Throat:  ETT TUBE - no , OG tube - no. TRACH +. Clean site Neck:  Supple,  No enlargement/tenderness/nodules Lungs: Clear to auscultation bilaterally, Ventilator   Synchrony - yes on precedex gtt. Otherwise dysnch Heart:  S1 and S2 normal, no murmur, CVP - no.  Pressors - no Abdomen:  Soft, no masses, no organomegaly Genitalia / Rectal:  Not done Extremities:  Extremities- 3rd spacing + Skin:  ntact in exposed areas . Sacral area - not examined Neurologic:  Sedation - percedex gtt + fent gtt holiday x 2h -> RASS - -3 . Moves all 4s - yes but more in uppers. CAM-ICU - +v . Orientation - not oriented      LABS    PULMONARY Recent Labs  Lab 07/18/18 0515 07/19/18 0401 07/21/18 0040 07/22/18 0353 07/22/18 1847  PHART 7.351 7.347* 7.421 7.410 7.453*  PCO2ART 43.9 38.3 34.7 36.7 39.3  PO2ART 74.0* 162* 119* 154.0* 130.0*  HCO3 24.3 20.0 22.0 23.3 27.5  TCO2 26  --   --  24 29  O2SAT 94.0 99.0 97.8 99.0 99.0    CBC Recent Labs  Lab 07/22/18 0440 07/22/18 1847 07/23/18 0944 07/24/18 0422  HGB 9.5* 10.5* 9.6* 9.1*  HCT 31.2* 31.0* 30.7* 30.8*  WBC 8.4  --  10.7* 10.6*  PLT 163  --  239 262    COAGULATION Recent Labs  Lab 07/19/18 2054  INR 1.34    CARDIAC   Recent Labs  Lab 07/22/18 1515 07/22/18 2203 07/23/18 0253 07/23/18 0830  TROPONINI 0.05* 0.06* 0.10* 0.10*   No results for input(s): PROBNP in the last 168 hours.   CHEMISTRY Recent Labs  Lab 07/18/18 0431  07/19/18 0534 07/20/18 0450 07/21/18 0443 07/22/18 0353 07/22/18 0440 07/22/18 1847 07/23/18 0253 07/24/18 0422  NA 146*   < > 147* 149* 143 141 142 141 142 141  K 4.2   < > 5.3* 4.7 4.2 4.0 4.0 3.5 3.5 3.5  CL 120*  --  120* 118* 113*  --  112*  --  105 103  CO2 21*  --  21* 21* 21*  --  22  --  28 30  GLUCOSE 230*  --  206* 171* 168*  --  258*  --  183* 163*  BUN 35*  --  41* 59* 30*   --  28*  --  30* 29*  CREATININE 0.95  --  0.95 1.35* 0.77  --  0.61  --  0.69 0.70  CALCIUM 8.8*  --  8.8* 8.8* 8.4*  --  8.6*  --  9.4 9.0  MG 2.5*  --  2.5*  --   --   --   --   --  1.7 2.1  PHOS 1.1*  --  1.6* 2.9 2.0*  --  2.9  --  3.5  --    < > = values in this interval not displayed.   Estimated Creatinine Clearance: 62.8 mL/min (by C-G formula based on  SCr of 0.7 mg/dL).   LIVER Recent Labs  Lab 07/19/18 2054  07/20/18 0450 07/20/18 1443 07/21/18 0443 07/22/18 0440 07/23/18 0253 07/24/18 0422  AST  --   --  2,296*  --  917* 379* 139* 83*  ALT  --   --  1,433*  --  1,285* 1,024* 649* 430*  ALKPHOS  --   --  145*  --  118 124 166* 204*  BILITOT  --   --  1.1  --  0.6 0.7 0.9 0.6  PROT  --    < > 5.3* 4.8* 5.1* 5.1* 5.2* 5.1*  ALBUMIN  --   --  1.9*  --  1.7* 1.6* 1.6* 1.7*  INR 1.34  --   --   --   --   --   --   --    < > = values in this interval not displayed.     INFECTIOUS Recent Labs  Lab 07/20/18 1111  PROCALCITON 7.15     ENDOCRINE CBG (last 3)  Recent Labs    07/23/18 2323 07/24/18 0419 07/24/18 0813  GLUCAP 132* 147* 109*         IMAGING x48h  - image(s) personally visualized  -   highlighted in bold Dg Chest Port 1 View  Result Date: 07/24/2018 CLINICAL DATA:  Chronic ventilator dependent respiratory failure. Follow-up BILATERAL pneumonia. EXAM: PORTABLE CHEST 1 VIEW COMPARISON:  07/23/2018 and earlier, including CT chest 07/19/2018. FINDINGS: Tracheostomy tube tip in satisfactory position projecting below the thoracic inlet approximately 4 cm above the carina. RIGHT arm PICC tip projects at or near the cavoatrial junction. Feeding tube courses below the diaphragm into the stomach though its tip is not included on the image. LEFT chest tube remains in place with no pneumothorax and no significant residual LEFT pleural effusion. Significant improvement in aeration in the LEFT lung since yesterday, with residual consolidation and air  bronchograms in the INFERIOR LEFT UPPER LOBE. Stable small RIGHT pleural effusion and associated consolidation in the RIGHT LOWER LOBE. Cardiac silhouette mildly to moderately enlarged, unchanged. Mild pulmonary venous hypertension without overt edema. IMPRESSION: 1.  Support apparatus satisfactory. 2. Improved aeration in the LEFT lung since yesterday, with resolution of LEFT LOWER LOBE atelectasis and/or pneumonia and improved LEFT UPPER LOBE pneumonia. Moderate residual airspace pneumonia with air bronchograms in the LEFT UPPER LOBE. 3. Stable small RIGHT pleural effusion and associated passive atelectasis and/or pneumonia involving the RIGHT LOWER LOBE. 4. No new abnormalities. Electronically Signed   By: Evangeline Dakin M.D.   On: 07/24/2018 08:25   Dg Chest Port 1 View  Result Date: 07/23/2018 CLINICAL DATA:  Tracheostomy placement. EXAM: PORTABLE CHEST 1 VIEW COMPARISON:  Radiograph of same day. FINDINGS: Stable cardiomegaly with central pulmonary vascular congestion. Endotracheal and nasogastric tubes have been removed. Tracheostomy tube appears to be in grossly good position. Right-sided PICC line is unchanged in position. Left-sided chest tube is noted with mild left apical pneumothorax. Increased left midlung and basilar opacity is noted concerning for atelectasis or pneumonia. Stable right basilar atelectasis is noted. Bony thorax is unremarkable. IMPRESSION: Stable position of left-sided chest tube with mild left apical pneumothorax. Tracheostomy tube is in grossly good position. Increased left lung opacity is noted as described above. Electronically Signed   By: Marijo Conception, M.D.   On: 07/23/2018 12:54   Dg Chest Port 1 View  Result Date: 07/23/2018 CLINICAL DATA:  Follow-up pneumonia EXAM: PORTABLE CHEST 1 VIEW COMPARISON:  Yesterday FINDINGS:  Endotracheal tube tip at the clavicular heads. The orogastric tube at least reaches the stomach. Left chest tube in stable position. Stable  indistinct opacities at the bases and hazy density on the left in the mid chest. Calcified left hilar lymph nodes. The left IJ line has been removed. Possible trace left apical pneumothorax. IMPRESSION: 1. History of pneumonia with stable bilateral lung opacity. 2. Probable trace left apical pneumothorax with chest tube in stable position. Electronically Signed   By: Monte Fantasia M.D.   On: 07/23/2018 07:44   Dg Chest Port 1 View  Result Date: 07/22/2018 CLINICAL DATA:  Increased respirations. EXAM: PORTABLE CHEST 1 VIEW COMPARISON:  Radiograph of July 22, 2018. FINDINGS: Stable cardiomediastinal silhouette. Endotracheal and nasogastric tubes are unchanged in position. Left-sided chest tube is noted without pneumothorax. Left internal jugular catheter is unchanged in position. Interval placement of right-sided PICC line with distal tip in expected position of cavoatrial junction. Mild bibasilar subsegmental atelectasis is noted with probable small pleural effusions. Bony thorax is unremarkable. IMPRESSION: Stable support apparatus. Left-sided chest tube is noted without pneumothorax. Interval placement of right-sided PICC line with distal tip in expected position of cavoatrial junction. Stable bibasilar subsegmental atelectasis with small pleural effusions. Electronically Signed   By: Marijo Conception, M.D.   On: 07/22/2018 19:59   Dg Abd Portable 1v  Result Date: 07/23/2018 CLINICAL DATA:  Evaluate feeding tube EXAM: PORTABLE ABDOMEN - 1 VIEW COMPARISON:  None. FINDINGS: The feeding tube terminates in the right lower quadrant of the abdomen, likely in the distal stomach. IMPRESSION: The feeding tube terminates in the right lower quadrant. The tip is favored to be in the distal aspect of the distended stomach. Electronically Signed   By: Dorise Bullion III M.D   On: 07/23/2018 16:28   [place] Ekg Site Rite  Result Date: 07/22/2018 If Site Rite image not attached, placement could not be confirmed due to  current cardiac rhythm.      Septic shock Anion gap metabolic acidosis  AKI  Mild NAG metabolic acidosis 2/2 hyperchloremia from NaCl Hyperkalemia   Assessment & Plan:  73 year old with acute respiratory failure in the setting of flu B infection, Streptococcus pneumonia Deconditioning and anxiety are barriers to weaning and extubation  Acute on chronic - Respiratory failure 2/2 Flu, Streptococcus pneumonia and left exudative effusion s/p chest tube and then  trach 07/23/2018  07/24/2018 =- vent dysnch prevents SBT. Needing precedex gtt at minimum for calm   Plan SBT as able  No change in chest tube management keep at 20 cm water suction VAP bundle    Atrial Fibrillation with RVR - Cardizem gtt initiated 2/13. A Fib resolved 2/14. CHADSVASC 4 - cards recommending anticoag if A fib recurs. ECHO ef 55% on 07/23/2018  07/24/2018 - in sinus  Plan Lopressor prn Cards opd followup   Flu and pneumococaal pneumonia   - afebrile since 07/21/2018  Plan Follow-up cultures Day #4 vancomycin and cefepime    Abnormal LFTs. RUQ [place] w/ some sludge but no stones of pericholecystic fluid.  -lfts continue trending down   Plan Continue to trend LFTs Hold Tylenol   Voume overload 2/15 - some better  Plan Dc free water Increase lasix   Acute metabolic encephalopathy  1/88  - stil ongoing  Plan Dc klonopin Continue seroquel Get CT head Wean fent gtt to extent possibl;e Precedex gtt to continue  Physical deconditioning Contributing barrier to extubation Plan LTAC consult   Hypothyroidism, elevated blood sugar Plan  No change in Synthroid CBG Q 4 Add tube feed coverage for basal insulin dosing and continue sliding scale    Anemia of critical illness -Hemoglobin stable Plan - PRBC for hgb </= 6.9gm%    - exceptions are   -  if ACS susepcted/confirmed then transfuse for hgb </= 8.0gm%,  or    -  active bleeding with hemodynamic instability, then transfuse  regardless of hemoglobin value   At at all times try to transfuse 1 unit prbc as possible with exception of active hemorrhage    Best practice:  Diet: Tube feeds Pain/Anxiety/Delirium protocol (if indicated):see above VAP protocol (if indicated): yes DVT prophylaxis: Hep [place] and SCDs GI prophylaxis:  Pepcid IV Glucose control: SSI Mobility: continuous bedrest Code Status: FULL Family Communication: family updated Disposition  ICU. Needs LTAC     ATTESTATION & SIGNATURE   The patient Kristin Moon is critically ill with multiple organ systems failure and requires high complexity decision making for assessment and support, frequent evaluation and titration of therapies, application of advanced monitoring technologies and extensive interpretation of multiple databases.   Critical Care Time devoted to patient care services described in this note is  30  Minutes. This time reflects time of care of this signee Dr Brand Males. This critical care time does not reflect procedure time, or teaching time or supervisory time of PA/NP/Med student/Med Resident etc but could involve care discussion time     Dr. Brand Males, M.D., [facility].C.P Pulmonary and Critical Care Medicine Staff Physician [facility] Pager: [phone], If no answer or between  15:00h - 7:00h: call [phone]  07/24/2018 10:52 AM

## 2018-07-24 NOTE — Progress Notes (Signed)
PT Cancellation Note  Patient Details Name: Kristin Moon MRN: [id number] DOB: [birth year]   Cancelled Treatment:    Reason Eval/Treat Not Completed: Fatigue/lethargy limiting ability to participate. Pt not arousing to verbal/tactile stimuli. Will monitor for ability to participate in PT.   Shary Decamp [place] 07/24/2018, 2:18 PM [name] Pager [phone] Office [phone]

## 2018-07-25 LAB — CBC WITH DIFFERENTIAL/PLATELET
Abs Immature Granulocytes: 0.16 10*3/uL — ABNORMAL HIGH (ref 0.00–0.07)
BASOS PCT: 0 %
Basophils Absolute: 0 10*3/uL (ref 0.0–0.1)
EOS PCT: 0 %
Eosinophils Absolute: 0 10*3/uL (ref 0.0–0.5)
HCT: 29.9 % — ABNORMAL LOW (ref 36.0–46.0)
Hemoglobin: 8.6 g/dL — ABNORMAL LOW (ref 12.0–15.0)
Immature Granulocytes: 2 %
Lymphocytes Relative: 5 %
Lymphs Abs: 0.5 10*3/uL — ABNORMAL LOW (ref 0.7–4.0)
MCH: 25.1 pg — ABNORMAL LOW (ref 26.0–34.0)
MCHC: 28.8 g/dL — ABNORMAL LOW (ref 30.0–36.0)
MCV: 87.2 fL (ref 80.0–100.0)
Monocytes Absolute: 0.5 10*3/uL (ref 0.1–1.0)
Monocytes Relative: 5 %
Neutro Abs: 8.6 10*3/uL — ABNORMAL HIGH (ref 1.7–7.7)
Neutrophils Relative %: 88 %
PLATELETS: 284 10*3/uL (ref 150–400)
RBC: 3.43 MIL/uL — ABNORMAL LOW (ref 3.87–5.11)
RDW: 20.6 % — ABNORMAL HIGH (ref 11.5–15.5)
WBC: 9.8 10*3/uL (ref 4.0–10.5)
nRBC: 0.2 % (ref 0.0–0.2)

## 2018-07-25 LAB — CULTURE, BODY FLUID W GRAM STAIN -BOTTLE: Culture: NO GROWTH

## 2018-07-25 LAB — CULTURE, BLOOD (ROUTINE X 2)
Culture: NO GROWTH
Culture: NO GROWTH
Special Requests: ADEQUATE

## 2018-07-25 LAB — CHOLESTEROL, BODY FLUID: Cholesterol, Fluid: 14 mg/dL

## 2018-07-25 LAB — VANCOMYCIN, PEAK: Vancomycin Pk: 52 ug/mL (ref 30–40)

## 2018-07-25 LAB — LACTIC ACID, PLASMA: Lactic Acid, Venous: 1.3 mmol/L (ref 0.5–1.9)

## 2018-07-25 LAB — GLUCOSE, CAPILLARY
Glucose-Capillary: 112 mg/dL — ABNORMAL HIGH (ref 70–99)
Glucose-Capillary: 189 mg/dL — ABNORMAL HIGH (ref 70–99)
Glucose-Capillary: 190 mg/dL — ABNORMAL HIGH (ref 70–99)
Glucose-Capillary: 197 mg/dL — ABNORMAL HIGH (ref 70–99)
Glucose-Capillary: 221 mg/dL — ABNORMAL HIGH (ref 70–99)
Glucose-Capillary: 230 mg/dL — ABNORMAL HIGH (ref 70–99)

## 2018-07-25 LAB — HEPATIC FUNCTION PANEL
ALK PHOS: 249 U/L — AB (ref 38–126)
ALT: 264 U/L — AB (ref 0–44)
AST: 52 U/L — ABNORMAL HIGH (ref 15–41)
Albumin: 1.9 g/dL — ABNORMAL LOW (ref 3.5–5.0)
Bilirubin, Direct: 0.2 mg/dL (ref 0.0–0.2)
Total Bilirubin: 0.8 mg/dL (ref 0.3–1.2)
Total Protein: 5.9 g/dL — ABNORMAL LOW (ref 6.5–8.1)

## 2018-07-25 LAB — MAGNESIUM: Magnesium: 2.3 mg/dL (ref 1.7–2.4)

## 2018-07-25 LAB — PROCALCITONIN: Procalcitonin: 1.06 ng/mL

## 2018-07-25 MED ORDER — SODIUM CHLORIDE 0.9 % IV SOLN
2.0000 g | Freq: Two times a day (BID) | INTRAVENOUS | Status: DC
  Administered 2018-07-26 – 2018-07-27 (×4): 2 g via INTRAVENOUS
  Filled 2018-07-25 (×4): qty 2

## 2018-07-25 MED ORDER — DEXMEDETOMIDINE HCL IN NACL 400 MCG/100ML IV SOLN
0.0000 ug/kg/h | INTRAVENOUS | Status: DC
  Filled 2018-07-25: qty 100

## 2018-07-25 MED ORDER — FLUCONAZOLE IN SODIUM CHLORIDE 400-0.9 MG/200ML-% IV SOLN
400.0000 mg | INTRAVENOUS | Status: DC
  Administered 2018-07-25 – 2018-07-26 (×2): 400 mg via INTRAVENOUS
  Filled 2018-07-25 (×3): qty 200

## 2018-07-25 MED ORDER — WHITE PETROLATUM EX OINT
TOPICAL_OINTMENT | CUTANEOUS | Status: AC
  Administered 2018-07-25: 13:00:00
  Filled 2018-07-25: qty 28.35

## 2018-07-25 MED ORDER — CLOTRIMAZOLE 2 % VA CREA
1.0000 | TOPICAL_CREAM | Freq: Every day | VAGINAL | Status: AC
  Administered 2018-07-25 – 2018-07-27 (×3): 1 via VAGINAL
  Filled 2018-07-25 (×2): qty 21

## 2018-07-25 MED ORDER — SODIUM CHLORIDE 0.9 % IV SOLN
INTRAVENOUS | Status: DC | PRN
  Administered 2018-07-25 – 2018-07-28 (×2): via INTRAVENOUS

## 2018-07-25 MED ORDER — POLYETHYLENE GLYCOL 3350 17 G PO PACK
17.0000 g | PACK | Freq: Every day | ORAL | Status: DC | PRN
  Administered 2018-07-25 – 2018-07-27 (×2): 17 g via ORAL
  Filled 2018-07-25 (×2): qty 1

## 2018-07-25 NOTE — Progress Notes (Signed)
Pt transported to CT and then back to ICU room 2Q56 without complications. RN and RT transported and remained with pt.

## 2018-07-25 NOTE — Progress Notes (Signed)
SLP Cancellation Note  Patient Details Name: Kristin Moon MRN: [id number] DOB: [birth year]   Cancelled treatment:       Reason Eval/Treat Not Completed: Medical issues which prohibited therapy  Patient currently on the ventilator and sedated.  Nursing reported that they are weaning.  ST will try back later or next date to assess for readiness for evaluation.  Kristin Flatten, MA, CCC-SLP Acute Rehab SLP [phone] Lamar Sprinkles 07/25/2018, 9:04 AM

## 2018-07-25 NOTE — Progress Notes (Signed)
PT placed back on full vent support due to her increased WOB.  RN at bedside and gave sedation.  RT will continue to monitor.

## 2018-07-25 NOTE — Progress Notes (Signed)
Pharmacy Antibiotic Note  Kristin Moon is a 73 y.o. female admitted on 07/16/2018 with fluB and S. pneumoniae pneumona treated with Tamiflu and cefepime>>ceftriaxone. She continued to have elevated temperatures, up to 104.2 and bilateral pleural effusions s/p L chest tube placement with concern for acalculous cholecystitis. Pharmacy has been consulted for vancomycin and cefepime dosing.   Day #5 of antibiotics. WBC down 10.6,  temp currently 100.7 (overall down). Scr improved and stable at 0.70, estimated CrCl ~60-65 mL/min.  *Vancomycin continued despite negative MRSA pcr due to continued fevers. Patient was re-cultured today.   *Vancomycin peak elevated at 52 on current dose of Vancomycin 1558m IV every 24 hours (drawn 1 hour after end of infusion). Good UOP but on high dose Lasix therapy. SCr falsely low. Will hold and recheck level in AM to calculate and determine accurate dosing for patient.   Plan: Hold Vancomycin and recheck level in AM (estimated AUC 480, goal 400-550, SCr 0.8) Will adjust Cefepime to 2 g IV q12h as not clearing drug well.  F/u clinical status, repeat C&S, renal function, de-escalation, LOT, vancomycin levels as appropriate  Height: _0  (157.5 cm) Weight: 164 lb 10.9 oz (74.7 kg) IBW/kg (Calculated) : 50.1  Temp (24hrs), Avg:100.4 F (38 C), Min:99.7 F (37.6 C), Max:101.5 F (38.6 C)  Recent Labs  Lab 07/20/18 0450  07/21/18 0443 07/21/18 1708 07/22/18 0440 07/23/18 0253 07/23/18 0944 07/24/18 0422 07/25/18 1228 07/25/18 1540 07/25/18 1544  WBC 10.1   < > 16.4* 9.1 8.4  --  10.7* 10.6*  --  9.8  --   CREATININE 1.35*  --  0.77  --  0.61 0.69  --  0.70  --   --   --   LATICACIDVEN  --   --   --   --   --   --   --   --  1.3  --   --   VANCOPEAK  --   --   --   --   --   --   --   --   --   --  52*   < > = values in this interval not displayed.    Estimated Creatinine Clearance: 61.9 mL/min (by C-G formula based on SCr of 0.7 mg/dL).    Allergies   Allergen Reactions  . Metoprolol     fatigue   Antimicrobials this admission: Vancomycin 2/6 >> 2/10; 2/12 >> Cefepime 2/6 >> 2/10; 2/12 >> Metronidazole 2/6 >> 2/7 Tamiflu 2/5 (OP) >> 2/10 CTX 2/10 >> 2/12  Microbiology results: 2/6 BCx: NG x3 2/7 RVP: fluB+ 2/7 MRSA PCR: neg 2/7 S. pneumo antigen: positive 2/7 BAL: NG final 2/7 pleural cx: normal flora 2/11 pleural cx: NG final 2/11 Bcx: NG final 2/11 TA cx: neg 2/11 Ucx: >100K yeast  Thank you for allowing pharmacy to be a part of this patient's care.  JSloan Leiter PharmD, BCPS, BCCCP Clinical Pharmacist Please refer to AThree [facility]ables [facility] Centerfor MHillcrestnumbers 07/25/2018 4:51 PM

## 2018-07-25 NOTE — Progress Notes (Signed)
..   NAME:  Kristin Moon, MRN:  [id number], DOB:  17-Dec-1945, LOS: 59 ADMISSION DATE:  07/20/2018, CONSULTATION DATE:  07/16/2018 REFERRING MD:  [facility] Hospitalist, CHIEF COMPLAINT:  SOB and fever   Brief History    73 yr old female w/ PMHx of NHL, HLD, HTN, Hypothyroidism, LBBB, CAD G1DD presented from home via EMS after coughing up blood and feeling weak, Tested flu positive at her PMD visit on 07/14/2018 and started on Tamiflu as outpatient but continued to worsen Failed BiPAP treatment in ED, intubated for acute respiratory failure.  Past Medical History  NHL (per son last radiation Rx was 63 yrs ago), HLD, HTN, Hypothyroidism, LBBB, CAD, G1DD (EF 50%) w/ a history of pneumonia last summer/fall not requiring hospitalization.   Significant Hospital Events   2/7 Admit with fluB infection, pneumococcus PNA, intubated 2/10: CT chest ordered to further evaluate pleural space.  Findings consistent with large left and small to moderate right effusion 2/11: Spiking temperature of 104, cultures resent.  Left chest tube placed.  LFTs significantly elevated, ultrasound ordered.  Ultrasound abdomen negative for acute cholecystitis.   2/12: Still having fevers.  Antibiotics widened, added vancomycin and cefepime.  Discontinued fentanyl drip 2/13: Fever curve is down.  White blood cell count has improved.  Culture still pending.  Continuing nosocomial coverage.  Working on addressing sedation regimen.  Tidal volumes are adequate on spontaneous breathing trial but agitation and deconditioning seem to be a major barrier at this point. 07/23/2018 - TRACH by Dr Nelda Marseille 2/15 -  post trach and bronch for trach - LLL atx has resolved. Has bilateral effusions.  Volume  +9.2L since admit (improved from +11.3L 2d ago). In sinus since yesterday and off cardizem. Seen by cards - CHADSVAC 4 but recommend holding anticoag till there is recurrence. A Fib felt due to medical issues. FAmily concnered patient not waknig up - despite  off fent gtt x 4h. On precedex gtt and scheduled klonopin and seroquel. Moves all 4s per RN but mostly uppers and shakes her head   Consults:  PCCM  Procedures:  Endotracheal intubation- 07/16/2018 >> CVC insertion - 07/16/2018 Thoracentesis 2/7 > LDH appriox 1200 Bronch, BAL 2/7 Left chest tube 2/11>>  Significant Diagnostic Tests:  Chest x-ray 2/9- slight improvement in dense left lung consolidation.  I have reviewed the images personally. CT chest 2/10: Bibasilar left greater than right pneumonia/consolidation with moderate to large left effusion and small to moderate right effusion. Right upper quadrant ultrasound 2/11>>> some sludge, no significant distention.  No pericholecystic fluid Micro Data: ( pending)  Blood cx x 2--> negative resp cx--> neg RVP--> Flu B Influenza swab--> Flu B U pneumococcus 2/7 > positive Blood 2/11>> Urine 2/11>>> Sputum 2/11>>> Normal Flora Left pleural fluid 2/11>>> ............... Pan culture 2/16 Sepsis biomarkerts 2/16  Antimicrobials:  Vancomycin: 2/6 > 2/7 Cefepime: 2/6 >> 2/10  Ceftriaxone 2/10 >>> 2/12 Tamiflu 2/5 >> completed 5 days Cefepime 2/12>>> Vancomycin 2/12>>>  Subjective      07/25/2018 - volume down to +7.4L. Febrile again 101F . Maintained on fent gtt (currently holiday) with precedex gtt on holiday -> tachypneic and need ssedation but able to track family and squeeze. Diffusely weak.  CT head negative.    Objective   Blood pressure 128/60, pulse (!) 111, temperature 100.2 F (37.9 C), temperature source Axillary, resp. rate (!) 28, height _0  (1.575 m), weight 74.7 kg, SpO2 100 %.    Vent Mode: CPAP;PSV FiO2 (%):  [40 %]  40 % Set Rate:  [22 bmp] 22 bmp Vt Set:  [350 mL] 350 mL PEEP:  [5 cmH20] 5 cmH20 Pressure Support:  [14 cmH20] 14 cmH20 Plateau Pressure:  [18 cmH20] 18 cmH20   Intake/Output Summary (Last 24 hours) at 07/25/2018 1122 Last data filed at 07/25/2018 1000 Gross per 24 hour  Intake 2678.47 ml   Output 4450 ml  Net -1771.53 ml   Filed Weights   07/22/18 0442 07/24/18 0500 07/25/18 0500  Weight: 82.5 kg 76.8 kg 74.7 kg    General Appearance:  Looks criticall ill Head:  Normocephalic, without obvious abnormality, atraumatic Eyes:  PERRL - yes, conjunctiva/corneas - clear     Ears:  Normal external ear canals, both ears Nose:  G tube - yes Throat:  ETT TUBE - no , OG tube - no. TRACH +, site clean Neck:  Supple,  No enlargement/tenderness/nodules Lungs: Clear to auscultation bilaterally, Ventilator   Synchrony - no when sedation off Heart:  S1 and S2 normal, no murmur, CVP - no.  Pressors - no Abdomen:  Soft, no masses, no organomegaly Genitalia / Rectal:  Not done Extremities:  Extremities- intact Skin:  ntact in exposed areas . Sacral area - not examined Neurologic:  Sedation - off fent gtt, precedex gtt -> RASS - -3 . Moves all 4s - uppers yes, lowers not, difusely weak. CAM-ICU - unable to test . Orientation - unable to test        LABS    PULMONARY Recent Labs  Lab 07/19/18 0401 07/21/18 0040 07/22/18 0353 07/22/18 1847  PHART 7.347* 7.421 7.410 7.453*  PCO2ART 38.3 34.7 36.7 39.3  PO2ART 162* 119* 154.0* 130.0*  HCO3 20.0 22.0 23.3 27.5  TCO2  --   --  24 29  O2SAT 99.0 97.8 99.0 99.0    CBC Recent Labs  Lab 07/22/18 0440 07/22/18 1847 07/23/18 0944 07/24/18 0422  HGB 9.5* 10.5* 9.6* 9.1*  HCT 31.2* 31.0* 30.7* 30.8*  WBC 8.4  --  10.7* 10.6*  PLT 163  --  239 262    COAGULATION Recent Labs  Lab 07/19/18 2054  INR 1.34    CARDIAC   Recent Labs  Lab 07/22/18 1515 07/22/18 2203 07/23/18 0253 07/23/18 0830  TROPONINI 0.05* 0.06* 0.10* 0.10*   No results for input(s): PROBNP in the last 168 hours.   CHEMISTRY Recent Labs  Lab 07/19/18 0534 07/20/18 0450 07/21/18 0443 07/22/18 0353 07/22/18 0440 07/22/18 1847 07/23/18 0253 07/24/18 0422 07/25/18 0539  NA 147* 149* 143 141 142 141 142 141  --   K 5.3* 4.7 4.2 4.0 4.0  3.5 3.5 3.5  --   CL 120* 118* 113*  --  112*  --  105 103  --   CO2 21* 21* 21*  --  22  --  28 30  --   GLUCOSE 206* 171* 168*  --  258*  --  183* 163*  --   BUN 41* 59* 30*  --  28*  --  30* 29*  --   CREATININE 0.95 1.35* 0.77  --  0.61  --  0.69 0.70  --   CALCIUM 8.8* 8.8* 8.4*  --  8.6*  --  9.4 9.0  --   MG 2.5*  --   --   --   --   --  1.7 2.1 2.3  PHOS 1.6* 2.9 2.0*  --  2.9  --  3.5  --   --    Estimated Creatinine  Clearance: 61.9 mL/min (by C-G formula based on SCr of 0.7 mg/dL).   LIVER Recent Labs  Lab 07/19/18 2054  07/21/18 0443 07/22/18 0440 07/23/18 0253 07/24/18 0422 07/25/18 0539  AST  --    < > 917* 379* 139* 83* 52*  ALT  --    < > 1,285* 1,024* 649* 430* 264*  ALKPHOS  --    < > 118 124 166* 204* 249*  BILITOT  --    < > 0.6 0.7 0.9 0.6 0.8  PROT  --    < > 5.1* 5.1* 5.2* 5.1* 5.9*  ALBUMIN  --    < > 1.7* 1.6* 1.6* 1.7* 1.9*  INR 1.34  --   --   --   --   --   --    < > = values in this interval not displayed.     INFECTIOUS Recent Labs  Lab 07/20/18 1111  PROCALCITON 7.15     ENDOCRINE CBG (last 3)  Recent Labs    07/24/18 2309 07/25/18 0335 07/25/18 0818  GLUCAP 166* 112* 190*         IMAGING x48h  - image(s) personally visualized  -   highlighted in bold Ct Head Wo Contrast  Result Date: 07/24/2018 CLINICAL DATA:  Encephalopathy. EXAM: CT HEAD WITHOUT CONTRAST TECHNIQUE: Contiguous axial images were obtained from the base of the skull through the vertex without intravenous contrast. COMPARISON:  None. FINDINGS: Brain: There is no evidence of acute infarct, intracranial hemorrhage, mass, midline shift, or extra-axial fluid collection. The ventricles and sulci are within normal limits for age. Vascular: No hyperdense vessel. Skull: No fracture or focal osseous lesion. Sinuses/Orbits: Mild sphenoid sinus mucosal thickening. Small bilateral mastoid effusions. Unremarkable orbits. Other: Partially visualized nasoenteric tube.  IMPRESSION: Unremarkable CT appearance of the brain. Electronically Signed   By: Logan Bores M.D.   On: 07/24/2018 21:45   Dg Chest Port 1 View  Result Date: 07/24/2018 CLINICAL DATA:  Chronic ventilator dependent respiratory failure. Follow-up BILATERAL pneumonia. EXAM: PORTABLE CHEST 1 VIEW COMPARISON:  07/23/2018 and earlier, including CT chest 07/19/2018. FINDINGS: Tracheostomy tube tip in satisfactory position projecting below the thoracic inlet approximately 4 cm above the carina. RIGHT arm PICC tip projects at or near the cavoatrial junction. Feeding tube courses below the diaphragm into the stomach though its tip is not included on the image. LEFT chest tube remains in place with no pneumothorax and no significant residual LEFT pleural effusion. Significant improvement in aeration in the LEFT lung since yesterday, with residual consolidation and air bronchograms in the INFERIOR LEFT UPPER LOBE. Stable small RIGHT pleural effusion and associated consolidation in the RIGHT LOWER LOBE. Cardiac silhouette mildly to moderately enlarged, unchanged. Mild pulmonary venous hypertension without overt edema. IMPRESSION: 1.  Support apparatus satisfactory. 2. Improved aeration in the LEFT lung since yesterday, with resolution of LEFT LOWER LOBE atelectasis and/or pneumonia and improved LEFT UPPER LOBE pneumonia. Moderate residual airspace pneumonia with air bronchograms in the LEFT UPPER LOBE. 3. Stable small RIGHT pleural effusion and associated passive atelectasis and/or pneumonia involving the RIGHT LOWER LOBE. 4. No new abnormalities. Electronically Signed   By: Evangeline Dakin M.D.   On: 07/24/2018 08:25   Dg Chest Port 1 View  Result Date: 07/23/2018 CLINICAL DATA:  Tracheostomy placement. EXAM: PORTABLE CHEST 1 VIEW COMPARISON:  Radiograph of same day. FINDINGS: Stable cardiomegaly with central pulmonary vascular congestion. Endotracheal and nasogastric tubes have been removed. Tracheostomy tube appears  to be in grossly  good position. Right-sided PICC line is unchanged in position. Left-sided chest tube is noted with mild left apical pneumothorax. Increased left midlung and basilar opacity is noted concerning for atelectasis or pneumonia. Stable right basilar atelectasis is noted. Bony thorax is unremarkable. IMPRESSION: Stable position of left-sided chest tube with mild left apical pneumothorax. Tracheostomy tube is in grossly good position. Increased left lung opacity is noted as described above. Electronically Signed   By: Marijo Conception, M.D.   On: 07/23/2018 12:54   Dg Abd Portable 1v  Result Date: 07/23/2018 CLINICAL DATA:  Evaluate feeding tube EXAM: PORTABLE ABDOMEN - 1 VIEW COMPARISON:  None. FINDINGS: The feeding tube terminates in the right lower quadrant of the abdomen, likely in the distal stomach. IMPRESSION: The feeding tube terminates in the right lower quadrant. The tip is favored to be in the distal aspect of the distended stomach. Electronically Signed   By: Dorise Bullion III M.D   On: 07/23/2018 16:28       Septic shock Anion gap metabolic acidosis  AKI  Mild NAG metabolic acidosis 2/2 hyperchloremia from NaCl Hyperkalemia   Assessment & Plan:  73 year old with acute respiratory failure in the setting of flu B infection, Streptococcus pneumonia Deconditioning and anxiety are barriers to weaning and extubation  Acute on chronic - Respiratory failure 2/2 Flu, Streptococcus pneumonia and left exudative effusion s/p chest tube and then  trach 07/23/2018  07/25/2018 =- unable to get off sedation due to vent dynschrony due to profound weakness  Plan SBT as able  No change in chest tube management keep at 20 cm water suction VAP bundle Full vent support    Atrial Fibrillation with RVR - Cardizem gtt initiated 2/13. A Fib resolved 2/14. CHADSVASC 4 - cards recommending anticoag if A fib recurs. ECHO ef 55% on 07/23/2018  07/25/2018 - in sinus  Plan Lopressor prn Cards  opd followup Anticoagulation if recurs  Flu and pneumococaal pneumonia   - afebrile since 07/21/2018 but febrile again 07/24/2018 despite antibiotics   Plan Follow-up cultures Day #4 vancomycin and cefepime Pan culture 2/16 Sepsis biomarkerts 2/16    Abnormal LFTs. RUQ [place] w/ some sludge but no stones of pericholecystic fluid.  -lfts continue trending down   Plan Continue to trend LFTs Hold Tylenol   Voume overload 2/16 -some better  Plan Lasix    Acute metabolic encephalopathy,. CT head neg 07/24/2018  2/16 - still ongoing but migiht be an issue more of severe deconditioning  Plan Off klonopin Dc deroquel Wean fent gtt to extent possibl;e Precedex gtt to continue  Physical deconditioning Contributing barrier to extubation Plan LTAC consult - family informed 2/16   Hypothyroidism, elevated blood sugar Plan No change in Synthroid CBG Q 4  tube feed coverage for basal insulin dosing and continue sliding scale    Anemia of critical illness -Hemoglobin stable Plan - PRBC for hgb </= 6.9gm%    - exceptions are   -  if ACS susepcted/confirmed then transfuse for hgb </= 8.0gm%,  or    -  active bleeding with hemodynamic instability, then transfuse regardless of hemoglobin value   At at all times try to transfuse 1 unit prbc as possible with exception of active hemorrhage    Best practice:  Diet: Tube feeds Pain/Anxiety/Delirium protocol (if indicated):see above VAP protocol (if indicated): yes DVT prophylaxis: Hep [place] and SCDs GI prophylaxis:  Pepcid IV Glucose control: SSI Mobility: continuous bedrest Code Status: FULL Family Communication: family updated Disposition  ICU.  Needs LTAC     ATTESTATION & SIGNATURE   The patient Kristin Moon is critically ill with multiple organ systems failure and requires high complexity decision making for assessment and support, frequent evaluation and titration of therapies, application of advanced  monitoring technologies and extensive interpretation of multiple databases.   Critical Care Time devoted to patient care services described in this note is  30  Minutes. This time reflects time of care of this signee Dr Brand Males. This critical care time does not reflect procedure time, or teaching time or supervisory time of PA/NP/Med student/Med Resident etc but could involve care discussion time     Dr. Brand Males, M.D., [facility].C.P Pulmonary and Critical Care Medicine Staff Physician [facility] Pager: [phone], If no answer or between  15:00h - 7:00h: call [phone]  07/25/2018 11:58 AM

## 2018-07-25 NOTE — Progress Notes (Signed)
Wasted 154mL on Fentanyl with [employer] in the sink.

## 2018-07-25 NOTE — Progress Notes (Signed)
SLP Cancellation Note  Patient Details Name: CHARLOTTIE PERAGINE MRN: [id number] DOB: Jul 12, 1945   Cancelled treatment:       Reason Eval/Treat Not Completed: Medical issues which prohibited therapy  Patient remains on the ventilator and lethargy.  ST will hold on PMV and BSE evaluations for today.     Shelly Flatten, MA, [place] Acute Rehab SLP [phone] Lamar Sprinkles 07/25/2018, 11:13 AM

## 2018-07-26 ENCOUNTER — Inpatient Hospital Stay (HOSPITAL_COMMUNITY): Payer: Medicare Other

## 2018-07-26 DIAGNOSIS — J189 Pneumonia, unspecified organism: Secondary | ICD-10-CM

## 2018-07-26 DIAGNOSIS — J181 Lobar pneumonia, unspecified organism: Secondary | ICD-10-CM

## 2018-07-26 DIAGNOSIS — Z930 Tracheostomy status: Secondary | ICD-10-CM

## 2018-07-26 LAB — CBC WITH DIFFERENTIAL/PLATELET
Abs Immature Granulocytes: 0.16 10*3/uL — ABNORMAL HIGH (ref 0.00–0.07)
Basophils Absolute: 0 10*3/uL (ref 0.0–0.1)
Basophils Relative: 0 %
Eosinophils Absolute: 0.2 10*3/uL (ref 0.0–0.5)
Eosinophils Relative: 2 %
HCT: 32.1 % — ABNORMAL LOW (ref 36.0–46.0)
Hemoglobin: 9.7 g/dL — ABNORMAL LOW (ref 12.0–15.0)
Immature Granulocytes: 1 %
Lymphocytes Relative: 8 %
Lymphs Abs: 1.1 10*3/uL (ref 0.7–4.0)
MCH: 26.3 pg (ref 26.0–34.0)
MCHC: 30.2 g/dL (ref 30.0–36.0)
MCV: 87 fL (ref 80.0–100.0)
Monocytes Absolute: 0.9 10*3/uL (ref 0.1–1.0)
Monocytes Relative: 7 %
Neutro Abs: 10.9 10*3/uL — ABNORMAL HIGH (ref 1.7–7.7)
Neutrophils Relative %: 82 %
Platelets: 356 10*3/uL (ref 150–400)
RBC: 3.69 MIL/uL — ABNORMAL LOW (ref 3.87–5.11)
RDW: 20.4 % — ABNORMAL HIGH (ref 11.5–15.5)
WBC: 13.3 10*3/uL — ABNORMAL HIGH (ref 4.0–10.5)
nRBC: 0.2 % (ref 0.0–0.2)

## 2018-07-26 LAB — PHOSPHORUS: Phosphorus: 2.2 mg/dL — ABNORMAL LOW (ref 2.5–4.6)

## 2018-07-26 LAB — BASIC METABOLIC PANEL
ANION GAP: 10 (ref 5–15)
Anion gap: 7 (ref 5–15)
BUN: 36 mg/dL — ABNORMAL HIGH (ref 8–23)
BUN: 35 mg/dL — ABNORMAL HIGH (ref 8–23)
CALCIUM: 9.5 mg/dL (ref 8.9–10.3)
CO2: 33 mmol/L — ABNORMAL HIGH (ref 22–32)
CO2: 34 mmol/L — ABNORMAL HIGH (ref 22–32)
CREATININE: 0.79 mg/dL (ref 0.44–1.00)
Calcium: 9.6 mg/dL (ref 8.9–10.3)
Chloride: 102 mmol/L (ref 98–111)
Chloride: 104 mmol/L (ref 98–111)
Creatinine, Ser: 0.67 mg/dL (ref 0.44–1.00)
GFR calc Af Amer: >60 mL/min (ref >60)
GFR calc non Af Amer: >60 mL/min (ref >60)
GLUCOSE: 192 mg/dL — AB (ref 70–99)
Glucose, Bld: 202 mg/dL — ABNORMAL HIGH (ref 70–99)
Potassium: 2.9 mmol/L — ABNORMAL LOW (ref 3.5–5.1)
Potassium: 3.3 mmol/L — ABNORMAL LOW (ref 3.5–5.1)
Sodium: 145 mmol/L (ref 135–145)
Sodium: 145 mmol/L (ref 135–145)

## 2018-07-26 LAB — GLUCOSE, CAPILLARY
GLUCOSE-CAPILLARY: 194 mg/dL — AB (ref 70–99)
Glucose-Capillary: 207 mg/dL — ABNORMAL HIGH (ref 70–99)
Glucose-Capillary: 199 mg/dL — ABNORMAL HIGH (ref 70–99)
Glucose-Capillary: 206 mg/dL — ABNORMAL HIGH (ref 70–99)
Glucose-Capillary: 235 mg/dL — ABNORMAL HIGH (ref 70–99)

## 2018-07-26 LAB — MAGNESIUM: MAGNESIUM: 2.1 mg/dL (ref 1.7–2.4)

## 2018-07-26 LAB — PROCALCITONIN: Procalcitonin: 0.99 ng/mL

## 2018-07-26 LAB — URINE CULTURE
Culture: <10000 — AB
Special Requests: NORMAL

## 2018-07-26 LAB — LACTIC ACID, PLASMA: Lactic Acid, Venous: 1.1 mmol/L (ref 0.5–1.9)

## 2018-07-26 LAB — VANCOMYCIN, RANDOM: Vancomycin Rm: 15

## 2018-07-26 LAB — VANCOMYCIN, TROUGH: Vancomycin Tr: 13 ug/mL — ABNORMAL LOW (ref 15–20)

## 2018-07-26 MED ORDER — METOPROLOL TARTRATE 25 MG/10 ML ORAL SUSPENSION
25.0000 mg | Freq: Three times a day (TID) | ORAL | Status: DC
  Administered 2018-07-26 – 2018-07-28 (×7): 25 mg
  Filled 2018-07-26 (×7): qty 10

## 2018-07-26 MED ORDER — POTASSIUM CHLORIDE 10 MEQ/100ML IV SOLN
10.0000 meq | INTRAVENOUS | Status: AC
  Administered 2018-07-26 (×4): 10 meq via INTRAVENOUS
  Filled 2018-07-26 (×4): qty 100

## 2018-07-26 MED ORDER — POTASSIUM CHLORIDE 20 MEQ/15ML (10%) PO SOLN
40.0000 meq | Freq: Once | ORAL | Status: AC
  Administered 2018-07-26: 40 meq
  Filled 2018-07-26: qty 30

## 2018-07-26 MED ORDER — LEVOTHYROXINE SODIUM 100 MCG/5ML IV SOLN: 25.0000 ug | Freq: Every day | INTRAVENOUS | Status: DC

## 2018-07-26 MED ORDER — VANCOMYCIN HCL IN DEXTROSE 750-5 MG/150ML-% IV SOLN
750.0000 mg | Freq: Two times a day (BID) | INTRAVENOUS | Status: DC
  Administered 2018-07-26 – 2018-07-27 (×3): 750 mg via INTRAVENOUS
  Filled 2018-07-26 (×4): qty 150

## 2018-07-26 MED ORDER — LEVOTHYROXINE SODIUM 50 MCG PO TABS: 50.0000 ug | ORAL_TABLET | Freq: Every day | ORAL | Status: DC

## 2018-07-26 MED ORDER — METOPROLOL SUCCINATE ER 25 MG PO TB24: 25.0000 mg | ORAL_TABLET | Freq: Three times a day (TID) | ORAL | Status: DC

## 2018-07-26 MED ORDER — LEVOTHYROXINE SODIUM 50 MCG PO TABS
50.0000 ug | ORAL_TABLET | Freq: Every day | ORAL | Status: DC
  Administered 2018-07-27 – 2018-07-29 (×3): 50 ug via ORAL
  Filled 2018-07-26 (×3): qty 1

## 2018-07-26 MED ORDER — ACETAMINOPHEN 160 MG/5ML PO SOLN
650.0000 mg | Freq: Three times a day (TID) | ORAL | Status: DC | PRN
  Administered 2018-07-26 – 2018-07-28 (×4): 650 mg
  Filled 2018-07-26 (×4): qty 20.3

## 2018-07-26 NOTE — Progress Notes (Signed)
eLink Physician-Brief Progress Note Patient Name: Kristin Moon DOB: 1946/04/29 MRN: [id number]   Date of Service  07/26/2018  HPI/Events of Note  Potassium is 2.9, creatinine within normal limits and the patient is n.p.o.  eICU Interventions  Ordered 40 mEq of IV potassium replacement.      Intervention Category Intermediate Interventions: Medication change / dose adjustment;Electrolyte abnormality - evaluation and management  Mady Gemma 07/26/2018, 1:45 AM

## 2018-07-26 NOTE — Progress Notes (Addendum)
..   NAME:  Kristin Moon, MRN:  [id number], DOB:  28-May-1946, LOS: 43 ADMISSION DATE:  07/10/2018, CONSULTATION DATE:  07/16/2018 REFERRING MD:  [facility] Hospitalist, CHIEF COMPLAINT:  SOB and fever   Brief History    73 yr old female w/ PMHx of NHL, HLD, HTN, Hypothyroidism, LBBB, CAD G1DD presented from home via EMS after coughing up blood and feeling weak, Tested flu positive at her PMD visit on 07/14/2018 and started on Tamiflu as outpatient but continued to worsen Failed BiPAP treatment in ED, intubated for acute respiratory failure.  Past Medical History  NHL (per son last radiation Rx was 52 yrs ago), HLD, HTN, Hypothyroidism, LBBB, CAD, G1DD (EF 50%) w/ a history of pneumonia last summer/fall not requiring hospitalization.   Significant Hospital Events   2/7 Admit with fluB infection, pneumococcus PNA, intubated 2/10: CT chest ordered to further evaluate pleural space.  Findings consistent with large left and small to moderate right effusion 2/11: Spiking temperature of 104, cultures resent.  Left chest tube placed.  LFTs significantly elevated, ultrasound ordered.  Ultrasound abdomen negative for acute cholecystitis.   2/12: Still having fevers.  Antibiotics widened, added vancomycin and cefepime.  Discontinued fentanyl drip 2/13: Fever curve is down.  White blood cell count has improved.  Culture still pending.  Continuing nosocomial coverage.  Working on addressing sedation regimen.  Tidal volumes are adequate on spontaneous breathing trial but agitation and deconditioning seem to be a major barrier at this point. 07/23/2018 - TRACH by Dr Nelda Marseille 2/15 -  post trach and bronch for trach - LLL atx has resolved. Has bilateral effusions.  Volume  +9.2L since admit (improved from +11.3L 2d ago). In sinus since yesterday and off cardizem. Seen by cards - CHADSVAC 4 but recommend holding anticoag till there is recurrence. A Fib felt due to medical issues. FAmily concnered patient not waknig up - despite  off fent gtt x 4h. On precedex gtt and scheduled klonopin and seroquel. Moves all 4s per RN but mostly uppers and shakes her head   Consults:  PCCM  Procedures:  Endotracheal intubation- 07/16/2018 >> CVC insertion - 07/16/2018 Thoracentesis 2/7 > LDH appriox 1200 Bronch, BAL 2/7 Left chest tube 2/11>>  Significant Diagnostic Tests:  Chest x-ray 2/9- slight improvement in dense left lung consolidation.  I have reviewed the images personally. CT chest 2/10: Bibasilar left greater than right pneumonia/consolidation with moderate to large left effusion and small to moderate right effusion. Right upper quadrant ultrasound 2/11>>> some sludge, no significant distention.  No pericholecystic fluid Micro Data: ( pending)   resp cx 2/16 --> rare GNR >> RVP--> Flu B Influenza swab--> Flu B U pneumococcus 2/7 > positive Blood 2/11>>ng Urine 2/11>>>YEAST  Left pleural fluid 2/11>>>ng ............... Pan culture 2/16 Sepsis biomarkerts 2/16  Antimicrobials:  Vancomycin: 2/6 > 2/7 Cefepime: 2/6 >> 2/10  Ceftriaxone 2/10 >>> 2/12 Tamiflu 2/5 >> completed 5 days Cefepime 2/12>>> Vancomycin 2/12>>>  Subjective     Febrile overnight. Remains critically ill, on low-dose fentanyl drip Good urine output with slight negative balance overnight  Objective   Blood pressure (!) 166/64, pulse (!) 132, temperature (!) 102.6 F (39.2 C), temperature source Oral, resp. rate (!) 32, height _0  (1.575 m), weight 74.3 kg, SpO2 99 %.    Vent Mode: PRVC FiO2 (%):  [40 %] 40 % Set Rate:  [22 bmp] 22 bmp Vt Set:  [350 mL] 350 mL PEEP:  [5 cmH20] 5 cmH20 Pressure Support:  [16  cmH20] [place] Pressure:  [12 cmH20-17 cmH20] 17 cmH20   Intake/Output Summary (Last 24 hours) at 07/26/2018 1158 Last data filed at 07/26/2018 1130 Gross per 24 hour  Intake 3408.67 ml  Output 4200 ml  Net -791.33 ml   Filed Weights   07/24/18 0500 07/25/18 0500 07/26/18 0500  Weight: 76.8 kg 74.7 kg 74.3 kg     Chronically ill-appearing, tracheostomy, no distress No pallor, icterus, no JVD or lymphadenopathy No edema Decreased breath sounds bilateral, faint rhonchi on right, left chest tube, no air leak, minimal drainage last 24 hours S1-S2 distant Soft and nontender abdomen Calm, RA SS +1, nonfocal Right breast larger than left but no discrete masses    Chest x-ray 2/17 personally reviewed, improved aeration left lower lobe, chest tube in position       Septic shock Anion gap metabolic acidosis  AKI  Mild NAG metabolic acidosis 2/2 hyperchloremia from NaCl Hyperkalemia   Assessment & Plan:  73 year old with acute respiratory failure in the setting of flu B infection, Streptococcus pneumonia Deconditioning and anxiety are barriers to weaning and extubation  Acute on chronic - Respiratory failure 2/2 Flu, Streptococcus pneumonia and left exudative effusion s/p chest tube and then  trach 07/23/2018  Plan SBT as able  Chest tube can be discontinued if drainage stays  less than 100 cc    Atrial Fibrillation with RVR - Cardizem gtt initiated 2/13. A Fib resolved 2/14. CHADSVASC 4 - cards recommending anticoag if A fib recurs. ECHO ef 55% on 07/23/2018   Plan Lopressor prn Cards opd followup Anticoagulation if recurs  Flu and pneumococcal pneumonia WBC count increasing slightly with fevers but procalcitonin trending down  Plan Follow-up cultures Day #5 vancomycin and cefepime Dc vanc if cx ermains neg Consider dc CVL if remains febrile    Abnormal LFTs. RUQ [place] w/ some sludge but no stones of pericholecystic fluid.  -lfts continue trending down   Plan Continue to trend LFTs Hold Tylenol    Acute metabolic encephalopathy,. CT head neg 07/24/2018   Plan Off klonopin and Seroquel Goal RASS 0 Wean fent gtt to extent possibl;e Off Precedex gtt   Physical deconditioning Contributing barrier to extubation Plan LTAC consult - family informed  2/16   Hypothyroidism, elevated blood sugar Plan Change to PO Synthroid CBG Q 4 SSI    Anemia of critical illness -Hemoglobin stable Plan - PRBC for hgb </= 6.9gm%       Best practice:  Diet: Tube feeds Pain/Anxiety/Delirium protocol (if indicated):see above VAP protocol (if indicated): yes DVT prophylaxis: Hep [place] and SCDs GI prophylaxis:  Pepcid IV Glucose control: SSI Mobility: continuous bedrest Code Status: FULL Family Communication: family updated Disposition  ICU. Needs LTAC   The patient is critically ill with multiple organ systems failure and requires high complexity decision making for assessment and support, frequent evaluation and titration of therapies, application of advanced monitoring technologies and extensive interpretation of multiple databases. Critical Care Time devoted to patient care services described in this note independent of APP/resident  time is 32 minutes.   Kara Mead MD. Shade Flood. [facility] Pager [phone] If no response call [phone]     07/26/2018 11:58 AM

## 2018-07-26 NOTE — Evaluation (Signed)
Physical Therapy Evaluation Patient Details Name: Kristin Moon MRN: [id number] DOB: 04/19/1946 Today's Date: 07/26/2018   History of Present Illness  73 y.o. female admitted on 07/14/2018 for feeling weak, feverish, coughing up blood.  Tested flu (+) at primary care MD on 07/14/18.  Pt required BiPAP in [facility] ED and was transferred to [facility] for higher level of care.  Pt dx with acute hypoxic respiratory failure due to multilobar PNA, septic shock, acute encephalopathy, metabolic acidosis, acute kidney injury.  Pt was intubated at [facility] ED on 07/18/2018, trach on 2/14 and still on ventilator at time of PT evaluation on 07/26/18.  Pt with other significant PMH of non Hodgkin's lymphoma, transaminitis, L BBB, HTN, diastolic CHF, foot fx surgery.   Clinical Impression  Pt was able to sit with back unsupported multiple times with max assist bed in egress mode.  Pt's VSS remained stable with HR max in the 110s.  Pt did not follow any commands for me, but is moving her arms (per family her legs as well) spontaneously.  Pt will likely need some post acute rehab.  Venue will depend on progress.   PT to follow acutely for deficits listed below.      Follow Up Recommendations CIR    Equipment Recommendations  3in1 (PT);Wheelchair (measurements PT);Wheelchair cushion (measurements PT)    Recommendations for Other Services Rehab consult     Precautions / Restrictions Precautions Precautions: Fall      Mobility  Bed Mobility Overal bed mobility: Needs Assistance Bed Mobility: Supine to Sit     Supine to sit: Max assist;HOB elevated     General bed mobility comments: Max assist to come to unsupported sitting with bed in egress mode.          Balance Overall balance assessment: Needs assistance Sitting-balance support: Feet supported;Bilateral upper extremity supported;Single extremity supported Sitting balance-Leahy Scale: Poor Sitting balance - Comments: max assist to come to  unsupported sitting x4 in bed, attempted to place hands for pull, but pt showing no signs of active pull when assisted up to sitting. Pt able to stay sitting for ~30-45 seconds each time.                                      Pertinent Vitals/Pain Pain Assessment: Faces Faces Pain Scale: No hurt    Home Living Family/patient expects to be discharged to:: Private residence Living Arrangements: Spouse/significant other Available Help at Discharge: Family;Other (Comment)(husband drives a school bus, daughter and son present) Type of Home: House Home Access: Stairs to enter Entrance Stairs-Rails: None Secretary/administrator of Steps: 1 Home Layout: One level Home Equipment: Environmental consultant - 4 wheels      Prior Function Level of Independence: Independent         Comments: Pt still works as a Paediatric nurse   Dominant Hand: Left    Extremity/Trunk Assessment   Upper Extremity Assessment Upper Extremity Assessment: Defer to OT evaluation    Lower Extremity Assessment Lower Extremity Assessment: Generalized weakness(I could not get pt to actively move either leg)    Cervical / Trunk Assessment Cervical / Trunk Assessment: Other exceptions Cervical / Trunk Exceptions: weak, and per family she recently had low back injections  Communication   Communication: Tracheostomy  Cognition Arousal/Alertness: Awake/alert Behavior During Therapy: Flat affect;Restless Overall Cognitive Status: Impaired/Different from baseline Area of  Impairment: Orientation;Attention;Memory;Following commands;Safety/judgement;Awareness;Problem solving                 Orientation Level: Disoriented to;Place;Time;Situation Current Attention Level: Focused Memory: Decreased recall of precautions;Decreased short-term memory Following Commands: Follows one step commands inconsistently;Follows one step commands with increased time Safety/Judgement: Decreased awareness  of safety;Decreased awareness of deficits Awareness: Intellectual Problem Solving: Slow processing;Decreased initiation;Difficulty sequencing;Requires verbal cues;Requires tactile cues General Comments: Pt not following any commands, seems to respond positively to family's voices, animated facial expressions, did not follow any commands for me, but does spontaneously move.              Assessment/Plan    PT Assessment Patient needs continued PT services  PT Problem List Decreased strength;Decreased activity tolerance;Decreased balance;Decreased mobility;Decreased cognition;Decreased knowledge of use of DME;Decreased safety awareness;Decreased knowledge of precautions;Cardiopulmonary status limiting activity       PT Treatment Interventions DME instruction;Gait training;Stair training;Functional mobility training;Therapeutic activities;Therapeutic exercise;Balance training;Neuromuscular re-education;Cognitive remediation;Patient/family education    PT Goals (Current goals can be found in the Care Plan section)  Acute Rehab PT Goals Patient Stated Goal: family wants her to get better and return to her baseline.  PT Goal Formulation: With family Time For Goal Achievement: 08/09/18 Potential to Achieve Goals: Good    Frequency Min 2X/week           AM-PAC PT "6 Clicks" Mobility  Outcome Measure Help needed turning from your back to your side while in a flat bed without using bedrails?: Total Help needed moving from lying on your back to sitting on the side of a flat bed without using bedrails?: Total Help needed moving to and from a bed to a chair (including a wheelchair)?: Total Help needed standing up from a chair using your arms (e.g., wheelchair or bedside chair)?: Total Help needed to walk in hospital room?: Total Help needed climbing 3-5 steps with a railing? : Total 6 Click Score: 6    End of Session Equipment Utilized During Treatment: Oxygen;Other (comment)(PRVC on  ventilator 40% FiO2) Activity Tolerance: Patient limited by fatigue Patient left: in bed;with call bell/phone within reach;with bed alarm set;with nursing/sitter in room;with family/visitor present;Other (comment)(in chair mode) Nurse Communication: Mobility status PT Visit Diagnosis: Muscle weakness (generalized) (M62.81);Difficulty in walking, not elsewhere classified (R26.2)    Time: 1240-1320 PT Time Calculation (min) (ACUTE ONLY): 40 min   Charges:          Lurena Joiner B. Sargun Rummell, PT, DPT  Acute Rehabilitation #[phone] pager #[phone] office   PT Evaluation $PT Eval Moderate Complexity: 1 Mod PT Treatments $Therapeutic Activity: 23-37 mins        07/26/2018, 6:00 PM

## 2018-07-26 NOTE — Progress Notes (Addendum)
Progress Note  Patient Name: Kristin Moon Date of Encounter: 07/26/2018  Primary Cardiologist:New  Subjective   Pt with trach   Sedated   Awake at times    Inpatient Medications    Scheduled Meds: . albuterol  2.5 mg Nebulization Q4H  . chlorhexidine gluconate (MEDLINE KIT)  15 mL Mouth Rinse BID  . Chlorhexidine Gluconate Cloth  6 each Topical Daily  . clotrimazole  1 Applicatorful Vaginal QHS  . famotidine  20 mg Per Tube BID  . heparin  5,000 Units Subcutaneous Q8H  . insulin aspart  0-20 Units Subcutaneous Q4H  . insulin aspart  4 Units Subcutaneous Q4H  . levothyroxine  37.5 mcg Intravenous Daily  . mouth rinse  15 mL Mouth Rinse 10 times per day  . metoprolol tartrate  5 mg Intravenous Q6H  . sodium chloride flush  10-40 mL Intracatheter Q12H   Continuous Infusions: . sodium chloride Stopped (07/25/18 1249)  . sodium chloride Stopped (07/26/18 1007)  . ceFEPime (MAXIPIME) IV Stopped (07/26/18 0239)  . dexmedetomidine (PRECEDEX) IV infusion Stopped (07/25/18 1900)  . feeding supplement (VITAL AF 1.2 CAL) 65 mL/hr at 07/26/18 0800  . fentaNYL infusion INTRAVENOUS 50 mcg/hr (07/26/18 1100)  . fluconazole (DIFLUCAN) IV Stopped (07/25/18 2345)  . sodium chloride    . vancomycin Stopped (07/26/18 0704)   PRN Meds: sodium chloride, sodium chloride, fentaNYL, fentaNYL (SUBLIMAZE) injection, fentaNYL (SUBLIMAZE) injection, ibuprofen, midazolam, polyethylene glycol, sennosides, sodium chloride flush   Vital Signs    Vitals:   07/26/18 0900 07/26/18 1000 07/26/18 1100 07/26/18 1121  BP: (!) 156/71 (!) 163/83 (!) 166/64   Pulse: (!) 133 (!) 132 (!) 132   Resp: (!) 29 (!) 37 (!) 32   Temp:    (!) 102.6 F (39.2 C)  TempSrc:    Oral  SpO2: 98% 98% 99%   Weight:      Height:        Intake/Output Summary (Last 24 hours) at 07/26/2018 1140 Last data filed at 07/26/2018 1130 Gross per 24 hour  Intake 3408.67 ml  Output 4200 ml  Net -791.33 ml   Last 3 Weights  07/26/2018 07/25/2018 07/24/2018  Weight (lbs) 163 lb 12.8 oz 164 lb 10.9 oz 169 lb 5 oz  Weight (kg) 74.3 kg 74.7 kg 76.8 kg      Telemetry    ST  110s to 130 - Personally Reviewed  ECG      Physical Exam   GEN: No acute distress.   Neck: JVP appears to be increased  Cardiac: RRR, no murmurs, rubs, or gallops.  Respiratory: Coarse rhonchi   GI: Soft, nontender, non-distended  MS: No edema; No deformity. Neuro:  Opens eyes   Psych: Normal affect   Labs    Chemistry Recent Labs  Lab 07/23/18 0253 07/24/18 0422 07/25/18 0539 07/26/18 0016 07/26/18 0420  NA 142 141  --  145 145  K 3.5 3.5  --  2.9* 3.3*  CL 105 103  --  102 104  CO2 28 30  --  33* 34*  GLUCOSE 183* 163*  --  202* 192*  BUN 30* 29*  --  36* 35*  CREATININE 0.69 0.70  --  0.79 0.67  CALCIUM 9.4 9.0  --  9.5 9.6  PROT 5.2* 5.1* 5.9*  --   --   ALBUMIN 1.6* 1.7* 1.9*  --   --   AST 139* 83* 52*  --   --   ALT 649* 430*  264*  --   --   ALKPHOS 166* 204* 249*  --   --   BILITOT 0.9 0.6 0.8  --   --   GFRNONAA >60 >60  --  >60 >60  GFRAA >60 >60  --  >60 >60  ANIONGAP 9 8  --  10 7     Hematology Recent Labs  Lab 07/24/18 0422 07/25/18 1540 07/26/18 0420  WBC 10.6* 9.8 13.3*  RBC 3.59* 3.43* 3.69*  HGB 9.1* 8.6* 9.7*  HCT 30.8* 29.9* 32.1*  MCV 85.8 87.2 87.0  MCH 25.3* 25.1* 26.3  MCHC 29.5* 28.8* 30.2  RDW 20.6* 20.6* 20.4*  PLT 262 284 356    Cardiac Enzymes Recent Labs  Lab 07/22/18 1515 07/22/18 2203 07/23/18 0253 07/23/18 0830  TROPONINI 0.05* 0.06* 0.10* 0.10*   No results for input(s): TROPIPOC in the last 168 hours.   BNPNo results for input(s): BNP, PROBNP in the last 168 hours.   DDimer No results for input(s): DDIMER in the last 168 hours.   Radiology    Ct Head Wo Contrast  Result Date: 07/24/2018 CLINICAL DATA:  Encephalopathy. EXAM: CT HEAD WITHOUT CONTRAST TECHNIQUE: Contiguous axial images were obtained from the base of the skull through the vertex without  intravenous contrast. COMPARISON:  None. FINDINGS: Brain: There is no evidence of acute infarct, intracranial hemorrhage, mass, midline shift, or extra-axial fluid collection. The ventricles and sulci are within normal limits for age. Vascular: No hyperdense vessel. Skull: No fracture or focal osseous lesion. Sinuses/Orbits: Mild sphenoid sinus mucosal thickening. Small bilateral mastoid effusions. Unremarkable orbits. Other: Partially visualized nasoenteric tube. IMPRESSION: Unremarkable CT appearance of the brain. Electronically Signed   By: Logan Bores M.D.   On: 07/24/2018 21:45   Dg Chest Port 1 View  Result Date: 07/26/2018 CLINICAL DATA:  Tracheostomy.  Left chest tube. EXAM: PORTABLE CHEST 1 VIEW COMPARISON:  07/24/2018 FINDINGS: Tracheostomy, soft feeding tube, left chest tube and right arm PICC remain in place, unchanged. No left pleural air is visible. Slight improvement in aeration of both lower lobes. IMPRESSION: Lines and tubes unchanged in satisfactory. No visible left pneumothorax. Slightly improved lower lobe aeration. Electronically Signed   By: Nelson Chimes M.D.   On: 07/26/2018 06:32      Patient Profile     73 y.o. female with  Hx of diastlic CHF, HTN, HL, LBBB minimal CAD on cath 2012   Consulted on 2/14 for atrial fibrillation     Assessment & Plan    1Afib    No recurrence   Would follow for now and not add anticoagulation unless recurs  2   Troponin elevation   No evid of active ischemia  3   Acute on chronic diastolic CHF   Albumin is 1.9 yesteday   Allows for sign 3rd spacing of lfuid Pt has diuresed signfi since Friday  (now only 6 L positive)    I would hold further lasix for now    Follow HR   130s  (ST)   BUN/CR have not bumped much      Would swithc to po metorpolol and give via feeding tube  Follow BP and HR   4Thryoid  TSH was a little low   Will switch to 25 IV (was on 37.5 IV previously )    For questions or updates, please contact [facility] Please  consult [email or web address] for contact info under        Signed, Dorris Carnes, MD  07/26/2018,  11:40 AM

## 2018-07-26 NOTE — Care Management Note (Signed)
Case Management Note Manya Silvas, RN MSN CCM Transitions of Care 26M [place] [phone]  Patient Details  Name: Kristin Moon MRN: [id number] Date of Birth: 03/04/1946  Subjective/Objective:         Influenza           Action/Plan: Spoke with family at patient bedside. PTA home with husband. Independent. Working on Tuesdays. Driving. Managing medications and medical appointments without difficulty. No DME/HH services. Discussed CM following patient progression and will assist with transition of care needs when patient ready.   Expected Discharge Date:                  Expected Discharge Plan:  Home/Self Care  In-House Referral:  Clinical Social Work  Discharge planning Services  CM Consult  Post Acute Care Choice:    Choice offered to:     DME Arranged:    DME Agency:     HH Arranged:    HH Agency:     Status of Service:  In process, will continue to follow  If discussed at Long Length of Stay Meetings, dates discussed:    Additional Comments:  Bartholomew Crews, RN 07/26/2018, 3:04 PM

## 2018-07-26 NOTE — Progress Notes (Signed)
Pharmacy Antibiotic Note  Kristin Moon is a 73 y.o. female admitted on 07/28/2018 with fluB and S. pneumoniae pneumona treated with Tamiflu and cefepime>>ceftriaxone. She continued to have elevated temperatures, up to 104.2 and bilateral pleural effusions s/p L chest tube placement with concern for acalculous cholecystitis. Pharmacy has been consulted for vancomycin and cefepime dosing. A random vancomycin level this AM is therapeutic at 15  Plan: Will restart vancomycin at 725m IV Q12H F/u renal fxn, C&S, clinical status and peak/trough at SS  Height: _0  (157.5 cm) Weight: 164 lb 10.9 oz (74.7 kg) IBW/kg (Calculated) : 50.1  Temp (24hrs), Avg:99.7 F (37.6 C), Min:98.9 F (37.2 C), Max:100.2 F (37.9 C)  Recent Labs  Lab 07/22/18 0440 07/23/18 0253 07/23/18 0944 07/24/18 0422 07/25/18 1228 07/25/18 1540 07/25/18 1544 07/26/18 0016 07/26/18 0420  WBC 8.4  --  10.7* 10.6*  --  9.8  --   --  13.3*  CREATININE 0.61 0.69  --  0.70  --   --   --  0.79 0.67  LATICACIDVEN  --   --   --   --  1.3  --   --  1.1  --   VANCOPEAK  --   --   --   --   --   --  52*  --   --   VANCORANDOM  --   --   --   --   --   --   --   --  15    Estimated Creatinine Clearance: 61.9 mL/min (by C-G formula based on SCr of 0.67 mg/dL).    Allergies  Allergen Reactions  . Metoprolol     fatigue   Antimicrobials this admission: Vancomycin 2/6 >> 2/10; 2/12 >> Cefepime 2/6 >> 2/10; 2/12 >> Metronidazole 2/6 >> 2/7 Tamiflu 2/5 (OP) >> 2/10 CTX 2/10 >> 2/12  Microbiology results: 2/6 BCx: NG x3 2/7 RVP: fluB+ 2/7 MRSA PCR: neg 2/7 S. pneumo antigen: positive 2/7 BAL: NG final 2/7 pleural cx: normal flora 2/11 pleural cx: NG final 2/11 Bcx: NG final 2/11 TA cx: neg 2/11 Ucx: >100K yeast  Thank you for allowing pharmacy to be a part of this patient's care.  RSalome Arnt PharmD, BCPS Please see AMION for all pharmacy numbers 07/26/2018 5:12 AM

## 2018-07-26 NOTE — Progress Notes (Signed)
Inpatient Diabetes Program Recommendations  AACE/ADA: New Consensus Statement on Inpatient Glycemic Control  Target Ranges:  Prepandial:   less than 140 mg/dL      Peak postprandial:   less than 180 mg/dL (1-2 hours)      Critically ill patients:  140 - 180 mg/dL   Results for Kristin Moon, Kristin Moon (MRN [id number]) as of 07/26/2018 13:51  Ref. Range 07/25/2018 08:18 07/25/2018 11:57 07/25/2018 15:48 07/25/2018 19:47 07/25/2018 23:51 07/26/2018 03:54 07/26/2018 08:09 07/26/2018 11:19  Glucose-Capillary Latest Ref Range: 70 - 99 mg/dL 190 (H)  Novolog 8 units 221 (H)  Novolog 11 units 197 (H)  Novolog 8 units 230 (H)  Novolog 11 units 189 (H)  Novolog 8 units 194 (H)  Novolog 8 units 207 (H)  Novolog 11 units 199 (H)  Novolog 8 units   Review of Glycemic Control  Current orders for Inpatient glycemic control: Novolog 4 units Q4H for tube feeding coverage, Novolog 0-20 units Q4H  Inpatient Diabetes Program Recommendations:  Insulin - Tube Feeding Coverage: Please consider increasing tube feeding coverage to Novolog 8 units Q4H.  Thanks, Barnie Alderman, RN, MSN, CDE Diabetes Coordinator Inpatient Diabetes Program [phone] (Team Pager from 8am to 5pm)

## 2018-07-27 ENCOUNTER — Inpatient Hospital Stay (HOSPITAL_COMMUNITY): Payer: Medicare Other

## 2018-07-27 LAB — BASIC METABOLIC PANEL
Anion gap: 8 (ref 5–15)
BUN: 39 mg/dL — ABNORMAL HIGH (ref 8–23)
CHLORIDE: 110 mmol/L (ref 98–111)
CO2: 30 mmol/L (ref 22–32)
CREATININE: 0.83 mg/dL (ref 0.44–1.00)
Calcium: 10.1 mg/dL (ref 8.9–10.3)
GFR calc Af Amer: >60 mL/min (ref >60)
GFR calc non Af Amer: >60 mL/min (ref >60)
Glucose, Bld: 236 mg/dL — ABNORMAL HIGH (ref 70–99)
Potassium: 3.4 mmol/L — ABNORMAL LOW (ref 3.5–5.1)
Sodium: 148 mmol/L — ABNORMAL HIGH (ref 135–145)

## 2018-07-27 LAB — CBC WITH DIFFERENTIAL/PLATELET
Abs Immature Granulocytes: 0.2 10*3/uL — ABNORMAL HIGH (ref 0.00–0.07)
BASOS ABS: 0 10*3/uL (ref 0.0–0.1)
BASOS PCT: 0 %
Eosinophils Absolute: 0 10*3/uL (ref 0.0–0.5)
Eosinophils Relative: 0 %
HCT: 32.5 % — ABNORMAL LOW (ref 36.0–46.0)
Hemoglobin: 9.5 g/dL — ABNORMAL LOW (ref 12.0–15.0)
Immature Granulocytes: 2 %
Lymphocytes Relative: 11 %
Lymphs Abs: 1.3 10*3/uL (ref 0.7–4.0)
MCH: 26.1 pg (ref 26.0–34.0)
MCHC: 29.2 g/dL — ABNORMAL LOW (ref 30.0–36.0)
MCV: 89.3 fL (ref 80.0–100.0)
Monocytes Absolute: 0.9 10*3/uL (ref 0.1–1.0)
Monocytes Relative: 8 %
Neutro Abs: 9.2 10*3/uL — ABNORMAL HIGH (ref 1.7–7.7)
Neutrophils Relative %: 79 %
Platelets: 445 10*3/uL — ABNORMAL HIGH (ref 150–400)
RBC: 3.64 MIL/uL — ABNORMAL LOW (ref 3.87–5.11)
RDW: 20.9 % — ABNORMAL HIGH (ref 11.5–15.5)
WBC: 11.7 10*3/uL — ABNORMAL HIGH (ref 4.0–10.5)
nRBC: 0.2 % (ref 0.0–0.2)

## 2018-07-27 LAB — GLUCOSE, CAPILLARY
Glucose-Capillary: 160 mg/dL — ABNORMAL HIGH (ref 70–99)
Glucose-Capillary: 179 mg/dL — ABNORMAL HIGH (ref 70–99)
Glucose-Capillary: 198 mg/dL — ABNORMAL HIGH (ref 70–99)
Glucose-Capillary: 218 mg/dL — ABNORMAL HIGH (ref 70–99)
Glucose-Capillary: 225 mg/dL — ABNORMAL HIGH (ref 70–99)
Glucose-Capillary: 226 mg/dL — ABNORMAL HIGH (ref 70–99)
Glucose-Capillary: 252 mg/dL — ABNORMAL HIGH (ref 70–99)

## 2018-07-27 LAB — CULTURE, RESPIRATORY W GRAM STAIN
Culture: NORMAL
Special Requests: NORMAL

## 2018-07-27 LAB — MAGNESIUM: Magnesium: 2.6 mg/dL — ABNORMAL HIGH (ref 1.7–2.4)

## 2018-07-27 LAB — PROCALCITONIN: Procalcitonin: 0.79 ng/mL

## 2018-07-27 LAB — PHOSPHORUS: Phosphorus: 2.4 mg/dL — ABNORMAL LOW (ref 2.5–4.6)

## 2018-07-27 MED ORDER — POTASSIUM CHLORIDE 20 MEQ/15ML (10%) PO SOLN
40.0000 meq | Freq: Once | ORAL | Status: AC
  Administered 2018-07-27: 40 meq
  Filled 2018-07-27: qty 30

## 2018-07-27 MED ORDER — INSULIN DETEMIR 100 UNIT/ML ~~LOC~~ SOLN
5.0000 [IU] | Freq: Two times a day (BID) | SUBCUTANEOUS | Status: DC
  Administered 2018-07-27 – 2018-07-29 (×5): 5 [IU] via SUBCUTANEOUS
  Filled 2018-07-27 (×6): qty 0.05

## 2018-07-27 MED ORDER — FUROSEMIDE 10 MG/ML IJ SOLN
40.0000 mg | Freq: Once | INTRAMUSCULAR | Status: AC
  Administered 2018-07-27: 40 mg via INTRAVENOUS
  Filled 2018-07-27: qty 4

## 2018-07-27 MED ORDER — SODIUM CHLORIDE 0.9 % IV BOLUS
500.0000 mL | Freq: Once | INTRAVENOUS | Status: AC
  Administered 2018-07-27: 500 mL via INTRAVENOUS

## 2018-07-27 NOTE — Progress Notes (Signed)
 Progress Note  Patient Name: Kristin Moon Date of Encounter: 07/27/2018  Primary Cardiologist:New  Subjective   Pt with trach   Sitting in bed   Awake    Inpatient Medications    Scheduled Meds: . albuterol  2.5 mg Nebulization Q4H  . chlorhexidine gluconate (MEDLINE KIT)  15 mL Mouth Rinse BID  . Chlorhexidine Gluconate Cloth  6 each Topical Daily  . clotrimazole  1 Applicatorful Vaginal QHS  . famotidine  20 mg Per Tube BID  . heparin  5,000 Units Subcutaneous Q8H  . insulin aspart  0-20 Units Subcutaneous Q4H  . insulin aspart  4 Units Subcutaneous Q4H  . levothyroxine  50 mcg Oral Q0600  . mouth rinse  15 mL Mouth Rinse 10 times per day  . metoprolol tartrate  25 mg Per Tube TID  . sodium chloride flush  10-40 mL Intracatheter Q12H   Continuous Infusions: . sodium chloride Stopped (07/25/18 1249)  . sodium chloride Stopped (07/27/18 0524)  . ceFEPime (MAXIPIME) IV Stopped (07/27/18 0250)  . dexmedetomidine (PRECEDEX) IV infusion Stopped (07/25/18 1900)  . feeding supplement (VITAL AF 1.2 CAL) 1,000 mL (07/26/18 1644)  . fentaNYL infusion INTRAVENOUS 75 mcg/hr (07/27/18 0600)  . fluconazole (DIFLUCAN) IV Stopped (07/26/18 2239)  . sodium chloride    . vancomycin 150 mL/hr at 07/27/18 0600   PRN Meds: sodium chloride, sodium chloride, acetaminophen (TYLENOL) oral liquid 160 mg/5 mL, fentaNYL, fentaNYL (SUBLIMAZE) injection, fentaNYL (SUBLIMAZE) injection, ibuprofen, midazolam, polyethylene glycol, sennosides, sodium chloride flush   Vital Signs    Vitals:   07/27/18 0314 07/27/18 0325 07/27/18 0400 07/27/18 0500  BP:   (!) 144/65 (!) 148/63  Pulse:  (!) 122 (!) 123 (!) 118  Resp:  (!) 26 (!) 26 (!) 21  Temp: (!) 101.1 F (38.4 C)   (!) 100.5 F (38.1 C)  TempSrc: Rectal   Rectal  SpO2:  99% 99% 100%  Weight:    75.9 kg  Height:        Intake/Output Summary (Last 24 hours) at 07/27/2018 0828 Last data filed at 07/27/2018 0600 Gross per 24 hour  Intake  2956.21 ml  Output 1560 ml  Net 1396.21 ml   Last 3 Weights 07/27/2018 07/26/2018 07/25/2018  Weight (lbs) 167 lb 5.3 oz 163 lb 12.8 oz 164 lb 10.9 oz  Weight (kg) 75.9 kg 74.3 kg 74.7 kg      Telemetry    ST 100s - Personally Reviewed  ECG      Physical Exam   GEN: No acute distress.   Neck: JVP difficult to assess with trach   Cardiac: RRR, no murmurs, rubs, or gallops.  Respiratory: Rel clear   GI: Soft, nontender, non-distended  MS: Tr edema; No deformity. Neuro:  Deferred  Labs    Chemistry Recent Labs  Lab 07/23/18 0253 07/24/18 0422 07/25/18 0539 07/26/18 0016 07/26/18 0420 07/27/18 0500  NA 142 141  --  145 145 148*  K 3.5 3.5  --  2.9* 3.3* 3.4*  CL 105 103  --  102 104 110  CO2 28 30  --  33* 34* 30  GLUCOSE 183* 163*  --  202* 192* 236*  BUN 30* 29*  --  36* 35* 39*  CREATININE 0.69 0.70  --  0.79 0.67 0.83  CALCIUM 9.4 9.0  --  9.5 9.6 10.1  PROT 5.2* 5.1* 5.9*  --   --   --   ALBUMIN 1.6* 1.7* 1.9*  --   --   --     AST 139* 83* 52*  --   --   --   ALT 649* 430* 264*  --   --   --   ALKPHOS 166* 204* 249*  --   --   --   BILITOT 0.9 0.6 0.8  --   --   --   GFRNONAA >60 >60  --  >60 >60 >60  GFRAA >60 >60  --  >60 >60 >60  ANIONGAP 9 8  --  10 7 8     Hematology Recent Labs  Lab 07/25/18 1540 07/26/18 0420 07/27/18 0500  WBC 9.8 13.3* 11.7*  RBC 3.43* 3.69* 3.64*  HGB 8.6* 9.7* 9.5*  HCT 29.9* 32.1* 32.5*  MCV 87.2 87.0 89.3  MCH 25.1* 26.3 26.1  MCHC 28.8* 30.2 29.2*  RDW 20.6* 20.4* 20.9*  PLT 284 356 445*    Cardiac Enzymes Recent Labs  Lab 07/22/18 1515 07/22/18 2203 07/23/18 0253 07/23/18 0830  TROPONINI 0.05* 0.06* 0.10* 0.10*   No results for input(s): TROPIPOC in the last 168 hours.   BNPNo results for input(s): BNP, PROBNP in the last 168 hours.   DDimer No results for input(s): DDIMER in the last 168 hours.   Radiology    Dg Chest Port 1 View  Result Date: 07/27/2018 CLINICAL DATA:  Respiratory failure. EXAM:  PORTABLE CHEST 1 VIEW COMPARISON:  07/26/2018.  07/24/2017. FINDINGS: Tracheostomy tube, feeding tube, right PICC line in stable position. Left chest tube in stable position. Heart size stable. Bibasilar atelectasis/infiltrates again noted. No pleural effusion. Tiny left apical pneumothorax noted on today's exam. IMPRESSION: 1. Left chest tube in stable position. Tiny left apical pneumothorax noted on today's exam. 2.  Remaining lines and tubes stable position. 3.  Bibasilar atelectasis/infiltrates again noted. Critical Value/emergent results were called by telephone at the time of interpretation on 07/27/2018 at 7:32 am to nurse Cora, who verbally acknowledged these results. Electronically Signed   By: Thomas  Register   On: 07/27/2018 07:33   Dg Chest Port 1 View  Result Date: 07/26/2018 CLINICAL DATA:  Tracheostomy.  Left chest tube. EXAM: PORTABLE CHEST 1 VIEW COMPARISON:  07/24/2018 FINDINGS: Tracheostomy, soft feeding tube, left chest tube and right arm PICC remain in place, unchanged. No left pleural air is visible. Slight improvement in aeration of both lower lobes. IMPRESSION: Lines and tubes unchanged in satisfactory. No visible left pneumothorax. Slightly improved lower lobe aeration. Electronically Signed   By: Mark  Shogry M.D.   On: 07/26/2018 06:32      Patient Profile     72 y.o. female with  Hx of diastlic CHF, HTN, HL, LBBB minimal CAD on cath 2012   Consulted on 2/14 for atrial fibrillation     Assessment & Plan    1Afib   No recurreance  Will increase b blocker to 25 q 6 hours   May be elevated with fever  2   Acute on chronic diastolic CHF   Volume is up but  not too bad   Follow for now   WOuld not resume lasix yet until HR improves   4Thryoid  TSH was a little low   Will switch to 25 IV (was on 37.5 IV previously )      For questions or updates, please contact [facility] Please consult [email or web address] for contact info under        Signed,  , MD    07/27/2018, 8:28 AM    

## 2018-07-27 NOTE — Progress Notes (Signed)
Inpatient Diabetes Program Recommendations  AACE/ADA: New Consensus Statement on Inpatient Glycemic Control (2015)  Target Ranges:  Prepandial:   less than 140 mg/dL      Peak postprandial:   less than 180 mg/dL (1-2 hours)      Critically ill patients:  140 - 180 mg/dL   Lab Results  Component Value Date   GLUCAP 218 (H) 07/27/2018    Review of Glycemic Control Results for Kristin Moon, Kristin Moon (MRN [id number]) as of 07/27/2018 09:47  Ref. Range 07/26/2018 08:09 07/26/2018 11:19 07/26/2018 15:21 07/26/2018 20:01 07/26/2018 23:55 07/27/2018 03:13 07/27/2018 07:50  Glucose-Capillary Latest Ref Range: 70 - 99 mg/dL 207 (H) 199 (H) 206 (H) 235 (H) 252 (H) 226 (H) 218 (H)   Current orders for Inpatient glycemic control: Novolog 4 units Q4H for tube feeding coverage, Novolog 0-20 units Q4H  Inpatient Diabetes Program Recommendations:  Insulin - Tube Feeding Coverage: Please consider increasing tube feeding coverage to Novolog 8 units Q4H.  Thank you, Nani Gasser. Finbar Nippert, RN, MSN, CDE  Diabetes Coordinator Inpatient Glycemic Control Team Team Pager [phone] (8am-5pm) 07/27/2018 9:47 AM

## 2018-07-27 NOTE — Progress Notes (Addendum)
..   NAME:  Kristin Moon, MRN:  [id number], DOB:  06-09-1946, LOS: 14 ADMISSION DATE:  07/14/2018, CONSULTATION DATE:  07/16/2018 REFERRING MD:  [facility] Hospitalist, CHIEF COMPLAINT:  SOB and fever   Brief History    73 yr old female w/ PMHx of NHL, HLD, HTN, Hypothyroidism, LBBB, CAD G1DD presented from home via EMS after coughing up blood and feeling weak, Tested flu positive at her PMD visit on 07/14/2018 and started on Tamiflu as outpatient but continued to worsen Failed BiPAP treatment in ED, intubated for acute respiratory failure.  Past Medical History  NHL (per son last radiation Rx was 15 yrs ago), HLD, HTN, Hypothyroidism, LBBB, CAD, G1DD (EF 50%) w/ a history of pneumonia last summer/fall not requiring hospitalization.   Significant Hospital Events   2/7 Admit with fluB infection, pneumococcus PNA, intubated 2/10: CT chest ordered to further evaluate pleural space.  Findings consistent with large left and small to moderate right effusion 2/11: Spiking temperature of 104, cultures resent.  Left chest tube placed.  LFTs significantly elevated, ultrasound ordered.  Ultrasound abdomen negative for acute cholecystitis.   2/12: Still having fevers.  Antibiotics widened, added vancomycin and cefepime.  Discontinued fentanyl drip 2/13: Fever curve is down.  White blood cell count has improved.  Culture still pending.  Continuing nosocomial coverage.  Working on addressing sedation regimen.  Tidal volumes are adequate on spontaneous breathing trial but agitation and deconditioning seem to be a major barrier at this point. 07/23/2018 - TRACH by Dr Nelda Marseille 2/15 -  post trach and bronch for trach - LLL atx has resolved. Has bilateral effusions.  Volume  +9.2L since admit (improved from +11.3L 2d ago). In sinus since yesterday and off cardizem. Seen by cards - CHADSVAC 4 but recommend holding anticoag till there is recurrence. A Fib felt due to medical issues. Family concnered patient not waking up - despite  off fent gtt x 4h. On precedex gtt and scheduled klonopin and seroquel. Moves all 4s per RN but mostly uppers and shakes her head 07/25/2018 - volume down to +7.4L. Febrile again 101F . Maintained on fent gtt (currently holiday) with precedex gtt on holiday -> tachypneic and need sedation but able to track family and squeeze. Diffusely weak.  CT head negative 2/17 ongoing fever, neg I/O balance    Consults:  PCCM  Procedures:  Endotracheal intubation- 07/16/2018 >> CVC insertion - 07/16/2018 >> removed  Thoracentesis 2/7 > LDH appriox 1200 Bronch, BAL 2/7 Left chest tube 2/11>> R PICC 2/13 >> Trach 2/14 >>  Significant Diagnostic Tests:  Chest x-ray 2/9- slight improvement in dense left lung consolidation.  I have reviewed the images personally. CT chest 2/10: Bibasilar left greater than right pneumonia/consolidation with moderate to large left effusion and small to moderate right effusion. Right upper quadrant ultrasound 2/11>>> some sludge, no significant distention.  No pericholecystic fluid CTH 2/15 >> neg  Micro Data: ( pending)  MRSA PCR 2/7 >> neg Urine strep ag 2/7>> positive  BC 2/6 >> neg RVP 2/7 --> Flu B BAL 2/7 >> Left pleural fluid cx 2/7 >> Influenza swab--> Flu B Blood 2/11>>ng Urine 2/11>>>YEAST >100K Left pleural fluid 2/11>>>ng ............... Trach asp 2/11 >>normal flora  Trach asp 2/16 >>  BC x 2 2/16 >>   Antimicrobials:  Vancomycin: 2/6 > 2/7; 2/12 >> Cefepime: 2/6 >> 2/10; 2/12 >> Ceftriaxone 2/10 >>> 2/12 Tamiflu 2/5 >> completed 5 days Flagyl 2/16 >>  Subjective    Tmax 103.2, better  WBC Patient is wide awake, mouthing words, f/c- has no complaints Weaning well today on PSV- going to ATC Good UOP, +838/ net + 8.1 No reported output from Left CT incidental small left apical ptx noted on CXR  Remains on fent gtt 25 mcg/hr - for gagging episodes per RN  Objective   Blood pressure (!) 144/61, pulse (!) 108, temperature 97.9 F (36.6 C),  temperature source Axillary, resp. rate 19, height _0  (1.575 m), weight 75.9 kg, SpO2 100 %.    Vent Mode: PSV;CPAP FiO2 (%):  [40 %] 40 % Set Rate:  [22 bmp] 22 bmp Vt Set:  [350 mL] 350 mL PEEP:  [5 cmH20] 5 cmH20 Pressure Support:  [10 cmH20] 10 cmH20 Plateau Pressure:  [14 cmH20-17 cmH20] 15 cmH20   Intake/Output Summary (Last 24 hours) at 07/27/2018 1121 Last data filed at 07/27/2018 0600 Gross per 24 hour  Intake 2724.93 ml  Output 1160 ml  Net 1564.93 ml   Filed Weights   07/25/18 0500 07/26/18 0500 07/27/18 0500  Weight: 74.7 kg 74.3 kg 75.9 kg   General:  Chronically ill appearing older female sitting upright in bed in NAD HEENT: MM pink/moist, pupils 3/reactive, anicteric, midline 6 cuffed shiley trach- sutured- clean, cortrak Neuro: Awake, f/c, mouthing words, generalized weakness  CV: ST, no murmur PULM: even/non-labored on PSV 5/5, lungs bilaterally coarse, left CT in place to 20cm suction, no leak, scant clear serosang drainage  GI: obese, mild distention, denies pain, +BS, last BM this morning Extremities: cool /dry, +1 generalized edema Skin: no rashes     Septic shock Anion gap metabolic acidosis  AKI  Mild NAG metabolic acidosis 2/2 hyperchloremia from NaCl Hyperkalemia   Assessment & Plan:  73 year old with acute respiratory failure in the setting of flu B infection, Streptococcus pneumonia Deconditioning and anxiety have been barriers to weaning and extubation  Acute on chronic - Respiratory failure 2/2 Flu, Streptococcus pneumonia and left exudative effusion s/p chest tube and then  trach 07/23/2018 - New small left apical PTX - CXR 2/18 with new small apical ptx, stable line/tubes, stable bibasilar atelectasis/ infiltrates  - CT drainage 260- > 60 > less than 10 ml today Plan Full MV support as needed Minimal O2 / PEEP support Daily SBT/ ATC trials  Place CT to water seal, repeat CXR in 6 hours to evaluate PTX or sooner if becomes symptomatic    Aggressive pulm hygiene Consult PT/ OT Ongoing gentle diuresis as BP/ renal tolerated  Atrial Fibrillation with RVR - Cardizem gtt initiated 2/13. A Fib resolved 2/14. CHADSVASC 4 - cards recommending anticoag if A fib recurs. ECHO ef 55% on 07/23/2018 Plan Remains in SR Lopressor prn Cards outpatient followup Anticoagulation if recurs   Flu and pneumococcal pneumonia Ongoing fever Improving WBC/ down trending PCT - 7.15 (2/11) -> 1.06-> 0.99 -> 0.79 Plan Unclear etiology of ongoing fevers; mental status has improved  Following cultures- since have been neg S/p day 7  vancomycin and cefepime- this is after prior 4 days of abx therapy Flagyl started 2/16 for yeast on UC Will stop abx for now and monitor clinically  Will d/c foley -> to purwick   Abnormal LFTs. RUQ [place] w/ some sludge but no stones of pericholecystic fluid.  -lfts continue trending down - abd exam is neg  Plan Continue to trend LFTs Hold Tylenol  Acute metabolic encephalopathy,. CT head neg 07/24/2018- resolved Plan On low dose fent gtt for gagging episodes, will stop and change to  PRN    Physical deconditioning Contributing barrier to extubation Plan LTAC consult - family informed 2/16 however, family has told nursing staff they do not want her to go to [facility]   Hypothyroidism, elevated blood sugar Plan Synthroid per tube CBG Q 4 SSI- resistant TF coverage 4 units q 4 Add levemir 5 units BID   Anemia of critical illness -Hemoglobin stable Plan Trend CBC/ transfuse for hgb <7    Best practice:  Diet: Tube feeds Pain/Anxiety/Delirium protocol (if indicated):see above VAP protocol (if indicated): yes DVT prophylaxis: Hep [place] and SCDs GI prophylaxis:  Pepcid  Glucose control: as above Mobility: progress as able Code Status: FULL Family Communication: patient, her son and her brother updated at bedside Disposition  ICU. Needs LTAC    Kennieth Rad, MSN, AGACNP-BC [facility] Pgr: [phone] or if no answer [phone] 07/27/2018, 11:22 AM

## 2018-07-27 NOTE — Progress Notes (Signed)
eLink Physician-Brief Progress Note Patient Name: Kristin Moon DOB: 1945/11/24 MRN: [id number]   Date of Service  07/27/2018  HPI/Events of Note  Multiple issues: 1. Fever to 103.2 F in spite of Tylenol and Ibuprofen. Request for cooling blanket and 2. Sinus Tachycardia - HR = 123. Currently on Vancomycin, Cefepime and Diflucan.   eICU Interventions  Will order: 1. Cooling blanket PRN.  2. Bolus with 0.9 NaCl 500 mL IV over 30 minutes now.     Intervention Category Major Interventions: Infection - evaluation and management;Hypovolemia - evaluation and treatment with fluids  Rafeal Skibicki Cornelia Copa 07/27/2018, 2:33 AM

## 2018-07-27 NOTE — Progress Notes (Signed)
SLP Cancellation Note  Patient Details Name: Kristin Moon MRN: [id number] DOB: 30-Sep-1945   Cancelled treatment:       Reason Eval/Treat Not Completed: Medical issues which prohibited therapy. Patient remains on vent however per RN, patient becoming more alert, may attempt ATC trials this afternoon however waiting for MD to evaluate. SLP following along for readiness for PMV evaluation. Will continue to f/u.   Gabriel Rainwater MA, CCC-SLP     Kamesha Herne Meryl 07/27/2018, 9:29 AM

## 2018-07-27 NOTE — Evaluation (Signed)
Occupational Therapy Evaluation Patient Details Name: Kristin Moon MRN: [id number] DOB: 09/18/1945 Today's Date: 07/27/2018    History of Present Illness 73 y.o. female admitted on 07/26/2018 for feeling weak, feverish, coughing up blood.  Tested flu (+) at primary care MD on 07/14/18.  Pt required BiPAP in [facility] ED and was transferred to [facility] for higher level of care.  Pt dx with acute hypoxic respiratory failure due to multilobar PNA, septic shock, acute encephalopathy, metabolic acidosis, acute kidney injury.  Pt was intubated at [facility] ED on 07/13/2018, trach on 2/14 and still on ventilator at time of PT evaluation on 07/26/18.  Pt with other significant PMH of non Hodgkin's lymphoma, transaminitis, L BBB, HTN, diastolic CHF, foot fx surgery.    Clinical Impression   PT admitted with see above complex medical history for admission. Pt currently with functional limitiations due to the deficits listed below (see OT problem list). Pt currently total (A) for all adls.Pt with active movement and aroused to follow commands this session. Pt mouthing answering and return demonstrating.  Pt will benefit from skilled OT to increase their independence and safety with adls and balance to allow discharge LTACH vs SNF. Pt needs post acute rehab for > 2 weeks at this time.     Follow Up Recommendations  LTACH;SNF(long term rehab will be needed at this time)    Equipment Recommendations  3 in 1 bedside commode;Wheelchair (measurements OT);Wheelchair cushion (measurements OT);Hospital bed;Other (comment)(lift/ cushion)    Recommendations for Other Services Speech consult(palliative medicine)     Precautions / Restrictions Precautions Precautions: Fall Restrictions Weight Bearing Restrictions: No      Mobility Bed Mobility               General bed mobility comments: total (A)   Transfers                 General transfer comment: na    Balance                                            ADL either performed or assessed with clinical judgement   ADL Overall ADL's : Needs assistance/impaired                                       General ADL Comments: total (A). pt able to demonstrate clicking and sticking out tongue.      Vision         Perception     Praxis      Pertinent Vitals/Pain Faces Pain Scale: No hurt     Hand Dominance Left   Extremity/Trunk Assessment Upper Extremity Assessment Upper Extremity Assessment: RUE deficits/detail;LUE deficits/detail RUE Deficits / Details: AROM digits and poor following commands to demonstrate shoulder activation  LUE Deficits / Details: dominant: AROM digits and poor following commands to demonstrate shoulder activatio   Lower Extremity Assessment Lower Extremity Assessment: Defer to PT evaluation(history of bil ankle injuries/ surgery on L pinky toe 4wk ag)   Cervical / Trunk Assessment Cervical / Trunk Assessment: Other exceptions Cervical / Trunk Exceptions: weak, and per family she recently had low back injections   Communication Communication Communication: Tracheostomy   Cognition Arousal/Alertness: Awake/alert Behavior During Therapy: Flat affect;Restless Overall Cognitive Status: Impaired/Different from baseline Area  of Impairment: Orientation;Attention;Memory;Following commands;Safety/judgement;Awareness;Problem solving                 Orientation Level: Disoriented to;Place;Time;Situation Current Attention Level: Focused Memory: Decreased recall of precautions;Decreased short-term memory Following Commands: Follows one step commands inconsistently;Follows one step commands with increased time Safety/Judgement: Decreased awareness of safety;Decreased awareness of deficits Awareness: Intellectual Problem Solving: Slow processing;Decreased initiation;Difficulty sequencing;Requires verbal cues;Requires tactile cues General Comments: inconsistently  following commands. answering questions for name dob . pt unaware of location but able to recall after 2 minutes. pt educated on location as hospital and pt seems shocked and lookinga round environment   General Comments  recommending resting hand splints for night time use only and prafo 4 hours on and 4 hours off. [employer] to deliver practices on 07/28/18    Exercises Exercises: Other exercises Other Exercises Other Exercises: PROM of bil UE and educated family on encouragement of activation of bil UE   Shoulder Instructions      Home Living Family/patient expects to be discharged to:: Skilled nursing facility Living Arrangements: Spouse/significant other Available Help at Discharge: Family;Other (Comment)(husband drives a school bus, daughter and son present) Type of Home: House Home Access: Stairs to enter Technical brewer of Steps: 1 Entrance Stairs-Rails: None Home Layout: One level               Home Equipment: Walker - 4 wheels          Prior Functioning/Environment Level of Independence: Independent        Comments: Pt still works as a Production assistant, radio Problem List: Decreased strength;Decreased activity tolerance;Impaired balance (sitting and/or standing);Decreased cognition;Decreased safety awareness;Decreased knowledge of use of DME or AE;Decreased knowledge of precautions;Cardiopulmonary status limiting activity;Impaired UE functional use;Increased edema;Decreased coordination;Decreased range of motion;Impaired sensation      OT Treatment/Interventions: Self-care/ADL training;Therapeutic exercise;Neuromuscular education;Energy conservation;DME and/or AE instruction;Manual therapy;Modalities;Splinting;Therapeutic activities;Cognitive remediation/compensation;Patient/family education;Balance training    OT Goals(Current goals can be found in the care plan section) Acute Rehab OT Goals Patient Stated Goal: family wants her to get better and  return to her baseline.  OT Goal Formulation: With patient/family Time For Goal Achievement: 08/10/18 Potential to Achieve Goals: Good  OT Frequency: Min 3X/week   Barriers to D/C:            Co-evaluation              AM-PAC OT "6 Clicks" Daily Activity     Outcome Measure Help from another person eating meals?: Total Help from another person taking care of personal grooming?: Total Help from another person toileting, which includes using toliet, bedpan, or urinal?: Total Help from another person bathing (including washing, rinsing, drying)?: Total Help from another person to put on and taking off regular upper body clothing?: Total Help from another person to put on and taking off regular lower body clothing?: Total 6 Click Score: 6   End of Session Equipment Utilized During Treatment: Oxygen Nurse Communication: Mobility status;Precautions  Activity Tolerance: Patient tolerated treatment well Patient left: in bed;with call bell/phone within reach  OT Visit Diagnosis: Unsteadiness on feet (R26.81);Muscle weakness (generalized) (M62.81);Cognitive communication deficit (R41.841)                Time: 9242-6834 OT Time Calculation (min): 22 min Charges:  OT General Charges $OT Visit: 1 Visit OT Evaluation $OT Eval High Complexity: 1 High   Jeri Modena, OTR/L  Acute Rehabilitation Services Pager: [phone] Office: [phone] .  Jeri Modena 07/27/2018, 4:46 PM

## 2018-07-27 NOTE — Progress Notes (Signed)
Patient taken off vent and placed on 40% trach collar at this time. Vitals are stable, patient is tolerating well. Will continue to monitor.

## 2018-07-28 ENCOUNTER — Inpatient Hospital Stay (HOSPITAL_COMMUNITY): Payer: Medicare Other

## 2018-07-28 DIAGNOSIS — E876 Hypokalemia: Secondary | ICD-10-CM

## 2018-07-28 LAB — CBC WITH DIFFERENTIAL/PLATELET
Abs Immature Granulocytes: 0.14 10*3/uL — ABNORMAL HIGH (ref 0.00–0.07)
Basophils Absolute: 0 10*3/uL (ref 0.0–0.1)
Basophils Relative: 0 %
EOS PCT: 1 %
Eosinophils Absolute: 0.2 10*3/uL (ref 0.0–0.5)
HCT: 31.5 % — ABNORMAL LOW (ref 36.0–46.0)
Hemoglobin: 9.2 g/dL — ABNORMAL LOW (ref 12.0–15.0)
Immature Granulocytes: 1 %
Lymphocytes Relative: 10 %
Lymphs Abs: 1.1 10*3/uL (ref 0.7–4.0)
MCH: 26.7 pg (ref 26.0–34.0)
MCHC: 29.2 g/dL — ABNORMAL LOW (ref 30.0–36.0)
MCV: 91.3 fL (ref 80.0–100.0)
MONO ABS: 0.7 10*3/uL (ref 0.1–1.0)
Monocytes Relative: 7 %
Neutro Abs: 8.3 10*3/uL — ABNORMAL HIGH (ref 1.7–7.7)
Neutrophils Relative %: 81 %
Platelets: 406 10*3/uL — ABNORMAL HIGH (ref 150–400)
RBC: 3.45 MIL/uL — ABNORMAL LOW (ref 3.87–5.11)
RDW: 20.4 % — ABNORMAL HIGH (ref 11.5–15.5)
WBC: 10.4 10*3/uL (ref 4.0–10.5)
nRBC: 0 % (ref 0.0–0.2)

## 2018-07-28 LAB — COMPREHENSIVE METABOLIC PANEL
ALK PHOS: 326 U/L — AB (ref 38–126)
ALT: 203 U/L — ABNORMAL HIGH (ref 0–44)
AST: 124 U/L — ABNORMAL HIGH (ref 15–41)
Albumin: 2 g/dL — ABNORMAL LOW (ref 3.5–5.0)
Anion gap: 8 (ref 5–15)
BUN: 39 mg/dL — ABNORMAL HIGH (ref 8–23)
CALCIUM: 10.5 mg/dL — AB (ref 8.9–10.3)
CO2: 29 mmol/L (ref 22–32)
Chloride: 114 mmol/L — ABNORMAL HIGH (ref 98–111)
Creatinine, Ser: 0.61 mg/dL (ref 0.44–1.00)
GFR calc Af Amer: >60 mL/min (ref >60)
GFR calc non Af Amer: >60 mL/min (ref >60)
Glucose, Bld: 229 mg/dL — ABNORMAL HIGH (ref 70–99)
Potassium: 3 mmol/L — ABNORMAL LOW (ref 3.5–5.1)
Sodium: 151 mmol/L — ABNORMAL HIGH (ref 135–145)
Total Bilirubin: 0.4 mg/dL (ref 0.3–1.2)
Total Protein: 5.8 g/dL — ABNORMAL LOW (ref 6.5–8.1)

## 2018-07-28 LAB — PHOSPHORUS: Phosphorus: 2.3 mg/dL — ABNORMAL LOW (ref 2.5–4.6)

## 2018-07-28 LAB — GLUCOSE, CAPILLARY
GLUCOSE-CAPILLARY: 183 mg/dL — AB (ref 70–99)
Glucose-Capillary: 198 mg/dL — ABNORMAL HIGH (ref 70–99)
Glucose-Capillary: 184 mg/dL — ABNORMAL HIGH (ref 70–99)
Glucose-Capillary: 214 mg/dL — ABNORMAL HIGH (ref 70–99)
Glucose-Capillary: 148 mg/dL — ABNORMAL HIGH (ref 70–99)
Glucose-Capillary: 120 mg/dL — ABNORMAL HIGH (ref 70–99)

## 2018-07-28 MED ORDER — POTASSIUM CHLORIDE 10 MEQ/50ML IV SOLN
10.0000 meq | INTRAVENOUS | Status: AC
  Administered 2018-07-28 (×6): 10 meq via INTRAVENOUS
  Filled 2018-07-28 (×6): qty 50

## 2018-07-28 MED ORDER — SODIUM PHOSPHATES 45 MMOLE/15ML IV SOLN
10.0000 mmol | Freq: Once | INTRAVENOUS | Status: AC
  Administered 2018-07-28: 10 mmol via INTRAVENOUS
  Filled 2018-07-28: qty 3.33

## 2018-07-28 MED ORDER — ALBUTEROL SULFATE (2.5 MG/3ML) 0.083% IN NEBU: 2.5000 mg | INHALATION_SOLUTION | RESPIRATORY_TRACT | Status: DC | PRN

## 2018-07-28 MED ORDER — FREE WATER
200.0000 mL | Status: DC
  Administered 2018-07-28 – 2018-07-29 (×6): 200 mL

## 2018-07-28 MED ORDER — METOPROLOL TARTRATE 25 MG/10 ML ORAL SUSPENSION
25.0000 mg | Freq: Four times a day (QID) | ORAL | Status: DC
  Administered 2018-07-28 – 2018-07-29 (×2): 25 mg
  Filled 2018-07-28 (×2): qty 10

## 2018-07-28 NOTE — Progress Notes (Signed)
NAME:  Kristin Moon, MRN:  [id number], DOB:  Jun 14, 1945, LOS: 60 ADMISSION DATE:  07/10/2018, CONSULTATION DATE:  07/16/2018 REFERRING MD:  [facility], CHIEF COMPLAINT:  Dyspnea, fever   Brief History   73 y/o female with multiple medical problems admitted after coughig up blood and feeling weak, noted to be flu positive in February.    Past Medical History  Non-hodgkins lymphoma LBBB Diastolic hear failure Hyperlipidemia  Significant Hospital Events   2/7 Admit with fluB infection, pneumococcus PNA, intubated 2/10: CT chest ordered to further evaluate pleural space.  Findings consistent with large left and small to moderate right effusion 2/11: Spiking temperature of 104, cultures resent.  Left chest tube placed.  LFTs significantly elevated, ultrasound ordered.  Ultrasound abdomen negative for acute cholecystitis.   2/12: Still having fevers.  Antibiotics widened, added vancomycin and cefepime.  Discontinued fentanyl drip 2/13: Fever curve is down.  White blood cell count has improved.  Culture still pending.  Continuing nosocomial coverage.  Working on addressing sedation regimen.  Tidal volumes are adequate on spontaneous breathing trial but agitation and deconditioning seem to be a major barrier at this point. 07/23/2018 - TRACH by Dr Nelda Marseille 2/15 -  post trach and bronch for trach - LLL atx has resolved. Has bilateral effusions.  Volume  +9.2L since admit (improved from +11.3L 2d ago). In sinus since yesterday and off cardizem. Seen by cards - CHADSVAC 4 but recommend holding anticoag till there is recurrence. A Fib felt due to medical issues. Family concnered patient not waking up - despite off fent gtt x 4h. On precedex gtt and scheduled klonopin and seroquel. Moves all 4s per RN but mostly uppers and shakes her head 07/25/2018-volume down to +7.4L. Febrile again 101F . Maintained on fent gtt (currently holiday) with precedex gtt on holiday ->tachypneic and need sedation but able  to track family and squeeze. Diffusely weak. CT head negative 2/17 ongoing fever, neg I/O balance   Procedures:  Endotracheal intubation- 07/16/2018 >> CVC insertion - 07/16/2018 >> removed  Thoracentesis 2/7 > LDH appriox 1200 Bronch, BAL 2/7 Left chest tube 2/11>> R PICC 2/13 >> Trach 2/14 >>  Significant Diagnostic Tests:  Chest x-ray 2/9- slight improvement in dense left lung consolidation.  I have reviewed the images personally. CT chest 2/10: Bibasilar left greater than right pneumonia/consolidation with moderate to large left effusion and small to moderate right effusion. Right upper quadrant ultrasound 2/11>>> some sludge, no significant distention.  No pericholecystic fluid CTH 2/15 >> neg  Micro Data: ( pending)  MRSA PCR 2/7 >> neg Urine strep ag 2/7>> positive  BC 2/6 >> neg RVP 2/7 --> Flu B BAL 2/7 >> Left pleural fluid cx 2/7 >> Influenza swab--> Flu B Blood 2/11>>ng Urine 2/11>>>YEAST >100K Left pleural fluid 2/11>>>ng ............... Trach asp 2/11 >>normal flora  Trach asp 2/16 >>  BC x 2 2/16 >>   Antimicrobials:  Vancomycin: 2/6 > 2/7; 2/12 >> Cefepime: 2/6 >> 2/10; 2/12 >> Ceftriaxone 2/10 >>> 2/12 Tamiflu 2/5 >> completed 5 days Flagyl 2/16 >>   Interim history/subjective:  No acute events Chest tube clamped overnight Pneumothorax resolved  Objective   Blood pressure (!) 131/54, pulse (!) 101, temperature 100.3 F (37.9 C), temperature source Rectal, resp. rate (!) 25, height _0  (1.575 m), weight 78.5 kg, SpO2 100 %.    Vent Mode: PRVC FiO2 (%):  [35 %-50 %] 35 % Set Rate:  [22 bmp] 22 bmp Vt Set:  [350 mL]  350 mL PEEP:  [5 cmH20] 5 cmH20 Plateau Pressure:  [12 cmH20-19 cmH20] 16 cmH20   Intake/Output Summary (Last 24 hours) at 07/28/2018 1330 Last data filed at 07/28/2018 1300 Gross per 24 hour  Intake 1908.7 ml  Output 260 ml  Net 1648.7 ml   Filed Weights   07/26/18 0500 07/27/18 0500 07/28/18 0500  Weight: 74.3 kg 75.9  kg 78.5 kg    Examination:  General:  In bed on vent HENT: NCAT tracheostomy in place PULM: CTA B, vent supported breathing CV: RRR, no mgr GI: BS+, soft, nontender MSK: normal bulk and tone Neuro: awake on vent, no distress    Resolved Hospital Problem list   transaminitis Sludge in gallbladder   Assessment & Plan:  Acute on chronic respiratory failure with hypoxemia > tracheostomy care during day, vent at night > trach care per routine  Large left pleural effusion: slight re-accumulation on this morning's X-ray? Pneumothorax left: resolved > unclamp tube, monitor fluid output for another 24 hours > consider removing tube tomorrow  Atrial fibrillation with RVR > tele > metoprolol  Influenza and pneumococcal pneumonia with unexplained ongoing fevers > monitor off of antibiotics > hold further cultures  Physical deconditioning > PT consult  Hypothyroidism > synthroid  Anemia of critical illness > monitor for bleeding  Hypernatremia: worsening > advance free water via tube  Best practice:  Diet: NPO Pain/Anxiety/Delirium protocol (if indicated): no VAP protocol (if indicated): yes DVT prophylaxis: sub q hep GI prophylaxis: famotidine Glucose control: SSI Mobility: PT consult Code Status: full Family Communication: updated family bedside Disposition:   Labs   CBC: Recent Labs  Lab 07/22/18 0440  07/24/18 0422 07/25/18 1540 07/26/18 0420 07/27/18 0500 07/28/18 0319  WBC 8.4   < > 10.6* 9.8 13.3* 11.7* 10.4  NEUTROABS 7.3  --   --  8.6* 10.9* 9.2* 8.3*  HGB 9.5*   < > 9.1* 8.6* 9.7* 9.5* 9.2*  HCT 31.2*   < > 30.8* 29.9* 32.1* 32.5* 31.5*  MCV 86.0   < > 85.8 87.2 87.0 89.3 91.3  PLT 163   < > 262 284 356 445* 406*   < > = values in this interval not displayed.    Basic Metabolic Panel: Recent Labs  Lab 07/22/18 0440  07/23/18 0253 07/24/18 0422 07/25/18 0539 07/26/18 0016 07/26/18 0420 07/27/18 0500 07/28/18 0319  NA 142   < >  142 141  --  145 145 148* 151*  K 4.0   < > 3.5 3.5  --  2.9* 3.3* 3.4* 3.0*  CL 112*  --  105 103  --  102 104 110 114*  CO2 22  --  28 30  --  33* 34* 30 29  GLUCOSE 258*  --  183* 163*  --  202* 192* 236* 229*  BUN 28*  --  30* 29*  --  36* 35* 39* 39*  CREATININE 0.61  --  0.69 0.70  --  0.79 0.67 0.83 0.61  CALCIUM 8.6*  --  9.4 9.0  --  9.5 9.6 10.1 10.5*  MG  --   --  1.7 2.1 2.3  --  2.1 2.6*  --   PHOS 2.9  --  3.5  --   --   --  2.2* 2.4* 2.3*   < > = values in this interval not displayed.   GFR: Estimated Creatinine Clearance: 63.5 mL/min (by C-G formula based on SCr of 0.61 mg/dL). Recent Labs  Lab 07/25/18  1228 07/25/18 1540 07/26/18 0016 07/26/18 0420 07/27/18 0500 07/28/18 0319  PROCALCITON 1.06  --   --  0.99 0.79  --   WBC  --  9.8  --  13.3* 11.7* 10.4  LATICACIDVEN 1.3  --  1.1  --   --   --     Liver Function Tests: Recent Labs  Lab 07/22/18 0440 07/23/18 0253 07/24/18 0422 07/25/18 0539 07/28/18 0319  AST 379* 139* 83* 52* 124*  ALT 1,024* 649* 430* 264* 203*  ALKPHOS 124 166* 204* 249* 326*  BILITOT 0.7 0.9 0.6 0.8 0.4  PROT 5.1* 5.2* 5.1* 5.9* 5.8*  ALBUMIN 1.6* 1.6* 1.7* 1.9* 2.0*   No results for input(s): LIPASE, AMYLASE in the last 168 hours. No results for input(s): AMMONIA in the last 168 hours.  ABG    Component Value Date/Time   PHART 7.453 (H) 07/22/2018 1847   PCO2ART 39.3 07/22/2018 1847   PO2ART 130.0 (H) 07/22/2018 1847   HCO3 27.5 07/22/2018 1847   TCO2 29 07/22/2018 1847   ACIDBASEDEF 1.0 07/22/2018 0353   O2SAT 99.0 07/22/2018 1847     Coagulation Profile: No results for input(s): INR, PROTIME in the last 168 hours.  Cardiac Enzymes: Recent Labs  Lab 07/22/18 1515 07/22/18 2203 07/23/18 0253 07/23/18 0830  TROPONINI 0.05* 0.06* 0.10* 0.10*    HbA1C: No results found for: HGBA1C  CBG: Recent Labs  Lab 07/27/18 1945 07/27/18 2344 07/28/18 0339 07/28/18 0809 07/28/18 1145  GLUCAP 225* 198* 198* 184*  214*     Critical care time: 35 minutes    Roselie Awkward, MD [place] [facility] Pager: [phone] Cell: [phone] If no response, call [phone]

## 2018-07-28 NOTE — Progress Notes (Addendum)
Nutrition Follow-up  DOCUMENTATION CODES:   Not applicable  INTERVENTION:   Tube Feeding:  Continue Vital AF 1.2 at 65 ml/hr (1560 ml) via Cortrak Provides 117 g of protein, 1872 kcals, 1264 mL of free water Meets 100% protein needs, 100% calorie needs  NUTRITION DIAGNOSIS:   Inadequate oral intake related to inability to eat as evidenced by NPO status.  Ongoing  GOAL:   Patient will meet greater than or equal to 90% of their needs  Met with TF  MONITOR:   Vent status, Labs, Weight trends, TF tolerance, Skin, I & O's  REASON FOR ASSESSMENT:   Ventilator, Consult Enteral/tube feeding initiation and management  ASSESSMENT:   73 yr old female w/ PMHx of NHL, HLD, HTN, Hypothyroidism, LBBB, CAD G1DD presented from home via EMS after coughing up blood and feeling weak, Tested flu positive at her PMD visit on 07/14/2018.  2/07 Intubated, Thoracentesis, Bronch 2/11 Left chest tube placed, Febrile 2/14- gastric Cortrak placed, trach   Pt discussed with RN. Fever ongoing. Off vent during the day, continues to require it at night. Awaiting PMV evaluation from SLP. Plan for chest tube removal this am. Tolerating TF well.   Weight decreased from 81.4 kg on 2/12 to 78.5 kg today. Will use EDW of 74 kg to estimate needs.   I/O: +9326 ml since admit UOP: 750 ml x 24 hrs  Chest tube: 10 ml x 24 hrs   Medications reviewed and include: SS novolog, Levemir, sodium phosphate in D5 Labs reviewed: Na 151 (H) K 3.0 (L) Phosphorus 2.3 (L) corrected calcium 12.1 (H) CBG 160-225  Diet Order:   Diet Order            Diet NPO time specified  Diet effective midnight              EDUCATION NEEDS:   Education needs have been addressed  Skin:  Skin Assessment: Reviewed RN Assessment  Last BM:  2/18  Height:   Ht Readings from Last 1 Encounters:  07/21/18 5' 2" (1.575 m)    Weight:   Wt Readings from Last 1 Encounters:  07/28/18 78.5 kg    Ideal Body Weight:  56.8  kg  BMI:  Body mass index is 31.65 kg/m.  Estimated Nutritional Needs:   Kcal:  1750-1950 kcal  Protein:  110-145 grams  Fluid:  >/= 1.7 L/day   Mariana Single RD, LDN Clinical Nutrition Pager # - [phone]

## 2018-07-28 NOTE — Progress Notes (Signed)
Occupational Therapy Treatment Patient Details Name: Kristin Moon MRN: [id number] DOB: December 18, 1945 Today's Date: 07/28/2018    History of present illness 73 y.o. female admitted on 07/27/2018 for feeling weak, feverish, coughing up blood.  Tested flu (+) at primary care MD on 07/14/18.  Pt required BiPAP in [facility] ED and was transferred to [facility] for higher level of care.  Pt dx with acute hypoxic respiratory failure due to multilobar PNA, septic shock, acute encephalopathy, metabolic acidosis, acute kidney injury.  Pt was intubated at [facility] ED on 07/10/2018, trach on 2/14 and still on ventilator at time of PT evaluation on 07/26/18.  Pt with other significant PMH of non Hodgkin's lymphoma, transaminitis, L BBB, HTN, diastolic CHF, foot fx surgery.    OT comments  Pt currently with resting hand splints in the room delivered by [employer] this morning. All education complete at this time and Ot to continue to follow acutely.   Follow Up Recommendations  LTACH;SNF    Equipment Recommendations  3 in 1 bedside commode;Wheelchair (measurements OT);Wheelchair cushion (measurements OT);Hospital bed;Other (comment)    Recommendations for Other Services Speech consult    Precautions / Restrictions Precautions Precautions: Fall       Mobility Bed Mobility- total +2 total observed with RN rolling for bath at end of session  Transfers           ADL either performed or assessed with clinical judgement     Cognition Arousal/Alertness: Awake/alert Behavior During Therapy: Flat affect;Restless Overall Cognitive Status: Impaired/Different from baseline          Problem Solving: Slow processing General Comments: decr following commands inconsistently        Exercises Other Exercises Other Exercises: Splint check- pt with splints don on arrival Other Exercises: educated RN and tech regarding night time only wear schedule Other Exercises: schedule posted in the room with photo  reference Other Exercises: drawer labeled and supplies placed in the drawer to help organzie patient items        Frequency  Min 3X/week        Progress Toward Goals  OT Goals(current goals can now be found in the care plan section)  Progress towards OT goals: Progressing toward goals  Acute Rehab OT Goals Patient Stated Goal: family wants her to get better and return to her baseline.  OT Goal Formulation: With patient/family Time For Goal Achievement: 08/10/18 Potential to Achieve Goals: Good ADL Goals Pt Will Perform Grooming: with mod assist;sitting Pt Will Transfer to Toilet: with +2 assist;with mod assist;bedside commode Additional ADL Goal #1: pt will complete bed mobility total +2 mod (A) as precursor to adls Additional ADL Goal #2: Pt will tolerate eob min (A) as precursor to adls.  Plan Discharge plan remains appropriate    Co-evaluation                 AM-PAC OT "6 Clicks" Daily Activity     Outcome Measure   Help from another person eating meals?: Total Help from another person taking care of personal grooming?: Total Help from another person toileting, which includes using toliet, bedpan, or urinal?: Total Help from another person bathing (including washing, rinsing, drying)?: Total Help from another person to put on and taking off regular upper body clothing?: Total Help from another person to put on and taking off regular lower body clothing?: Total 6 Click Score: 6    End of Session    OT Visit Diagnosis: Unsteadiness on feet (R26.81);Muscle  weakness (generalized) (M62.81);Cognitive communication deficit (R41.841)   Activity Tolerance Patient tolerated treatment well   Patient Left in bed;with call bell/phone within reach;with nursing/sitter in room(tech starting bath with patient)   Nurse Communication Mobility status;Precautions        Time: 5521-7471 OT Time Calculation (min): 22 min  Charges: OT General Charges $OT Visit: 1  Visit OT Treatments $Orthotics/Prosthetics Check: 8-22 mins   Jeri Modena, OTR/L  Acute Rehabilitation Services Pager: [phone] Office: [phone] .    Jeri Modena 07/28/2018, 9:20 AM

## 2018-07-28 NOTE — Progress Notes (Signed)
Verbal order to unclamp Chest tube and hook up to suction by Lake Bells MD.  Donnetta Simpers, RN

## 2018-07-28 NOTE — Progress Notes (Addendum)
Progress Note  Patient Name: Kristin Moon Date of Encounter: 07/28/2018  Primary Cardiologist: [name], MD   Subjective   Awake and alert. Continues w/ trach. No visible distress.   Inpatient Medications    Scheduled Meds: . chlorhexidine gluconate (MEDLINE KIT)  15 mL Mouth Rinse BID  . Chlorhexidine Gluconate Cloth  6 each Topical Daily  . famotidine  20 mg Per Tube BID  . heparin  5,000 Units Subcutaneous Q8H  . insulin aspart  0-20 Units Subcutaneous Q4H  . insulin aspart  4 Units Subcutaneous Q4H  . insulin detemir  5 Units Subcutaneous BID  . levothyroxine  50 mcg Oral Q0600  . mouth rinse  15 mL Mouth Rinse 10 times per day  . metoprolol tartrate  25 mg Per Tube TID  . sodium chloride flush  10-40 mL Intracatheter Q12H   Continuous Infusions: . sodium chloride Stopped (07/25/18 1249)  . sodium chloride Stopped (07/28/18 0624)  . feeding supplement (VITAL AF 1.2 CAL) 1,000 mL (07/28/18 0557)  . potassium chloride 10 mEq (07/28/18 1219)   PRN Meds: sodium chloride, sodium chloride, acetaminophen (TYLENOL) oral liquid 160 mg/5 mL, albuterol, fentaNYL (SUBLIMAZE) injection, ibuprofen, polyethylene glycol, sennosides, sodium chloride flush   Vital Signs    Vitals:   07/28/18 1000 07/28/18 1100 07/28/18 1131 07/28/18 1200  BP: (!) 140/58 135/61 135/61 (!) 150/104  Pulse: (!) 108 99 (!) 102 100  Resp: (!) 29 (!) 27 (!) 23 17  Temp:      TempSrc:      SpO2: 100% 100% 99% 97%  Weight:      Height:        Intake/Output Summary (Last 24 hours) at 07/28/2018 1225 Last data filed at 07/28/2018 1219 Gross per 24 hour  Intake 2072.12 ml  Output 760 ml  Net 1312.12 ml   Last 3 Weights 07/28/2018 07/27/2018 07/26/2018  Weight (lbs) 173 lb 1 oz 167 lb 5.3 oz 163 lb 12.8 oz  Weight (kg) 78.5 kg 75.9 kg 74.3 kg      Telemetry    NSR/ sinus tach 90s-low 100s - Personally Reviewed  ECG    Not performed today Personally Reviewed  Physical Exam   GEN:  elderly WF, trach collar, No acute distress.   Neck: No JVD Cardiac: RRR, no murmurs, rubs, or gallops.  Respiratory: Clear to auscultation bilaterally. GI: Soft, nontender, non-distended  MS: No edema; No deformity. Neuro:  Nonfocal  Psych: Normal affect   Labs    Chemistry Recent Labs  Lab 07/24/18 0422 07/25/18 0539  07/26/18 0420 07/27/18 0500 07/28/18 0319  NA 141  --    < > 145 148* 151*  K 3.5  --    < > 3.3* 3.4* 3.0*  CL 103  --    < > 104 110 114*  CO2 30  --    < > 34* 30 29  GLUCOSE 163*  --    < > 192* 236* 229*  BUN 29*  --    < > 35* 39* 39*  CREATININE 0.70  --    < > 0.67 0.83 0.61  CALCIUM 9.0  --    < > 9.6 10.1 10.5*  PROT 5.1* 5.9*  --   --   --  5.8*  ALBUMIN 1.7* 1.9*  --   --   --  2.0*  AST 83* 52*  --   --   --  124*  ALT 430* 264*  --   --   --  203*  ALKPHOS 204* 249*  --   --   --  326*  BILITOT 0.6 0.8  --   --   --  0.4  GFRNONAA >60  --    < > >60 >60 >60  GFRAA >60  --    < > >60 >60 >60  ANIONGAP 8  --    < > 7 8 8    < > = values in this interval not displayed.     Hematology Recent Labs  Lab 07/26/18 0420 07/27/18 0500 07/28/18 0319  WBC 13.3* 11.7* 10.4  RBC 3.69* 3.64* 3.45*  HGB 9.7* 9.5* 9.2*  HCT 32.1* 32.5* 31.5*  MCV 87.0 89.3 91.3  MCH 26.3 26.1 26.7  MCHC 30.2 29.2* 29.2*  RDW 20.4* 20.9* 20.4*  PLT 356 445* 406*    Cardiac Enzymes Recent Labs  Lab 07/22/18 1515 07/22/18 2203 07/23/18 0253 07/23/18 0830  TROPONINI 0.05* 0.06* 0.10* 0.10*   No results for input(s): TROPIPOC in the last 168 hours.   BNPNo results for input(s): BNP, PROBNP in the last 168 hours.   DDimer No results for input(s): DDIMER in the last 168 hours.   Radiology    Dg Chest Port 1 View  Result Date: 07/28/2018 CLINICAL DATA:  Pneumothorax follow-up EXAM: PORTABLE CHEST 1 VIEW COMPARISON:  Yesterday FINDINGS: Tracheostomy tube that remains well seated. Right upper extremity PICC and left chest tube in stable unremarkable  position. The feeding tube at least reaches the stomach. Stable trace left apical pneumothorax. Stable low volumes and interstitial crowding. Stable heart size. IMPRESSION: Stable hardware positioning and trace left apical pneumothorax. Electronically Signed   By: Monte Fantasia M.D.   On: 07/28/2018 10:13   Dg Chest Port 1 View  Result Date: 07/27/2018 CLINICAL DATA:  Tiny apical pneumothorax EXAM: PORTABLE CHEST 1 VIEW COMPARISON:  07/27/2018 FINDINGS: Smaller left apical pneumothorax now projecting up to the posterior second rib level, previously at the third rib level. Reposition left-sided chest tube is noted with tip overlying the posterior fifth rib. No mediastinal shift. Tracheostomy tube is in place. A weighted feeding tube projects below the left hemidiaphragm. The tip is excluded on this study. Chronic mild interstitial prominence is noted slightly decreased in appearance. Right-sided PICC line tip terminates at the cavoatrial juncture. Tracheostomy tube tip projects over the trachea at the level of the aortic arch. IMPRESSION: Smaller left apical pneumothorax. Left-sided chest tube in place. Satisfactory support line and tube positions to the extent visualized. Electronically Signed   By: Ashley Royalty M.D.   On: 07/27/2018 19:23   Dg Chest Port 1 View  Result Date: 07/27/2018 CLINICAL DATA:  Respiratory failure. EXAM: PORTABLE CHEST 1 VIEW COMPARISON:  07/26/2018.  07/24/2017. FINDINGS: Tracheostomy tube, feeding tube, right PICC line in stable position. Left chest tube in stable position. Heart size stable. Bibasilar atelectasis/infiltrates again noted. No pleural effusion. Tiny left apical pneumothorax noted on today's exam. IMPRESSION: 1. Left chest tube in stable position. Tiny left apical pneumothorax noted on today's exam. 2.  Remaining lines and tubes stable position. 3.  Bibasilar atelectasis/infiltrates again noted. Critical Value/emergent results were called by telephone at the time of  interpretation on 07/27/2018 at 7:32 am to nurse Mel Almond, who verbally acknowledged these results. Electronically Signed   By: Marcello Moores  Register   On: 07/27/2018 07:33    Cardiac Studies   2D Echo 07/23/18 IMPRESSIONS    1. The left ventricle has low normal systolic function, with an ejection fraction of  50-55%. The cavity size was normal. Left ventricular diastolic Doppler parameters are consistent with pseudonormalization Elevated left ventricular end-diastolic  pressure The E/e' is 26.3.  2. The right ventricle has normal systolic function. The cavity was normal. There is no increase in right ventricular wall thickness.  3. The mitral valve is normal in structure. There is mild mitral annular calcification present.  4. The tricuspid valve is normal in structure.  5. The aortic valve is tricuspid Mild sclerosis of the aortic valve. moderate stenosis of the aortic valve.  6. The pulmonic valve was normal in structure.  7. Right atrial pressure is estimated at 3 mmHg.  Patient Profile     73 y.o. female with  Hx of diastlic CHF, HTN, HLD, LBBB, minimal CAD on cath 2012, recent diagnosis of flu (tested + at PCP office 2/5), admitted for acute hypoxic respiratory failure requiring BiPAP>>>intubation>>trach. Also septic shock with multilobar PNA with development of bilateral pleural effusions s/p chest tube placement. Cardiology consulted on 2/14 for atrial fibrillation.  EF normal by echo.    Assessment & Plan    1. Atrial Fibrillation: No recurrence   Currently in SR. HR in the 90s-low 100s. BP elevated. May need further titration of metoprolol. Will defer to Dr. Harrington Challenger.   2. Acute on Chronic Diastolic CHF: volume looks ok. She remains tachycardic.Marland Kitchen Lasix remains on hold.  3. Hypokalemia: 3.0 today. Mg has been stable over the last several days, >2.0. Last dose of lasix was yesterday. Continue to hold. Supplemental K ordered. May later consider addition of spironolactone, if persistent  hypokalemia and HTN.   4. HTN: BP elevated today. Consider increasing metoprolol. HR in the 90s-low 100s. May also consider later addition of spironolactone. See above.    For questions or updates, please contact [place] Please consult [email or web address] for contact info under        Signed, Lyda Jester, PA-C  07/28/2018, 12:25 PM    Pt seen and examined   I have amended note above by B Simmons  On exam, pt sitting in bed    Lungs with coarse rhonchi Cardiac RRR   No S3 Abd is supple  Ext   Feet warm   No edema  No recurrence of atrial fib I would recomm increasing metoprolol to 25 q6 hours  Dorris Carnes MD  Will continue to follow

## 2018-07-28 NOTE — Progress Notes (Signed)
eLink Physician-Brief Progress Note Patient Name: EVERETTE DIMAURO DOB: March 18, 1946 MRN: [id number]   Date of Service  07/28/2018  HPI/Events of Note  K+ = 3.0, PO4--- = 2.3 and Creatinine = 0.61.   eICU Interventions  Will replace K+ and PO4---.     Intervention Category Major Interventions: Electrolyte abnormality - evaluation and management  Celese Banner Eugene 07/28/2018, 6:06 AM

## 2018-07-28 NOTE — Evaluation (Signed)
[facility]y-[facility] Speaking Valve - Evaluation Patient Details  Name: LIBA HULSEY MRN: [id number] Date of Birth: 1945-11-28  Today's Date: 07/28/2018 Time: 0210-0230 SLP Time Calculation (min) (ACUTE ONLY): 20 min  Past Medical History:  Past Medical History:  Diagnosis Date  . Chronic diastolic CHF (congestive heart failure) ([place])   . Diastolic heart failure    Grade 1 per echo May 2012  . Hyperlipidemia   . Hypertension   . Hypothyroidism   . Left bundle branch block   . Non Hodgkin's lymphoma ([place])    history  . Non Hodgkin's lymphoma ([place])   . S/P right and left heart catheterization May 2012   minimal CAD. EF of 50%  . Transaminitis    Past Surgical History:  Past Surgical History:  Procedure Laterality Date  . ABDOMINAL HYSTERECTOMY  1983  . CARDIAC CATHETERIZATION  10/17/2010    Left ventriculography shows mild global left ventricular hypokinesisThe LVEF is estimated at 50%.  Marland Kitchen FOOT FRACTURE SURGERY     HPI:  73 yr old female w/ PMHx of NHL, HLD, HTN, Hypothyroidism, LBBB, CAD G1DD presented from home via EMS after coughing up blood and feeling weak, Tested flu positive at her PMD visit on 07/14/2018 and started on Tamiflu as outpatient but continued to worsen. Failed BiPAP treatment in ED, intubated for acute respiratory failure on 2/7-2/14 trach placed.    Assessment / Plan / Recommendation Clinical Impression  Pt demonstrates inadequate redirection of air to upper airway with PMSV in place; no audible phonation with speech, cough, throat clear despite effort from pt. Given otherwise good mentation and sensation of minimal airflow with pursed lip blowing, assume only a minimal amount of air is passing to upper airway, possibly due to presence of 6 cuffed shiley in a petite woman. Able to tolerate placement up to 45 second before resistance reported though no back pressure noted upon removal during inhalation. Pt recommended to use PMSV with SLP only though progress may only be  anticipated when pt becomes ready for PMSV downsize. Will discuss with MD when pt more consistently off vent.  SLP Visit Diagnosis: Dysphagia, unspecified (R13.10)    SLP Assessment       Follow Up Recommendations  Inpatient Rehab    Frequency and Duration min 2x/week  2 weeks    PMSV Trial PMSV was placed for: 45 second Able to redirect subglottic air through upper airway: Yes(minimal) Able to Attain Phonation: No Voice Quality: Aphonic Able to Expectorate Secretions: No Breath Support for Phonation: (good effort) Intelligibility: Intelligible(mouthing phrases) Respirations During Trial: 28 SpO2 During Trial: (100) Behavior: Alert;Cooperative   Tracheostomy Tube  Additional Tracheostomy Tube Assessment Trach Collar Period: all waking hours    Vent Dependency  Vent Dependent: No FiO2 (%): 35 %(decreased per Spo2 100%)    Cuff Deflation Trial  GO Tolerated Cuff Deflation: Yes(baseline )        Kyvon Hu, Katherene Ponto 07/28/2018, 3:01 PM

## 2018-07-28 NOTE — Progress Notes (Signed)
OT NOTE  HAND SPLINTS   SPLINTS TO BE ONLY WORN AT NIGHT . Please check splint every during shift (remove splint) to assess for: . pain . redness . *swelling  . If any symptoms above present remove splint for 15 minutes. If symptoms continue - keep the splint removed and notify OT staff [phone] immediately.   Marland Kitchen Keep the UE elevated at all times on pillows / towels.  Marland Kitchen Splint can be cleaned with warm soapy water and alcohol swab. Splint should not be placed in heat of any kind because the splint with mold into a new shape.   . Second set of splint covers are present in the room to keep splints clean

## 2018-07-28 NOTE — Progress Notes (Signed)
Spoke to [place], NP. Verbal order to clamp chest tube and repeat chest xray in 6 hours at 0800.  Chest tube clamped; Will continue to monitor.

## 2018-07-28 NOTE — Evaluation (Cosign Needed Addendum)
Clinical/Bedside Swallow Evaluation Patient Details  Name: Kristin Moon MRN: [id number] Date of Birth: 07-11-1945  Today's Date: 07/28/2018 Time: SLP Start Time (ACUTE ONLY): 61 SLP Stop Time (ACUTE ONLY): 0230 SLP Time Calculation (min) (ACUTE ONLY): 20 min  Past Medical History:  Past Medical History:  Diagnosis Date  . Chronic diastolic CHF (congestive heart failure) ([place])   . Diastolic heart failure    Grade 1 per echo May 2012  . Hyperlipidemia   . Hypertension   . Hypothyroidism   . Left bundle branch block   . Non Hodgkin's lymphoma ([place])    history  . Non Hodgkin's lymphoma ([place])   . S/P right and left heart catheterization May 2012   minimal CAD. EF of 50%  . Transaminitis    Past Surgical History:  Past Surgical History:  Procedure Laterality Date  . ABDOMINAL HYSTERECTOMY  1983  . CARDIAC CATHETERIZATION  10/17/2010    Left ventriculography shows mild global left ventricular hypokinesisThe LVEF is estimated at 50%.  Marland Kitchen FOOT FRACTURE SURGERY     HPI:  73 yr old female w/ PMHx of NHL, HLD, HTN, Hypothyroidism, LBBB, CAD G1DD presented from home via EMS after coughing up blood and feeling weak, Tested flu positive at her PMD visit on 07/14/2018 and started on Tamiflu as outpatient but continued to worsen. Failed BiPAP treatment in ED, intubated for acute respiratory failure on 2/7-2/14 trach placed.    Assessment / Plan / Recommendation Clinical Impression  Pt with hx of prolonged intubation denies hx of dysphagia and eats a variety of foods at baseline. Pt is trached and was unable to achieve phonation during PMSV trial, but able to generate a weak cough. During trials, pt demonstrated multiple swallows with consumption of one ice chip or sip of thin liquid, concerns for build up of secretions. Otherwise, no overt s/sx of aspiration noted and pt denied discomfort with intake and vitals remained stable. Given prolonged intubation, aphonia and decreased cough recommend NPO  and SLP to f/u with FEES to determine further goals and diet advancement.   SLP Visit Diagnosis: Dysphagia, unspecified (R13.10)    Aspiration Risk  Severe aspiration risk    Diet Recommendation NPO        Other  Recommendations Oral Care Recommendations: Oral care QID   Follow up Recommendations Inpatient Rehab      Frequency and Duration min 2x/week          Prognosis Prognosis for Safe Diet Advancement: Good      Swallow Study   General HPI: 73 yr old female w/ PMHx of NHL, HLD, HTN, Hypothyroidism, LBBB, CAD G1DD presented from home via EMS after coughing up blood and feeling weak, Tested flu positive at her PMD visit on 07/14/2018 and started on Tamiflu as outpatient but continued to worsen. Failed BiPAP treatment in ED, intubated for acute respiratory failure on 2/7-2/14 trach placed.  Type of Study: Bedside Swallow Evaluation Diet Prior to this Study: NPO Temperature Spikes Noted: Yes Respiratory Status: Trach Collar History of Recent Intubation: Yes Length of Intubations (days): 8 days Date extubated: 07/23/18 Behavior/Cognition: Alert;Cooperative Oral Cavity Assessment: Dry;Within Functional Limits Oral Care Completed by SLP: No Oral Cavity - Dentition: Adequate natural dentition Self-Feeding Abilities: Total assist Patient Positioning: Upright in bed Baseline Vocal Quality: Aphonic Volitional Cough: Weak    Oral/Motor/Sensory Function Overall Oral Motor/Sensory Function: Within functional limits   Ice Chips Ice chips: Impaired Presentation: Spoon Pharyngeal Phase Impairments: Multiple swallows   Thin Liquid Thin  Liquid: Impaired Presentation: Cup Pharyngeal  Phase Impairments: Multiple swallows    Nectar Thick Nectar Thick Liquid: Not tested   Honey Thick Honey Thick Liquid: Not tested   Puree Puree: Not tested   Solid     Solid: Not tested      Ellis Savage, Student SLP 07/28/2018,2:52 PM

## 2018-07-28 NOTE — Progress Notes (Signed)
Physical Therapy Treatment Patient Details Name: Kristin Moon MRN: [id number] DOB: [birth year] Today's Date: 07/28/2018    History of Present Illness 73 y.o. female admitted on 07/28/2018 for feeling weak, feverish, coughing up blood.  Tested flu (+) at primary care MD on 07/14/18.  Pt required BiPAP in [facility] ED and was transferred to [facility] for higher level of care.  Pt dx with acute hypoxic respiratory failure due to multilobar PNA, septic shock, acute encephalopathy, metabolic acidosis, acute kidney injury.  Pt was intubated at [facility] ED on 07/16/2018, trach on 2/14 and still on ventilator at time of PT evaluation on 07/26/18.  Pt with other significant PMH of non Hodgkin's lymphoma, transaminitis, L BBB, HTN, diastolic CHF, foot fx surgery.     PT Comments    Session focused on bed mobility, therex, and sitting balance. Pt max A to sit with mod A for stability. Following cues and instructions for therex well, will benefit from reinforcement next session for ensured cary over. VSS on 35% TC. Pt fatigues quickly and and struggles to move limbs against gravity at this time.      Follow Up Recommendations  CIR     Equipment Recommendations  3in1 (PT);Wheelchair (measurements PT);Wheelchair cushion (measurements PT)    Recommendations for Other Services Rehab consult     Precautions / Restrictions Precautions Precautions: Fall Restrictions Weight Bearing Restrictions: No    Mobility  Bed Mobility Overal bed mobility: Needs Assistance Bed Mobility: Supine to Sit     Supine to sit: Max assist;HOB elevated     General bed mobility comments: max A to sit EOB unable to balacne herself today. min A for stabiltiy  Transfers                 General transfer comment: deferred due to weakness and safety  Ambulation/Gait                 Stairs             Wheelchair Mobility    Modified Rankin (Stroke Patients Only)       Balance Overall balance  assessment: Needs assistance Sitting-balance support: Feet supported;Bilateral upper extremity supported;Single extremity supported Sitting balance-Leahy Scale: Poor Sitting balance - Comments: mod A to stabilize in sitting                                    Cognition Arousal/Alertness: Awake/alert Behavior During Therapy: Flat affect;Restless Overall Cognitive Status: Impaired/Different from baseline Area of Impairment: Orientation;Attention;Memory;Following commands;Safety/judgement;Awareness;Problem solving                 Orientation Level: Disoriented to;Place;Time;Situation Current Attention Level: Focused Memory: Decreased recall of precautions;Decreased short-term memory Following Commands: Follows one step commands inconsistently;Follows one step commands with increased time Safety/Judgement: Decreased awareness of safety;Decreased awareness of deficits Awareness: Intellectual Problem Solving: Slow processing General Comments: decr following commands inconsistently      Exercises General Exercises - Lower Extremity Ankle Circles/Pumps: 20 reps Quad Sets: 10 reps Long Arc Quad: 10 reps Heel Slides: 10 reps Hip ABduction/ADduction: 10 reps    General Comments        Pertinent Vitals/Pain Pain Assessment: No/denies pain Faces Pain Scale: No hurt    Home Living                      Prior Function  PT Goals (current goals can now be found in the care plan section) Acute Rehab PT Goals Patient Stated Goal: family wants her to get better and return to her baseline.  PT Goal Formulation: With family Time For Goal Achievement: 08/09/18 Potential to Achieve Goals: Good Progress towards PT goals: Progressing toward goals    Frequency    Min 2X/week      PT Plan Current plan remains appropriate    Co-evaluation              AM-PAC PT "6 Clicks" Mobility   Outcome Measure  Help needed turning from your back  to your side while in a flat bed without using bedrails?: Total Help needed moving from lying on your back to sitting on the side of a flat bed without using bedrails?: Total Help needed moving to and from a bed to a chair (including a wheelchair)?: Total Help needed standing up from a chair using your arms (e.g., wheelchair or bedside chair)?: Total Help needed to walk in hospital room?: Total Help needed climbing 3-5 steps with a railing? : Total 6 Click Score: 6    End of Session Equipment Utilized During Treatment: Oxygen;Other (comment) Activity Tolerance: Patient limited by fatigue Patient left: in bed;with call bell/phone within reach;with bed alarm set;with nursing/sitter in room;with family/visitor present;Other (comment) Nurse Communication: Mobility status PT Visit Diagnosis: Muscle weakness (generalized) (M62.81);Difficulty in walking, not elsewhere classified (R26.2)     Time: 1510-1540 PT Time Calculation (min) (ACUTE ONLY): 30 min  Charges:  $Therapeutic Exercise: 8-22 mins $Therapeutic Activity: 8-22 mins                     Reinaldo Berber, PT, DPT Acute Rehabilitation Services Pager: [phone] Office: [phone]     Reinaldo Berber 07/28/2018, 5:13 PM

## 2018-07-29 LAB — GLUCOSE, CAPILLARY
Glucose-Capillary: 170 mg/dL — ABNORMAL HIGH (ref 70–99)
Glucose-Capillary: 167 mg/dL — ABNORMAL HIGH (ref 70–99)
Glucose-Capillary: 159 mg/dL — ABNORMAL HIGH (ref 70–99)

## 2018-07-29 LAB — CBC WITH DIFFERENTIAL/PLATELET
Abs Immature Granulocytes: 0.17 10*3/uL — ABNORMAL HIGH (ref 0.00–0.07)
BASOS ABS: 0 10*3/uL (ref 0.0–0.1)
Basophils Relative: 0 %
Eosinophils Absolute: 0.3 10*3/uL (ref 0.0–0.5)
Eosinophils Relative: 3 %
HCT: 32.7 % — ABNORMAL LOW (ref 36.0–46.0)
Hemoglobin: 9.4 g/dL — ABNORMAL LOW (ref 12.0–15.0)
Immature Granulocytes: 2 %
Lymphocytes Relative: 13 %
Lymphs Abs: 1.4 10*3/uL (ref 0.7–4.0)
MCH: 26.3 pg (ref 26.0–34.0)
MCHC: 28.7 g/dL — ABNORMAL LOW (ref 30.0–36.0)
MCV: 91.3 fL (ref 80.0–100.0)
Monocytes Absolute: 0.7 10*3/uL (ref 0.1–1.0)
Monocytes Relative: 7 %
NRBC: 0.4 % — AB (ref 0.0–0.2)
Neutro Abs: 8 10*3/uL — ABNORMAL HIGH (ref 1.7–7.7)
Neutrophils Relative %: 75 %
Platelets: 383 10*3/uL (ref 150–400)
RBC: 3.58 MIL/uL — ABNORMAL LOW (ref 3.87–5.11)
RDW: 20.6 % — ABNORMAL HIGH (ref 11.5–15.5)
WBC: 10.6 10*3/uL — ABNORMAL HIGH (ref 4.0–10.5)

## 2018-07-29 LAB — BASIC METABOLIC PANEL
Anion gap: 7 (ref 5–15)
BUN: 38 mg/dL — ABNORMAL HIGH (ref 8–23)
CALCIUM: 10.6 mg/dL — AB (ref 8.9–10.3)
CO2: 27 mmol/L (ref 22–32)
Chloride: 113 mmol/L — ABNORMAL HIGH (ref 98–111)
Creatinine, Ser: 0.52 mg/dL (ref 0.44–1.00)
GFR calc non Af Amer: >60 mL/min (ref >60)
Glucose, Bld: 168 mg/dL — ABNORMAL HIGH (ref 70–99)
Potassium: 3.6 mmol/L (ref 3.5–5.1)
Sodium: 147 mmol/L — ABNORMAL HIGH (ref 135–145)

## 2018-07-29 LAB — PHOSPHORUS: Phosphorus: 2.8 mg/dL (ref 2.5–4.6)

## 2018-07-29 MED ORDER — RESOURCE THICKENUP CLEAR PO POWD
ORAL | Status: DC | PRN
  Filled 2018-07-29: qty 125

## 2018-07-29 MED ORDER — MORPHINE SULFATE (PF) 4 MG/ML IV SOLN
INTRAVENOUS | Status: AC
  Filled 2018-07-29: qty 1

## 2018-07-29 MED FILL — Medication: Qty: 1 | Status: AC

## 2018-07-30 ENCOUNTER — Telehealth: Payer: Self-pay | Admitting: Pulmonary Disease

## 2018-07-30 LAB — CULTURE, BLOOD (ROUTINE X 2)
Culture: NO GROWTH
Culture: NO GROWTH
Special Requests: ADEQUATE
Special Requests: ADEQUATE

## 2018-07-30 NOTE — Telephone Encounter (Incomplete)
07/30/18 received D/C from [facility] Sent to Dr. Lake Bells at [facility] to sign. PWR

## 2018-08-04 NOTE — Telephone Encounter (Signed)
Received original D/C signed-D/C mailed to [place] [facility].

## 2018-08-08 NOTE — Progress Notes (Signed)
Pt placed back on full support on previous vent settings.

## 2018-08-08 NOTE — Progress Notes (Signed)
eLink Physician-Brief Progress Note Patient Name: Kristin Moon DOB: 02-14-1946 MRN: [id number]   Date of Service  08/17/18  HPI/Events of Note  AM labs seen. K 3.6  eICU Interventions  Continue care     Intervention Category Minor Interventions: Electrolytes abnormality - evaluation and management  Elmer Sow Aug 17, 2018, 6:06 AM

## 2018-08-08 NOTE — Progress Notes (Signed)
NAME:  Kristin Moon, MRN:  [id number], DOB:  01-31-1946, LOS: 14 ADMISSION DATE:  07/14/2018, CONSULTATION DATE:  07/16/2018 REFERRING MD:  [facility] hospitalist, CHIEF COMPLAINT:  Dyspnea, fever   Brief History   73 y/o female with multiple medical problems admitted after coughig up blood and feeling weak, noted to be flu positive in February.    Past Medical History  Non-hodgkins lymphoma LBBB Diastolic hear failure Hyperlipidemia  Significant Hospital Events   2/7 Admit with fluB infection, pneumococcus PNA, intubated 2/10: CT chest ordered to further evaluate pleural space.  Findings consistent with large left and small to moderate right effusion 2/11: Spiking temperature of 104, cultures resent.  Left chest tube placed.  LFTs significantly elevated, ultrasound ordered.  Ultrasound abdomen negative for acute cholecystitis.   2/12: Still having fevers.  Antibiotics widened, added vancomycin and cefepime.  Discontinued fentanyl drip 2/13: Fever curve is down.  White blood cell count has improved.  Culture still pending.  Continuing nosocomial coverage.  Working on addressing sedation regimen.  Tidal volumes are adequate on spontaneous breathing trial but agitation and deconditioning seem to be a major barrier at this point. 07/23/2018 - TRACH by Dr Molli Knock 2/15 -  post trach and bronch for trach - LLL atx has resolved. Has bilateral effusions.  Volume  +9.2L since admit (improved from +11.3L 2d ago). In sinus since yesterday and off cardizem. Seen by cards - CHADSVAC 4 but recommend holding anticoag till there is recurrence. A Fib felt due to medical issues. Family concnered patient not waking up - despite off fent gtt x 4h. On precedex gtt and scheduled klonopin and seroquel. Moves all 4s per RN but mostly uppers and shakes her head 07/25/2018-volume down to +7.4L. Febrile again 101F . Maintained on fent gtt (currently holiday) with precedex gtt on holiday ->tachypneic and need sedation but able  to track family and squeeze. Diffusely weak. CT head negative 2/17 ongoing fever, neg I/O balance   Procedures:  Endotracheal intubation- 07/16/2018 >> CVC insertion - 07/16/2018 >> removed  Thoracentesis 2/7 > LDH appriox 1200 Bronch, BAL 2/7 Left chest tube 2/11>> R PICC 2/13 >> Trach 2/14 >>  Significant Diagnostic Tests:  Chest x-ray 2/9- slight improvement in dense left lung consolidation.  I have reviewed the images personally. CT chest 2/10: Bibasilar left greater than right pneumonia/consolidation with moderate to large left effusion and small to moderate right effusion. Right upper quadrant ultrasound 2/11>>> some sludge, no significant distention.  No pericholecystic fluid CTH 2/15 >> neg  Micro Data: ( pending)  MRSA PCR 2/7 >> neg Urine strep ag 2/7>> positive  BC 2/6 >> neg RVP 2/7 --> Flu B BAL 2/7 >> negative Left pleural fluid cx 2/7 >> Influenza swab--> Flu B Blood 2/11>>ng Urine 2/11>>>YEAST >100K Left pleural fluid 2/11>>>ng ............... Trach asp 2/11 >>normal flora  Trach asp 2/16 >>  negative BC x 2 2/16 >>   Antimicrobials:  Vancomycin: 2/6 > 2/7; 2/12 >> Cefepime: 2/6 >> 2/10; 2/12 >> Ceftriaxone 2/10 >>> 2/12 Tamiflu 2/5 >> completed 5 days Flagyl 2/16 >> off   Interim history/subjective:  Chest tube remains in place.  Drainage is greater than 250 cc daily  Objective   Blood pressure 140/64, pulse (!) 113, temperature 98.3 F (36.8 C), temperature source Oral, resp. rate (!) 29, height 5\' 2"  (1.575 m), weight 78.5 kg, SpO2 95 %.    Vent Mode: PRVC FiO2 (%):  [35 %-95 %] 95 % Set Rate:  [22 bmp] 22  bmp Vt Set:  [350 mL] 350 mL PEEP:  [5 cmH20] 5 cmH20 Plateau Pressure:  [12 cmH20-16 cmH20] 12 cmH20   Intake/Output Summary (Last 24 hours) at 08-15-2018 1003 Last data filed at August 15, 2018 8295 Gross per 24 hour  Intake 1365.24 ml  Output 560 ml  Net 805.24 ml   Filed Weights   07/27/18 0500 07/28/18 0500 08-15-18 0500    Weight: 75.9 kg 78.5 kg 78.5 kg    Examination:  General: Elderly female no acute distress sitting in chair HEENT: Colostomy in place, currently on trach collar without distress Neuro: Awake alert follows commands CV: Heart sounds are distant PULM: Decreased breath sounds in the bases AO:ZHYQ, non-tender, bsx4 active  Extremities: warm/dry, 1+ edema  Skin: no rashes or lesions    Resolved Hospital Problem list   transaminitis Sludge in gallbladder   Assessment & Plan:  Acute on chronic respiratory failure with hypoxemia Tolerated tracheostomy 16 hours on 07/28/2018 continue to push trach weaning Routine trach care Large left pleural effusion: slight re-accumulation on this morning's X-ray? Pneumothorax left: resolved, continues to have greater than 200 cc a day drainage therefore would not remove Monitor chest tube drainage for gets less than 100 cc a day consider discontinuing   Atrial fibrillation with RVR Monitor Heart rate currently 104  Influenza and pneumococcal pneumonia with unexplained ongoing fevers Monitor off antibiotics   Physical deconditioning PT is following  Hypothyroidism Continue Synthroid  Anemia of critical illness Recent Labs    07/28/18 0319 2018/08/15 0242  HGB 9.2* 9.4*    Monitor for bleeding No overt bleeding  Hypernatremia:  Recent Labs  Lab 07/27/18 0500 07/28/18 0319 08/15/18 0242  NA 148* 151* 147*    Continue free water  Best practice:  Diet: NPO Pain/Anxiety/Delirium protocol (if indicated): no VAP protocol (if indicated): yes DVT prophylaxis: sub q hep GI prophylaxis: famotidine Glucose control: SSI Mobility: PT consult Code Status: full Family Communication: 10/02/202019 no family at bedside at time of examination Disposition:   Labs   CBC: Recent Labs  Lab 07/25/18 1540 07/26/18 0420 07/27/18 0500 07/28/18 0319 08/15/18 0242  WBC 9.8 13.3* 11.7* 10.4 10.6*  NEUTROABS 8.6* 10.9* 9.2* 8.3* 8.0*  HGB  8.6* 9.7* 9.5* 9.2* 9.4*  HCT 29.9* 32.1* 32.5* 31.5* 32.7*  MCV 87.2 87.0 89.3 91.3 91.3  PLT 284 356 445* 406* 383    Basic Metabolic Panel: Recent Labs  Lab 07/23/18 0253 07/24/18 0422 07/25/18 0539 07/26/18 0016 07/26/18 0420 07/27/18 0500 07/28/18 0319 08-15-18 0242  NA 142 141  --  145 145 148* 151* 147*  K 3.5 3.5  --  2.9* 3.3* 3.4* 3.0* 3.6  CL 105 103  --  102 104 110 114* 113*  CO2 28 30  --  33* 34* 30 29 27   GLUCOSE 183* 163*  --  202* 192* 236* 229* 168*  BUN 30* 29*  --  36* 35* 39* 39* 38*  CREATININE 0.69 0.70  --  0.79 0.67 0.83 0.61 0.52  CALCIUM 9.4 9.0  --  9.5 9.6 10.1 10.5* 10.6*  MG 1.7 2.1 2.3  --  2.1 2.6*  --   --   PHOS 3.5  --   --   --  2.2* 2.4* 2.3* 2.8   GFR: Estimated Creatinine Clearance: 63.5 mL/min (by C-G formula based on SCr of 0.52 mg/dL). Recent Labs  Lab 07/25/18 1228  07/26/18 0016 07/26/18 0420 07/27/18 0500 07/28/18 0319 2018/08/15 0242  PROCALCITON 1.06  --   --  0.99 0.79  --   --   WBC  --    < >  --  13.3* 11.7* 10.4 10.6*  LATICACIDVEN 1.3  --  1.1  --   --   --   --    < > = values in this interval not displayed.    Liver Function Tests: Recent Labs  Lab 07/23/18 0253 07/24/18 0422 07/25/18 0539 07/28/18 0319  AST 139* 83* 52* 124*  ALT 649* 430* 264* 203*  ALKPHOS 166* 204* 249* 326*  BILITOT 0.9 0.6 0.8 0.4  PROT 5.2* 5.1* 5.9* 5.8*  ALBUMIN 1.6* 1.7* 1.9* 2.0*   No results for input(s): LIPASE, AMYLASE in the last 168 hours. No results for input(s): AMMONIA in the last 168 hours.  ABG    Component Value Date/Time   PHART 7.453 (H) 07/22/2018 1847   PCO2ART 39.3 07/22/2018 1847   PO2ART 130.0 (H) 07/22/2018 1847   HCO3 27.5 07/22/2018 1847   TCO2 29 07/22/2018 1847   ACIDBASEDEF 1.0 07/22/2018 0353   O2SAT 99.0 07/22/2018 1847     Coagulation Profile: No results for input(s): INR, PROTIME in the last 168 hours.  Cardiac Enzymes: Recent Labs  Lab 07/22/18 1515 07/22/18 2203 07/23/18 0253  07/23/18 0830  TROPONINI 0.05* 0.06* 0.10* 0.10*    HbA1C: No results found for: HGBA1C  CBG: Recent Labs  Lab 07/28/18 1545 07/28/18 1923 07/28/18 2343 08/04/2018 0339 07/13/2018 0828  GLUCAP 148* 120* 183* 170* 167*     Critical care time:     Brett Canales Xariah Silvernail ACNP [facility]ack PCCM Pager [phone] till 1 pm If no answer page [phone] 08/03/2018, 10:04 AM

## 2018-08-08 NOTE — Procedures (Signed)
Objective Swallowing Evaluation: Type of Study: FEES-Fiberoptic Endoscopic Evaluation of Swallow   Patient Details  Name: Kristin Moon MRN: [id number] Date of Birth: 06-18-1945  Today's Date: 08/20/2018 Time: SLP Start Time (ACUTE ONLY): 1210 -SLP Stop Time (ACUTE ONLY): 1237  SLP Time Calculation (min) (ACUTE ONLY): 27 min   Past Medical History:  Past Medical History:  Diagnosis Date  . Chronic diastolic CHF (congestive heart failure) ([place])   . Diastolic heart failure    Grade 1 per echo May 2012  . Hyperlipidemia   . Hypertension   . Hypothyroidism   . Left bundle branch block   . Non Hodgkin's lymphoma ([place])    history  . Non Hodgkin's lymphoma ([place])   . S/P right and left heart catheterization May 2012   minimal CAD. EF of 50%  . Transaminitis    Past Surgical History:  Past Surgical History:  Procedure Laterality Date  . ABDOMINAL HYSTERECTOMY  1983  . CARDIAC CATHETERIZATION  10/17/2010    Left ventriculography shows mild global left ventricular hypokinesisThe LVEF is estimated at 50%.  [facility] FOOT FRACTURE SURGERY     HPI: 73 yr old female w/ PMHx of NHL, HLD, HTN, Hypothyroidism, LBBB, CAD G1DD presented from home via EMS after coughing up blood and feeling weak, Tested flu positive at her PMD visit on 07/14/2018 and started on Tamiflu as outpatient but continued to worsen. Failed BiPAP treatment in ED, intubated for acute respiratory failure on 2/7-2/14 trach placed.    Subjective: Pt upright in bed, pleasant and cooeprative    Assessment / Plan / Recommendation  CH[facility] IP CLINICAL IMPRESSIONS August 20, 2018  Clinical Impression Pt demonstrates moderate oropharyngeal dysphagia following prolonged intubation with trach placement and deconditioning. At time of exam pt had been up in chair for 7 hours and appeared fatigued with RR in the 30s, no PMSV in place due to intolerance. Primary impariments included intermittent delayed epiglottic deflection/initiation likely due to  impaired timing of closure in setting of respiratory fatigue. Presumed silent aspiration of thin liquids before the swallow with green dye visible in subglottis without immediate response (delayed cough observed). Nectar thick liquids via cup tolerated relatively well though as study progressed residuals with puree, honey and even nectar increasingly evident. Pt unable to complete compnesatory stragies with min cueing. Recommend pt initiate sips of nectar thick liquids for pleasure and to facilitate swallow rehabilitation. Pt may also have a few bits of puree daily. Will need to monitor for improved endurance with PO and respiratory stability for potential diet upgrade.   SLP Visit Diagnosis Dysphagia, oropharyngeal phase (R13.12)  Attention and concentration deficit following --  Frontal lobe and executive function deficit following --  Impact on safety and function Moderate aspiration risk      CH[facility] IP TREATMENT RECOMMENDATION 2018-08-20  Treatment Recommendations Therapy as outlined in treatment plan below     Prognosis 07/28/2018  Prognosis for Safe Diet Advancement Good  Barriers to Reach Goals --  Barriers/Prognosis Comment --    CH[facility] IP DIET RECOMMENDATION August 20, 2018  SLP Diet Recommendations Alternative means - temporary;Nectar thick liquid  Liquid Administration via --  Medication Administration Via alternative means  Compensations --  Postural Changes --      CH[facility] IP OTHER RECOMMENDATIONS 08/20/18  Recommended Consults --  Oral Care Recommendations --  Other Recommendations Order thickener from pharmacy      CH[facility] IP FOLLOW UP RECOMMENDATIONS 20-Aug-2018  Follow up Recommendations Inpatient Rehab      CH[facility] IP FREQUENCY  AND DURATION 2018/08/05  Speech Therapy Frequency (ACUTE ONLY) min 2x/week  Treatment Duration 2 weeks           CH[facility] IP ORAL PHASE 2018/08/05  Oral Phase WFL  Oral - Pudding Teaspoon --  Oral - Pudding Cup --  Oral - Honey Teaspoon --  Oral - Honey Cup --   Oral - Nectar Teaspoon --  Oral - Nectar Cup --  Oral - Nectar Straw --  Oral - Thin Teaspoon --  Oral - Thin Cup --  Oral - Thin Straw --  Oral - Puree --  Oral - Mech Soft --  Oral - Regular --  Oral - Multi-Consistency --  Oral - Pill --  Oral Phase - Comment --    CH[facility] IP PHARYNGEAL PHASE 2018/08/05  Pharyngeal Phase Impaired  Pharyngeal- Pudding Teaspoon --  Pharyngeal --  Pharyngeal- Pudding Cup --  Pharyngeal --  Pharyngeal- Honey Teaspoon Pharyngeal residue - pyriform;Pharyngeal residue - valleculae;Lateral channel residue  Pharyngeal --  Pharyngeal- Honey Cup --  Pharyngeal --  Pharyngeal- Nectar Teaspoon --  Pharyngeal --  Pharyngeal- Nectar Cup WFL;Pharyngeal residue - valleculae;Lateral channel residue  Pharyngeal --  Pharyngeal- Nectar Straw Pharyngeal residue - pyriform;Pharyngeal residue - valleculae;Lateral channel residue;Penetration/Aspiration before swallow;Delayed swallow initiation-pyriform sinuses  Pharyngeal Material does not enter airway;Material enters airway, CONTACTS cords and then ejected out  Pharyngeal- Thin Teaspoon --  Pharyngeal --  Pharyngeal- Thin Cup Penetration/Aspiration before swallow;Penetration/Aspiration during swallow;Trace aspiration  Pharyngeal Material enters airway, passes BELOW cords without attempt by patient to eject out (silent aspiration)  Pharyngeal- Thin Straw --  Pharyngeal --  Pharyngeal- Puree Pharyngeal residue - pyriform;Pharyngeal residue - valleculae;Lateral channel residue;Compensatory strategies attempted (with notebox)  Pharyngeal --  Pharyngeal- Mechanical Soft --  Pharyngeal --  Pharyngeal- Regular --  Pharyngeal --  Pharyngeal- Multi-consistency --  Pharyngeal --  Pharyngeal- Pill --  Pharyngeal --  Pharyngeal Comment --     No flowsheet data found.  [name], MA CCC-SLP  Acute Rehabilitation Services Pager [phone] Office [phone]  Lynann Beaver 05-Aug-2018, 1:07 PM

## 2018-08-08 NOTE — Death Summary Note (Signed)
DEATH SUMMARY   Patient Details  Name: Kristin Moon MRN: [id number] DOB: Nov 16, 1945  Admission/Discharge Information   Admit Date:  August 05, 2018  Date of Death: Date of Death: 2018/08/19  Time of Death: Time of Death: 1348-05-19  Length of Stay: 05/14/23  Referring Physician: Georgianne Fick, MD   Reason(s) for Hospitalization  Shortness of breath  Diagnoses  Preliminary cause of death:   Streptococcal pneumonia Acute respiratory failure with hypoxemia Influenza Secondary Diagnoses (including complications and co-morbidities):  Active Problems:   Sepsis (HCC)   Acute respiratory failure (HCC)   Septic shock (HCC)   Hypoxia   Endotracheally intubated   Acute respiratory distress syndrome in adult [facility])   Encounter for central line care   Thoracostomy tube in place   Community acquired pneumonia of left lower lobe of lung (HCC)   Status post tracheostomy [facility])   Brief Hospital Course (including significant findings, care, treatment, and services provided and events leading to death)  Kristin Moon is a 73 y.o. year old female who had recently been diagnosed and treated for influenza as an outpatient who was admitted to our facility with a large left-sided pleural effusion and pneumonia causing profound shortness of breath and acute respiratory failure with hypoxemia.  She required intubation and mechanical ventilation.  She was unable to be weaned from the ventilator so a tracheostomy was placed.  She also had a chest tube placed for a large left pleural effusion which continue to drain large amounts of fluid.  After several weeks her condition slowly improved and she was able to interact with [place] and her family during the week of February 17.  However on 19-Aug-2018 she suddenly developed atrial fibrillation with rapid response and then a respiratory arrest.  She then developed a cardiac arrest.  CPR efforts were performed but were ineffective.  She died with her family at the  bedside.    Pertinent Labs and Studies  Significant Diagnostic Studies Ct Head Wo Contrast  Result Date: 07/24/2018 CLINICAL DATA:  Encephalopathy. EXAM: CT HEAD WITHOUT CONTRAST TECHNIQUE: Contiguous axial images were obtained from the base of the skull through the vertex without intravenous contrast. COMPARISON:  None. FINDINGS: Brain: There is no evidence of acute infarct, intracranial hemorrhage, mass, midline shift, or extra-axial fluid collection. The ventricles and sulci are within normal limits for age. Vascular: No hyperdense vessel. Skull: No fracture or focal osseous lesion. Sinuses/Orbits: Mild sphenoid sinus mucosal thickening. Small bilateral mastoid effusions. Unremarkable orbits. Other: Partially visualized nasoenteric tube. IMPRESSION: Unremarkable CT appearance of the brain. Electronically Signed   By: Sebastian Ache M.D.   On: 07/24/2018 21:45   Ct Chest Wo Contrast  Result Date: 07/19/2018 CLINICAL DATA:  Pleural effusion EXAM: CT CHEST WITHOUT CONTRAST TECHNIQUE: Multidetector CT imaging of the chest was performed following the standard protocol without IV contrast. COMPARISON:  07/19/2018, 07/18/2018, 07/17/2018 radiograph, CT chest 10/16/2010 FINDINGS: Cardiovascular: Limited evaluation without intravenous contrast. Nonaneurysmal aorta. Moderate aortic atherosclerosis. Heart size within normal limits. Trace pericardial effusion. Left-sided central venous catheter tip at the distal brachiocephalic region. Mediastinum/Nodes: Endotracheal tube tip about a cm superior to the carina. Esophageal tube tip below the diaphragm but non included. No thyroid mass. Dense calcification to the left of the pulmonary trunk presumably a calcified node. Lungs/Pleura: Small moderate right pleural effusion. Moderate to large left pleural effusion. Diffuse consolidation throughout the left lung. Partial consolidation and ground-glass density in the right upper lobe and right lower lobe. Upper Abdomen: 24  mm rounded hypodensity within the posterior spleen. No acute abnormality in the upper abdomen. Musculoskeletal: Degenerative changes without acute or suspicious abnormality. IMPRESSION: 1. Small moderate right pleural effusion and moderate to large left pleural effusion. 2. Diffuse consolidation within the left lung with partial consolidation and ground-glass density in the right upper and lower lobes suspicious for multifocal pneumonia 3. Endotracheal tube tip just above the carina. 4. 24 mm rounded hypodensity within the spleen, incompletely characterized. Consider further evaluation with MRI or contrast-enhanced exam when clinically feasible. Aortic Atherosclerosis (ICD10-I70.0). Electronically Signed   By: Jasmine Pang M.D.   On: 07/19/2018 17:50   Dg Chest Port 1 View  Result Date: 07/28/2018 CLINICAL DATA:  Pneumothorax follow-up EXAM: PORTABLE CHEST 1 VIEW COMPARISON:  Yesterday FINDINGS: Tracheostomy tube that remains well seated. Right upper extremity PICC and left chest tube in stable unremarkable position. The feeding tube at least reaches the stomach. Stable trace left apical pneumothorax. Stable low volumes and interstitial crowding. Stable heart size. IMPRESSION: Stable hardware positioning and trace left apical pneumothorax. Electronically Signed   By: Marnee Spring M.D.   On: 07/28/2018 10:13   Dg Chest Port 1 View  Result Date: 07/27/2018 CLINICAL DATA:  Tiny apical pneumothorax EXAM: PORTABLE CHEST 1 VIEW COMPARISON:  07/27/2018 FINDINGS: Smaller left apical pneumothorax now projecting up to the posterior second rib level, previously at the third rib level. Reposition left-sided chest tube is noted with tip overlying the posterior fifth rib. No mediastinal shift. Tracheostomy tube is in place. A weighted feeding tube projects below the left hemidiaphragm. The tip is excluded on this study. Chronic mild interstitial prominence is noted slightly decreased in appearance. Right-sided PICC  line tip terminates at the cavoatrial juncture. Tracheostomy tube tip projects over the trachea at the level of the aortic arch. IMPRESSION: Smaller left apical pneumothorax. Left-sided chest tube in place. Satisfactory support line and tube positions to the extent visualized. Electronically Signed   By: Tollie Eth M.D.   On: 07/27/2018 19:23   Dg Chest Port 1 View  Result Date: 07/27/2018 CLINICAL DATA:  Respiratory failure. EXAM: PORTABLE CHEST 1 VIEW COMPARISON:  07/26/2018.  07/24/2017. FINDINGS: Tracheostomy tube, feeding tube, right PICC line in stable position. Left chest tube in stable position. Heart size stable. Bibasilar atelectasis/infiltrates again noted. No pleural effusion. Tiny left apical pneumothorax noted on today's exam. IMPRESSION: 1. Left chest tube in stable position. Tiny left apical pneumothorax noted on today's exam. 2.  Remaining lines and tubes stable position. 3.  Bibasilar atelectasis/infiltrates again noted. Critical Value/emergent results were called by telephone at the time of interpretation on 07/27/2018 at 7:32 am to nurse Ivor Messier, who verbally acknowledged these results. Electronically Signed   By: Maisie Fus  Register   On: 07/27/2018 07:33   Dg Chest Port 1 View  Result Date: 07/26/2018 CLINICAL DATA:  Tracheostomy.  Left chest tube. EXAM: PORTABLE CHEST 1 VIEW COMPARISON:  07/24/2018 FINDINGS: Tracheostomy, soft feeding tube, left chest tube and right arm PICC remain in place, unchanged. No left pleural air is visible. Slight improvement in aeration of both lower lobes. IMPRESSION: Lines and tubes unchanged in satisfactory. No visible left pneumothorax. Slightly improved lower lobe aeration. Electronically Signed   By: Paulina Fusi M.D.   On: 07/26/2018 06:32   Dg Chest Port 1 View  Result Date: 07/24/2018 CLINICAL DATA:  Chronic ventilator dependent respiratory failure. Follow-up BILATERAL pneumonia. EXAM: PORTABLE CHEST 1 VIEW COMPARISON:  07/23/2018 and earlier,  including CT chest 07/19/2018. FINDINGS: Tracheostomy tube  tip in satisfactory position projecting below the thoracic inlet approximately 4 cm above the carina. RIGHT arm PICC tip projects at or near the cavoatrial junction. Feeding tube courses below the diaphragm into the stomach though its tip is not included on the image. LEFT chest tube remains in place with no pneumothorax and no significant residual LEFT pleural effusion. Significant improvement in aeration in the LEFT lung since yesterday, with residual consolidation and air bronchograms in the INFERIOR LEFT UPPER LOBE. Stable small RIGHT pleural effusion and associated consolidation in the RIGHT LOWER LOBE. Cardiac silhouette mildly to moderately enlarged, unchanged. Mild pulmonary venous hypertension without overt edema. IMPRESSION: 1.  Support apparatus satisfactory. 2. Improved aeration in the LEFT lung since yesterday, with resolution of LEFT LOWER LOBE atelectasis and/or pneumonia and improved LEFT UPPER LOBE pneumonia. Moderate residual airspace pneumonia with air bronchograms in the LEFT UPPER LOBE. 3. Stable small RIGHT pleural effusion and associated passive atelectasis and/or pneumonia involving the RIGHT LOWER LOBE. 4. No new abnormalities. Electronically Signed   By: Hulan Saas M.D.   On: 07/24/2018 08:25   Dg Chest Port 1 View  Result Date: 07/23/2018 CLINICAL DATA:  Tracheostomy placement. EXAM: PORTABLE CHEST 1 VIEW COMPARISON:  Radiograph of same day. FINDINGS: Stable cardiomegaly with central pulmonary vascular congestion. Endotracheal and nasogastric tubes have been removed. Tracheostomy tube appears to be in grossly good position. Right-sided PICC line is unchanged in position. Left-sided chest tube is noted with mild left apical pneumothorax. Increased left midlung and basilar opacity is noted concerning for atelectasis or pneumonia. Stable right basilar atelectasis is noted. Bony thorax is unremarkable. IMPRESSION: Stable  position of left-sided chest tube with mild left apical pneumothorax. Tracheostomy tube is in grossly good position. Increased left lung opacity is noted as described above. Electronically Signed   By: Lupita Raider, M.D.   On: 07/23/2018 12:54   Dg Chest Port 1 View  Result Date: 07/23/2018 CLINICAL DATA:  Follow-up pneumonia EXAM: PORTABLE CHEST 1 VIEW COMPARISON:  Yesterday FINDINGS: Endotracheal tube tip at the clavicular heads. The orogastric tube at least reaches the stomach. Left chest tube in stable position. Stable indistinct opacities at the bases and hazy density on the left in the mid chest. Calcified left hilar lymph nodes. The left IJ line has been removed. Possible trace left apical pneumothorax. IMPRESSION: 1. History of pneumonia with stable bilateral lung opacity. 2. Probable trace left apical pneumothorax with chest tube in stable position. Electronically Signed   By: Marnee Spring M.D.   On: 07/23/2018 07:44   Dg Chest Port 1 View  Result Date: 07/22/2018 CLINICAL DATA:  Increased respirations. EXAM: PORTABLE CHEST 1 VIEW COMPARISON:  Radiograph of July 22, 2018. FINDINGS: Stable cardiomediastinal silhouette. Endotracheal and nasogastric tubes are unchanged in position. Left-sided chest tube is noted without pneumothorax. Left internal jugular catheter is unchanged in position. Interval placement of right-sided PICC line with distal tip in expected position of cavoatrial junction. Mild bibasilar subsegmental atelectasis is noted with probable small pleural effusions. Bony thorax is unremarkable. IMPRESSION: Stable support apparatus. Left-sided chest tube is noted without pneumothorax. Interval placement of right-sided PICC line with distal tip in expected position of cavoatrial junction. Stable bibasilar subsegmental atelectasis with small pleural effusions. Electronically Signed   By: Lupita Raider, M.D.   On: 07/22/2018 19:59   Dg Chest Port 1 View  Result Date:  07/22/2018 CLINICAL DATA:  Pneumonia EXAM: PORTABLE CHEST 1 VIEW COMPARISON:  Yesterday FINDINGS: Endotracheal tube tip at the  clavicular heads. Orogastric tube at least reaches the stomach. Left IJ line with tip at the right brachiocephalic vein, unchanged. Left-sided chest tube in stable position. History of pneumonia with hazy bilateral lung opacity. No convincing change from prior when allowing for differences in technique. No visible pneumothorax. Stable heart size. IMPRESSION: 1. Stable hardware positioning. 2. No convincing change in left more than right pneumonia. Electronically Signed   By: Marnee Spring M.D.   On: 07/22/2018 07:35   Dg Chest Port 1 View  Result Date: 07/21/2018 CLINICAL DATA:  73 year old female with abnormal LFTs. Intubated, pleural effusion and airspace opacity. EXAM: PORTABLE CHEST 1 VIEW COMPARISON:  07/20/2018 and earlier. FINDINGS: Portable AP semi upright view at 0507 hours. Endotracheal tube tip in good position just below the clavicles. Enteric tube courses to the abdomen, tip not included. Left chest tube remains in place. Substantially improved bilateral ventilation since the CT on 07/19/2018, and further improved left lung base ventilation since yesterday. Residual confluent left mid lung opacity. Residual streaky opacity at the right lung base. No pneumothorax or pulmonary edema. Normal cardiac size and mediastinal contours. Paucity of bowel gas in the upper abdomen. IMPRESSION: 1. Stable lines and tubes. 2. Further improved ventilation since yesterday. Residual left mid lung and right lung base opacity. Electronically Signed   By: Odessa Fleming M.D.   On: 07/21/2018 08:15   Dg Chest Port 1 View  Result Date: 07/20/2018 CLINICAL DATA:  Chest tube inserted, check position EXAM: PORTABLE CHEST - 1 VIEW COMPARISON:  Earlier film of the same day FINDINGS: Interval placement of left chest tube directed towards the suprahilar region. Interval decrease in left pleural effusion.  Probable small residual lateral left pleural effusion. No pneumothorax. Patchy airspace opacities persist in the mid to lower left lung, with improved aeration. Calcified left hilar lymph node. Right basilar airspace opacity stable. The endotracheal tube tip is less than 2 cm above the carina. Gastric tube and left IJ central line stable in position. Heart size and mediastinal contours are within normal limits. Visualized bones unremarkable. IMPRESSION: Left chest tube placement with decrease in left pleural effusion. No pneumothorax. Electronically Signed   By: Corlis Leak M.D.   On: 07/20/2018 14:45   Dg Chest Port 1 View  Result Date: 07/20/2018 CLINICAL DATA:  73 year old female with history of pneumonia. EXAM: PORTABLE CHEST 1 VIEW COMPARISON:  Chest x-ray 07/19/2018. FINDINGS: There is a left-sided internal jugular central venous catheter with tip terminating in the proximal superior vena cava. An endotracheal tube is in place with tip 2.9 cm above the carina. A nasogastric tube is seen extending into the stomach, however, the tip of the nasogastric tube extends below the lower margin of the image. Dense airspace consolidation throughout much of the mid to left lower lung. Opacity at the right lung base which may reflect an additional area of atelectasis and/or airspace consolidation. Small right pleural effusion. Moderate to large left pleural effusion. No evidence of pulmonary edema. Cardiac silhouette is largely obscured. Aortic atherosclerosis. Surgical clips projecting over the mid left hemithorax. IMPRESSION: 1. Support apparatus, as above. 2. Persistent bilateral pleural effusions (left greater than right) with areas of atelectasis and/or consolidation throughout the lungs bilaterally, most severe throughout the left lung, as detailed above. Electronically Signed   By: Trudie Reed M.D.   On: 07/20/2018 07:01   Dg Chest Port 1 View  Result Date: 07/19/2018 CLINICAL DATA:  Followup ventilator  support.  Respiratory failure. EXAM: PORTABLE CHEST 1 VIEW  COMPARISON:  07/18/2018 FINDINGS: Endotracheal tube tip is 2 cm above the carina. Nasogastric tube enters the stomach. Left internal jugular central line tip is at the junction of the innominate vein and SVC. Persistent and possibly slightly increased effusions and atelectasis, left more than right. IMPRESSION: Persistent and possibly slightly increased effusions and atelectasis, left more than right. Electronically Signed   By: Paulina Fusi M.D.   On: 07/19/2018 06:49   Dg Chest Port 1 View  Result Date: 07/18/2018 CLINICAL DATA:  Respiratory failure. EXAM: PORTABLE CHEST 1 VIEW COMPARISON:  07/17/2018 FINDINGS: The endotracheal tube is 2.7 cm above the carina. The NG tube is coursing down the esophagus and into the stomach. The left IJ catheter is stable with the tip at the brachiocephalic SVC junction. Persistent left lung opacity but slight decrease in overall consolidation. The right lung remains relatively clear. The heart is stable in size. IMPRESSION: 1. Stable support apparatus. 2. Persistent left lung airspace process with interval slightly less consolidation. Electronically Signed   By: Rudie Meyer M.D.   On: 07/18/2018 05:36   Dg Chest Port 1 View  Result Date: 07/17/2018 CLINICAL DATA:  Acute respiratory failure EXAM: PORTABLE CHEST 1 VIEW COMPARISON:  07/16/2018 FINDINGS: Endotracheal tube is in place, tip 2.4 centimeters above the carina. Nasogastric tube is in place, tip beyond the gastroesophageal junction off the image. A LEFT IJ central line tip overlies the confluence of the RIGHT brachiocephalic with the superior vena cava. There is persistent dense opacity in the LEFT lung, sparing the LEFT lung apex. Surgical clips are identified in the LEFT hilar region. No focal consolidations identified within the RIGHT lung. IMPRESSION: 1. Persistent significant LEFT lung opacity. 2. Stable lines and tubes. Electronically Signed   By:  Norva Pavlov M.D.   On: 07/17/2018 08:19   Dg Chest Port 1 View  Result Date: 07/16/2018 CLINICAL DATA:  Intubation post bronchoscopy EXAM: PORTABLE CHEST 1 VIEW COMPARISON:  07/16/2018 FINDINGS: Endotracheal tube in good position. Left jugular central venous catheter in the SVC. NG tube enters the stomach. No pneumothorax. Surgical clips left hilum. Left lower lobe dense consolidation has progressed. Right lung remains clear. No edema. IMPRESSION: Endotracheal tube in good position Dense consolidation left lower lobe has progressed in the interval. No pneumothorax. Electronically Signed   By: Marlan Palau M.D.   On: 07/16/2018 16:20   Dg Chest Port 1 View  Result Date: 07/16/2018 CLINICAL DATA:  Dyspnea, thoracentesis EXAM: PORTABLE CHEST 1 VIEW COMPARISON:  Portable exam 1144 hours compared to 07/16/2018 at 0207 hours FINDINGS: Tip of endotracheal tube projects 11 mm above carina. Nasogastric tube extends into stomach. LEFT jugular central venous catheter with tip projecting over confluence of the SVC directed slightly towards the RIGHT subclavian vein. EKG leads project over chest. Calcified adenopathy at LEFT hilum. Persistent consolidation of LEFT lung with decreased pleural effusion. RIGHT lung clear. No pneumothorax or acute osseous findings. IMPRESSION: Persistent LEFT lung infiltrates consistent with pneumonia with decrease in LEFT pleural effusion since prior exam. No pneumothorax. Electronically Signed   By: Ulyses Southward M.D.   On: 07/16/2018 12:14   Dg Chest Port 1 View  Result Date: 07/16/2018 CLINICAL DATA:  Central line placement EXAM: PORTABLE CHEST 1 VIEW COMPARISON:  Earlier same day FINDINGS: Endotracheal tube with the tip 2.1 cm above the carina. Interval placement of a left jugular central venous catheter with the tip at the confluence of the brachiocephalic vein-SVC. Nasogastric tube coursing below the diaphragm. Large left  pleural effusion and left upper and lower lobe airspace  disease with only a small area of aerated lung at the left lung apex. Right lung is clear. No pneumothorax. Stable cardiomediastinal silhouette. IMPRESSION: 1. Interval placement of a left jugular central venous catheter with the tip at the confluence of the brachiocephalic vein-SVC. 2. Large left pleural effusion and left upper and lower lobe airspace disease with only a small area of aerated lung at the left lung apex. Electronically Signed   By: Elige Ko   On: 07/16/2018 02:23   Dg Chest Port 1 View  Result Date: 07/16/2018 CLINICAL DATA:  Endotracheal and OG tube placements EXAM: PORTABLE CHEST 1 VIEW COMPARISON:  07/16/2018 FINDINGS: Endotracheal tube placed with tip measuring 3.2 cm above the carina. Enteric tube tip is off the field of view but below the left hemidiaphragm. Dense consolidation in the left mid and lower lung with air bronchograms. This is likely pneumonia. Calcifications and surgical clips in the left hilar region. Right lung is clear and expanded. Heart size is obscured by the left parenchymal process. IMPRESSION: Appliances appear in satisfactory location. Dense consolidation in the left mid and lower lung with air bronchograms likely representing pneumonia. Electronically Signed   By: Burman Nieves M.D.   On: 07/16/2018 01:32   Dg Chest Port 1 View  Result Date: 08/04/2018 CLINICAL DATA:  73 year old female with suspected sepsis. Hemoptysis. Diagnosed with flu yesterday. EXAM: PORTABLE CHEST 1 VIEW COMPARISON:  02/16/2018 and earlier. FINDINGS: Portable AP semi upright view at 1931 hours. Large area of consolidation throughout the left lung, only sparing the left lung apex. Air is visible in the left mainstem bronchus and its bifurcation. Visible mediastinal contours appear stable. Visualized tracheal air column is within normal limits. The right lung appears clear allowing for portable technique. No pneumothorax. No pleural effusion is evident. Paucity bowel gas in the upper  abdomen. IMPRESSION: 1. Multilobar left lung Pneumonia.  No pleural effusion is evident. 2. Right lung appears clear. Electronically Signed   By: Odessa Fleming M.D.   On: 07/25/2018 20:07   Dg Abd Portable 1v  Result Date: 07/23/2018 CLINICAL DATA:  Evaluate feeding tube EXAM: PORTABLE ABDOMEN - 1 VIEW COMPARISON:  None. FINDINGS: The feeding tube terminates in the right lower quadrant of the abdomen, likely in the distal stomach. IMPRESSION: The feeding tube terminates in the right lower quadrant. The tip is favored to be in the distal aspect of the distended stomach. Electronically Signed   By: Gerome Sam III M.D   On: 07/23/2018 16:28   [place] Ekg Site Rite  Result Date: 07/22/2018 If Site Rite image not attached, placement could not be confirmed due to current cardiac rhythm.  US Abdomen Limited Ruq  Result Date: 07/21/2018 CLINICAL DATA:  73 year old female with abnormal LFTs. Intubated, pleural effusion and airspace opacity. EXAM: ULTRASOUND ABDOMEN LIMITED RIGHT UPPER QUADRANT COMPARISON:  Chest CT without contrast 07/19/2018. Lumbar MRI 06/07/2018. FINDINGS: Gallbladder: Borderline to mild gallbladder wall thickening. Possible dependent sludge, but the gallbladder lumen appears free of echogenic stones. No pericholecystic fluid. Sonographic Eulah Pont sign could not be evaluated as the patient is intubated. Common bile duct: Diameter: 3 millimeters, normal. Liver: No focal lesion identified. Liver echogenicity at the upper limits of normal. Portal vein is patent on color Doppler imaging with normal direction of blood flow towards the liver. Other findings: Negative visible right kidney. No free fluid in the lower quadrants. IMPRESSION: 1. Gallbladder sludge with borderline to mild wall thickening.  2. No evidence of bile duct obstruction. Liver within normal limits. Electronically Signed   By: Odessa Fleming M.D.   On: 07/21/2018 08:19    Microbiology Recent Results (from the past 240 hour(s))  Urine Culture      Status: Abnormal   Collection Time: 07/20/18 10:46 AM  Result Value Ref Range Status   Specimen Description URINE, CATHETERIZED  Final   Special Requests   Final    NONE Performed at [facility], [address], [place], [place] [address]    Culture >=100,000 COLONIES/mL YEAST (A)  Final   Report Status 07/21/2018 FINAL  Final  Culture, blood (Routine X 2) w Reflex to ID Panel     Status: None   Collection Time: 07/20/18 10:59 AM  Result Value Ref Range Status   Specimen Description BLOOD LEFT HAND  Final   Special Requests   Final    BOTTLES DRAWN AEROBIC AND ANAEROBIC Blood Culture adequate volume   Culture   Final    NO GROWTH 5 DAYS Performed at [facility], [address] [place], [place] [address]    Report Status 07/25/2018 FINAL  Final  Culture, blood (Routine X 2) w Reflex to ID Panel     Status: None   Collection Time: 07/20/18 11:11 AM  Result Value Ref Range Status   Specimen Description BLOOD RIGHT HAND  Final   Special Requests   Final    BOTTLES DRAWN AEROBIC ONLY Blood Culture results may not be optimal due to an inadequate volume of blood received in culture bottles   Culture   Final    NO GROWTH 5 DAYS Performed at [facility], [address], [place], [place] [address]    Report Status 07/25/2018 FINAL  Final  Culture, respiratory (non-expectorated)     Status: None   Collection Time: 07/20/18 11:16 AM  Result Value Ref Range Status   Specimen Description TRACHEAL ASPIRATE  Final   Special Requests NONE  Final   Gram Stain   Final    ABUNDANT WBC PRESENT,BOTH PMN AND MONONUCLEAR ABUNDANT GRAM NEGATIVE RODS MODERATE GRAM POSITIVE COCCI RARE GRAM POSITIVE RODS    Culture   Final    MODERATE Consistent with normal respiratory flora. Performed at [facility], [address], [place], [place] [address]    Report Status 07/22/2018 FINAL  Final  Gram stain     Status: None   Collection Time: 07/20/18  2:23 PM  Result Value  Ref Range Status   Specimen Description FLUID PLEURAL  Final   Special Requests   Final    NONE Performed at [facility], [address], [place], [place] [address]    Gram Stain   Final    WBC PRESENT,BOTH PMN AND MONONUCLEAR NO ORGANISMS SEEN CYTOSPIN SMEAR    Report Status 07/20/2018 FINAL  Final  Culture, body fluid-bottle     Status: None   Collection Time: 07/20/18  2:23 PM  Result Value Ref Range Status   Specimen Description FLUID PLEURAL  Final   Special Requests BOTTLES DRAWN AEROBIC AND ANAEROBIC  Final   Culture   Final    NO GROWTH 5 DAYS Performed at [facility], [address], [place], [place] [address]    Report Status 07/25/2018 FINAL  Final  Culture, respiratory (non-expectorated)     Status: None   Collection Time: 07/25/18 11:53 AM  Result Value Ref Range Status  Specimen Description TRACHEAL ASPIRATE  Final   Special Requests Normal  Final   Gram Stain   Final    MODERATE WBC PRESENT, PREDOMINANTLY PMN RARE SQUAMOUS EPITHELIAL CELLS PRESENT RARE GRAM NEGATIVE RODS    Culture   Final    FEW Consistent with normal respiratory flora. Performed at [facility], [address], [place], [place] [address]    Report Status 07/27/2018 FINAL  Final  Culture, blood (Routine X 2) w Reflex to ID Panel     Status: None (Preliminary result)   Collection Time: 07/25/18 12:28 PM  Result Value Ref Range Status   Specimen Description BLOOD BLOOD RIGHT HAND  Final   Special Requests AEROBIC BOTTLE ONLY Blood Culture adequate volume  Final   Culture   Final    NO GROWTH 4 DAYS Performed at [facility], [address] [place], [place] [address]    Report Status PENDING  Incomplete  Culture, blood (Routine X 2) w Reflex to ID Panel     Status: None (Preliminary result)   Collection Time: 07/25/18 12:40 PM  Result Value Ref Range Status   Specimen Description BLOOD BLOOD LEFT HAND  Final   Special Requests AEROBIC BOTTLE ONLY Blood  Culture adequate volume  Final   Culture   Final    NO GROWTH 4 DAYS Performed at [facility], [address], [place], [place] [address]    Report Status PENDING  Incomplete  Culture, Urine     Status: Abnormal   Collection Time: 07/25/18  3:44 PM  Result Value Ref Range Status   Specimen Description URINE, RANDOM  Final   Special Requests Normal  Final   Culture (A)  Final    <10,000 COLONIES/mL INSIGNIFICANT GROWTH Performed at [facility], [address] [place], [place] [address]    Report Status 07/26/2018 FINAL  Final    Lab Basic Metabolic Panel: Recent Labs  Lab 07/23/18 0253 07/24/18 0422 07/25/18 0539 07/26/18 0016 07/26/18 0420 07/27/18 0500 07/28/18 0319 08/20/18 0242  NA 142 141  --  145 145 148* 151* 147*  K 3.5 3.5  --  2.9* 3.3* 3.4* 3.0* 3.6  CL 105 103  --  102 104 110 114* 113*  CO2 28 30  --  33* 34* 30 29 27   GLUCOSE 183* 163*  --  202* 192* 236* 229* 168*  BUN 30* 29*  --  36* 35* 39* 39* 38*  CREATININE 0.69 0.70  --  0.79 0.67 0.83 0.61 0.52  CALCIUM 9.4 9.0  --  9.5 9.6 10.1 10.5* 10.6*  MG 1.7 2.1 2.3  --  2.1 2.6*  --   --   PHOS 3.5  --   --   --  2.2* 2.4* 2.3* 2.8   Liver Function Tests: Recent Labs  Lab 07/23/18 0253 07/24/18 0422 07/25/18 0539 07/28/18 0319  AST 139* 83* 52* 124*  ALT 649* 430* 264* 203*  ALKPHOS 166* 204* 249* 326*  BILITOT 0.9 0.6 0.8 0.4  PROT 5.2* 5.1* 5.9* 5.8*  ALBUMIN 1.6* 1.7* 1.9* 2.0*   No results for input(s): LIPASE, AMYLASE in the last 168 hours. No results for input(s): AMMONIA in the last 168 hours. CBC: Recent Labs  Lab 07/25/18 1540 07/26/18 0420 07/27/18 0500 07/28/18 0319 2018/08/20 0242  WBC 9.8 13.3* 11.7* 10.4 10.6*  NEUTROABS 8.6* 10.9* 9.2* 8.3* 8.0*  HGB 8.6* 9.7* 9.5* 9.2* 9.4*  HCT 29.9* 32.1* 32.5* 31.5*  32.7*  MCV 87.2 87.0 89.3 91.3 91.3  PLT 284 356 445* 406* 383   Cardiac Enzymes: Recent Labs  Lab 07/22/18 2203 07/23/18 0253 07/23/18 0830   TROPONINI 0.06* 0.10* 0.10*   Sepsis Labs: Recent Labs  Lab 07/25/18 1228  07/26/18 0016 07/26/18 0420 07/27/18 0500 07/28/18 0319 07/30/2018 0242  PROCALCITON 1.06  --   --  0.99 0.79  --   --   WBC  --    < >  --  13.3* 11.7* 10.4 10.6*  LATICACIDVEN 1.3  --  1.1  --   --   --   --    < > = values in this interval not displayed.    Procedures/Operations   Chest tube tracheostomy   Max Fickle July 30, 2018, 3:37 PM

## 2018-08-08 NOTE — Progress Notes (Signed)
Chaplain came in response to code.  Patient did not survive. Family was in shock as she was doing well this morning.  Prayed with family during code and again after they had time to be with her to celebrate her life.  Family pastor had visited earlier in the day. Got funeral home info and next of kin contact info for the nurse and gave it to Financial controller.  Conard Novak, Chaplain   08/06/2018 1400  Clinical Encounter Type  Visited With Patient and family together  Visit Type Code;Death  Referral From Nurse  Consult/Referral To Chaplain  Spiritual Encounters  Spiritual Needs Prayer;Emotional;Grief support  Stress Factors  Family Stress Factors Health changes;Loss

## 2018-08-08 NOTE — Progress Notes (Signed)
This RN and tech in the patients room transferring patient from the chair to the bed when patient's O2 saturation began to drop into the 70s. We got her into the bed and began to bag mask the patient with 100% oxygen until the RT arrrived. RT arrived with my charge RN and patient's heart rate and rhythm looked different from earlier. Rate was 130's and appeared to be A-fib. In the process of getting a 12 lead EKG, the patient went unresponsive and pulseless. Dr. Lake Bells was notified and a code was called.

## 2018-08-08 NOTE — Code Documentation (Signed)
Resident code team responded to page. CCM at bedside running code.   Bufford Lope, DO PGY-3, [facility] 2018-08-19 1:35 PM

## 2018-08-08 NOTE — Progress Notes (Signed)
  Speech Language Pathology Treatment: Kristin Moon Speaking valve  Patient Details Name: Kristin Moon MRN: [id number] DOB: [birth year] Today's Date: 2018-08-28 Time: 1200-1210 SLP Time Calculation (min) (ACUTE ONLY): 10 min  Assessment / Plan / Recommendation Clinical Impression  Pt demonstrates possible minimal phonation with max efforts to redirect air. Deficits appear to be a combination of limited passage of air to upper airway with 6 cuffed trach, but also question if pt is truly attempting phonation. With a cough pt did have minimal phonation and husband reports hearing her voice very briefly early without pmsv in place. Pts breath support also very poor today, she appears fatigued. Will continue efforts.   HPI HPI: 73 yr old female w/ PMHx of NHL, HLD, HTN, Hypothyroidism, LBBB, CAD G1DD presented from home via EMS after coughing up blood and feeling weak, Tested flu positive at her PMD visit on 07/14/2018 and started on Tamiflu as outpatient but continued to worsen. Failed BiPAP treatment in ED, intubated for acute respiratory failure on 2/7-2/14 trach placed.       SLP Plan  Continue with current plan of care       Recommendations         Patient may use Passy-Muir Speech Valve: with SLP only PMSV Supervision: Full MD: Please consider changing trach tube to : Smaller size;Cuffless         Follow up Recommendations: Inpatient Rehab SLP Visit Diagnosis: Dysphagia, oropharyngeal phase (R13.12) Plan: Continue with current plan of care       GO               [name], MA [place] Pager [phone] Office [phone]  Lynann Beaver 28-Aug-2018, 2:19 PM

## 2018-08-08 NOTE — Progress Notes (Signed)
Responded to code blue with Dr. Lake Bells.  CPR + BVM in progress on arrival to room.  Staff reported patient was at baseline & then staff recognized a rhythm change thought to be AFwRVR.  During EKG process, she became bradycardic to PEA. No IV access on arrival (PIV not working).  Emergently placed left femoral central venous catheter during CPR efforts.  Blood return, flushed easily, sutured in place.  ACLS efforts continued until 1349.  Family at bedside, updated per Dr. Lake Bells during process.  Rescusitation efforts stopped.     Noe Gens, NP-C [facility] Pgr: [phone] or if no answer [phone] 2018/08/19, 2:00 PM

## 2018-08-08 NOTE — Progress Notes (Signed)
Pt placed on ACT 35%.  Pt tolerating well.

## 2018-08-08 NOTE — Progress Notes (Signed)
RT called to bedside due to pt desat. Upon arrival, RN was bagging patient. They had been getting the patient back to bed and cleaning patient up due to BM. Cuff to trach was down. RT placed pt back on vent and inflated cuff. RT noticed distended belly so RT listened to breath sounds. Bilateral Breath sounds were present. Coarse crackles on right and clear on left with diminished bases bilaterally. RT attempted to suction with nothing returned. RN then started to get EKG due to questionable afib. While hooking up EKG, pt brady and became less responsive. RT started manually ventilating via AMBU and Code blue was called. End tidal was placed on pt to confirm trach placement and color change was positive.

## 2018-08-08 DEATH — deceased

## 2018-09-01 ENCOUNTER — Ambulatory Visit: Payer: Medicare Other | Admitting: Cardiovascular Disease

## 2018-09-10 LAB — ACID FAST SMEAR (AFB, MYCOBACTERIA)

## 2018-09-10 LAB — FUNGUS CULTURE WITH STAIN

## 2018-09-10 LAB — ACID FAST SMEAR (AFB): ACID FAST SMEAR - AFSCU2: NEGATIVE

## 2018-09-10 LAB — ACID FAST CULTURE WITH REFLEXED SENSITIVITIES (MYCOBACTERIA): Acid Fast Culture: NEGATIVE

## 2018-09-10 LAB — FUNGAL ORGANISM REFLEX

## 2018-09-10 LAB — FUNGUS CULTURE RESULT

## 2020-03-18 IMAGING — DX DG CHEST 2V
3 series · 3 of 3 positions shown · non-contrast
Comparison: 10/17/2010

CLINICAL DATA: Cough and fever

EXAM:
CHEST - 2 VIEW

[chest pa]
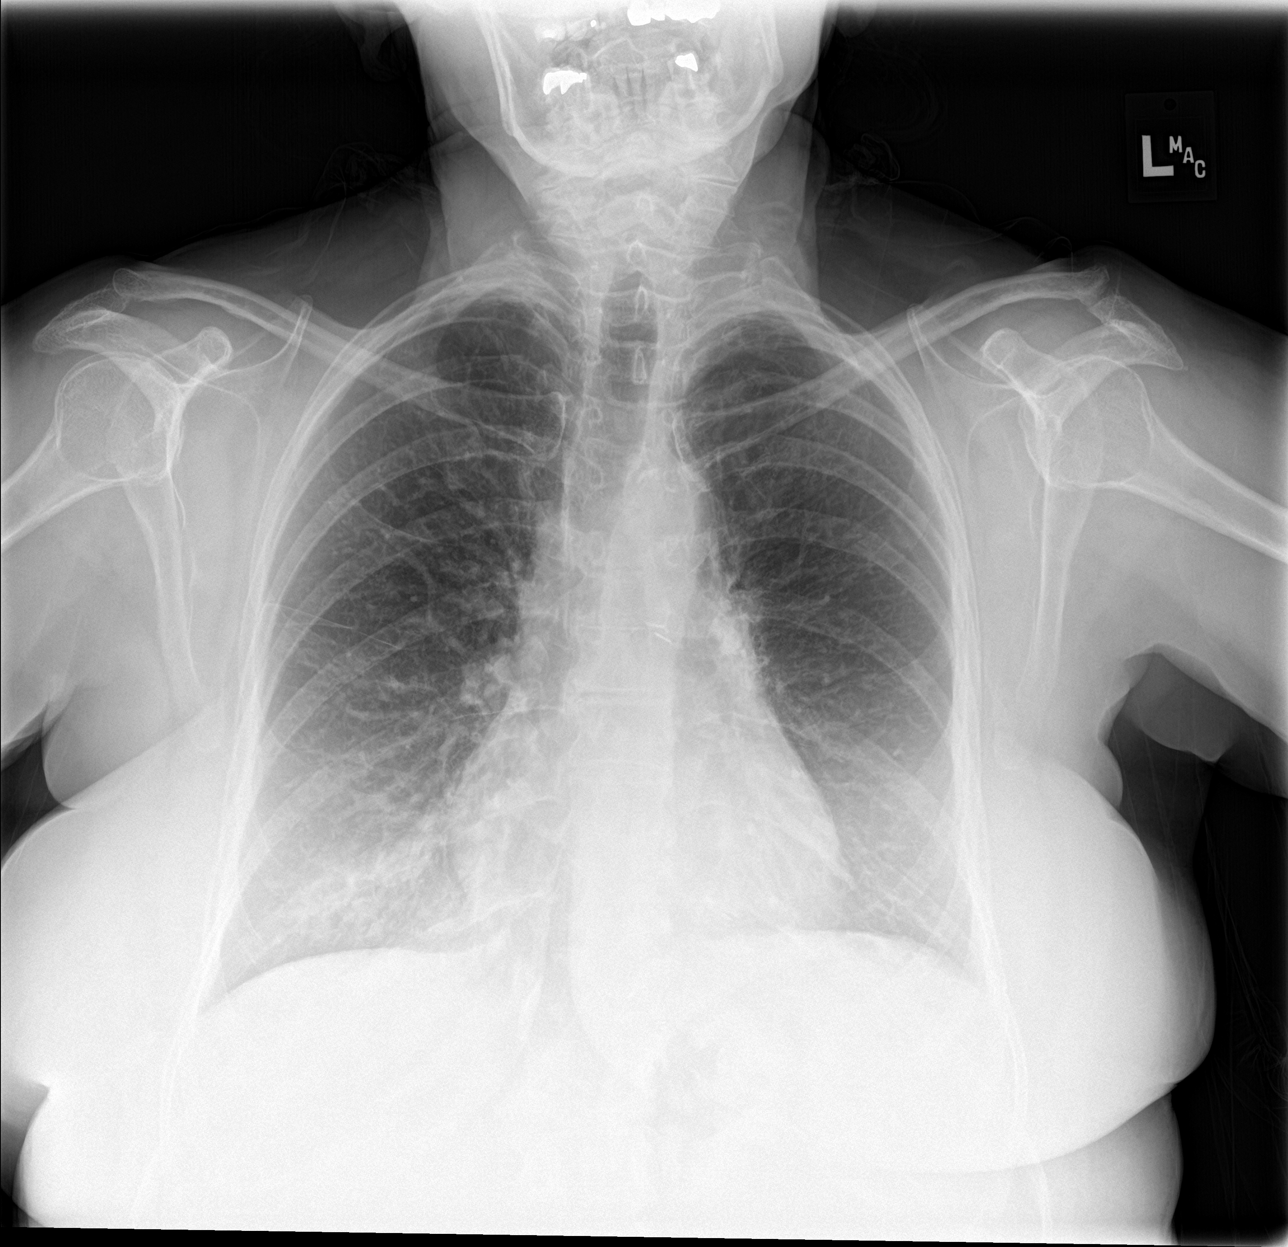

[chest lat (1 of 2)]
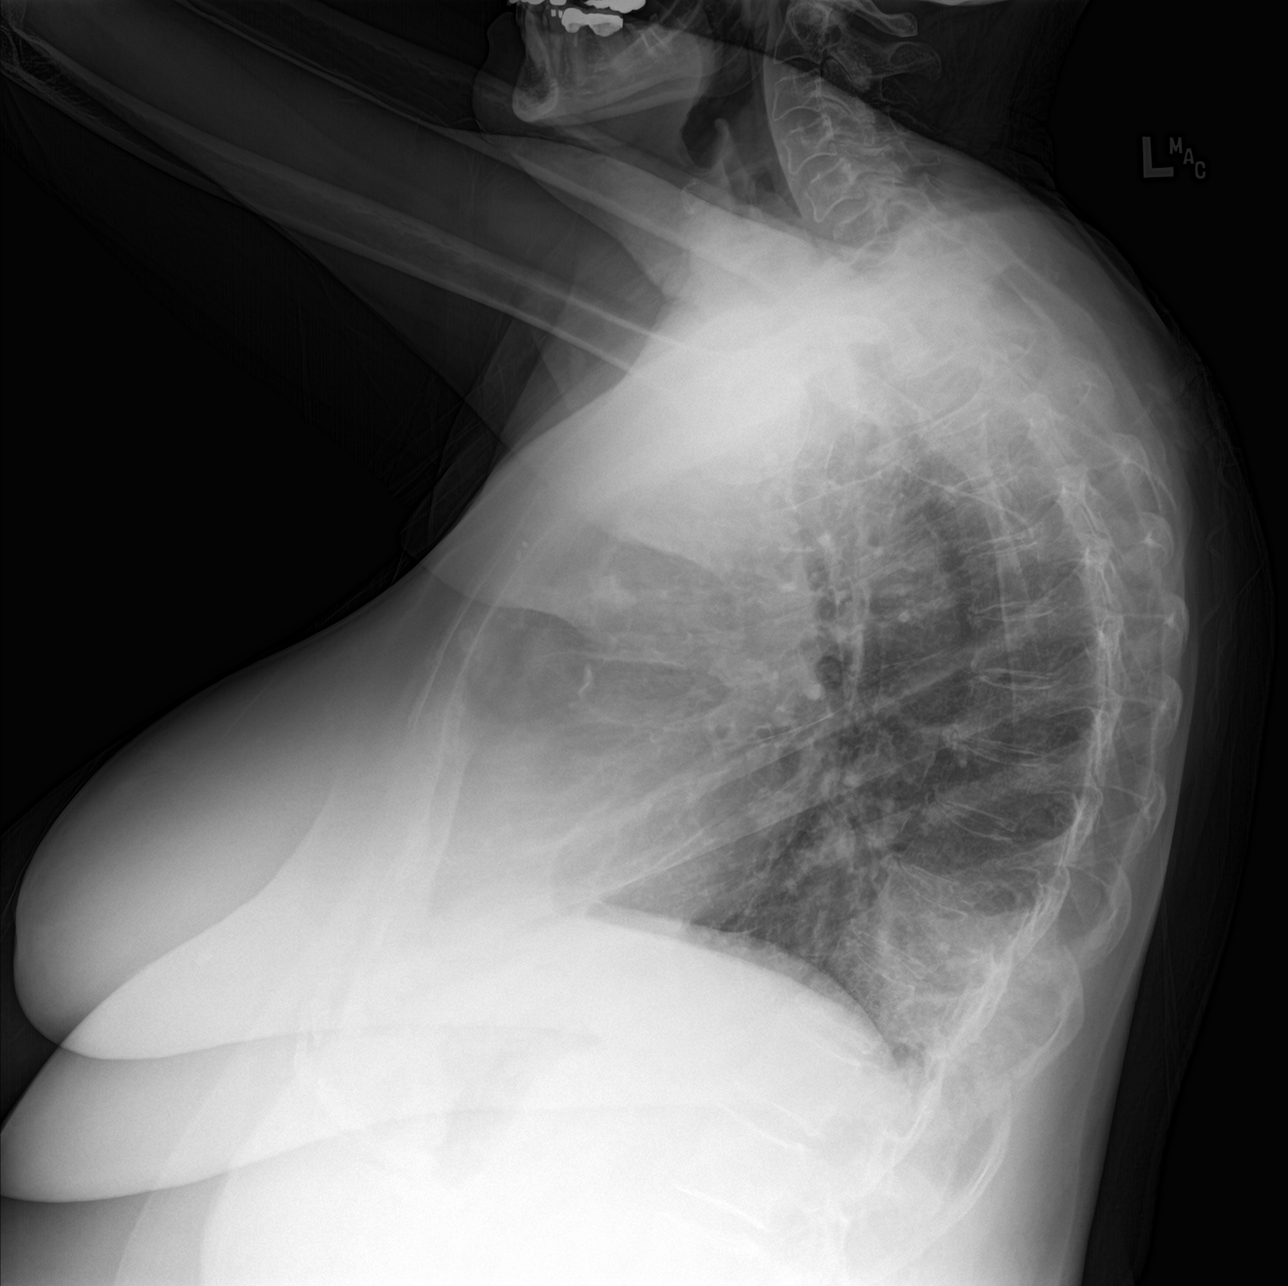

[chest lat (2 of 2)]
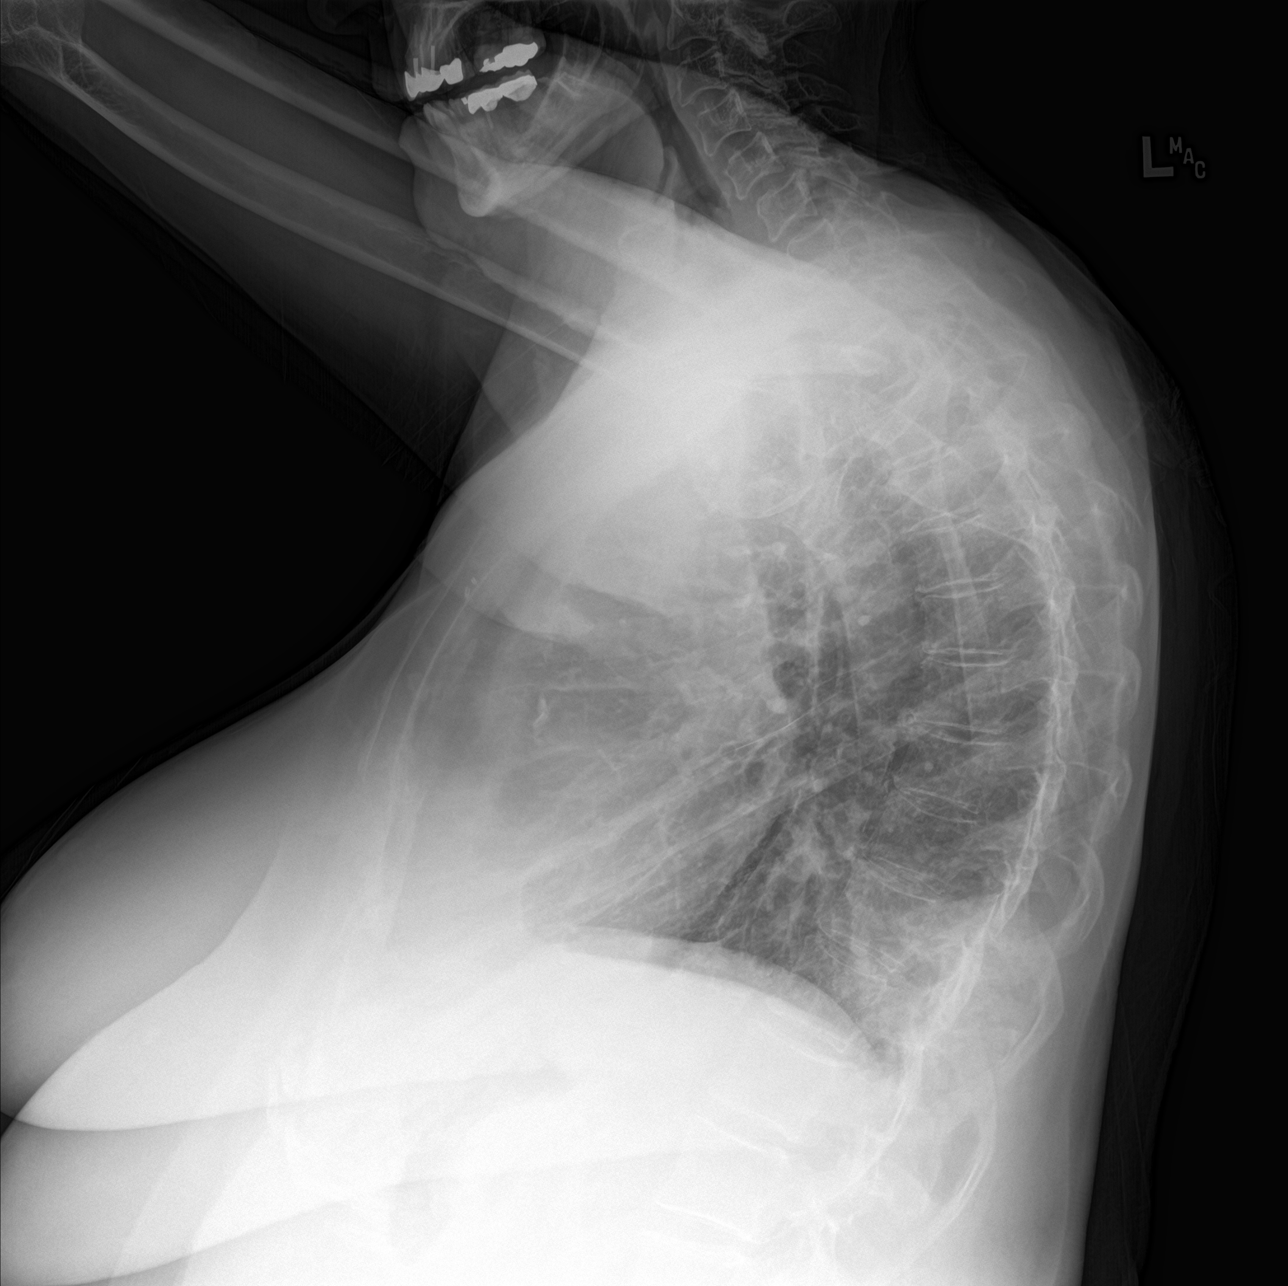

[3 of 3 positions shown; findings below may reference images not displayed]

FINDINGS: Posterior lung infiltrate, probably right lower lobe. Diffuse
interstitial thickening. No pleural effusion. Normal heart size. No
pneumothorax.
IMPRESSION: Posterior lung opacity, may reflect pneumonia. Imaging follow-up to
resolution recommended.

## 2020-09-15 IMAGING — MR MR LUMBAR SPINE W/O CM
4 of 5 series · 18 of 48 positions shown · non-contrast
Comparison: 08/03/2008

CLINICAL DATA: Low back pain radiating into the right hip and down
the right leg for 6 months. Remote history of lymphoma.

EXAM:
MRI LUMBAR SPINE WITHOUT CONTRAST
TECHNIQUE: Multiplanar, multisequence MR imaging of the lumbar spine was
performed. No intravenous contrast was administered.

[Series 5: T2 · sagittal · 4.0mm · 0.73mm/px · 6 of 15 slices shown (1 of 2)]
[im 1/15]
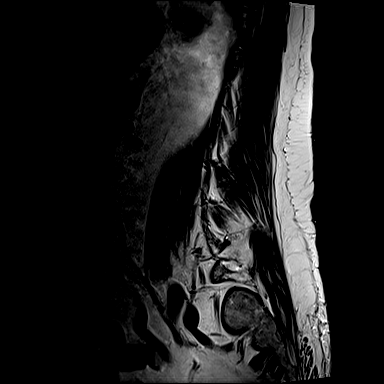
[im 3/15]
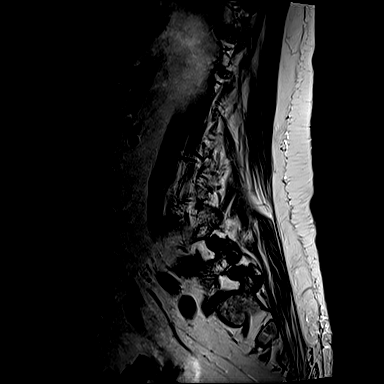
[im 6/15]
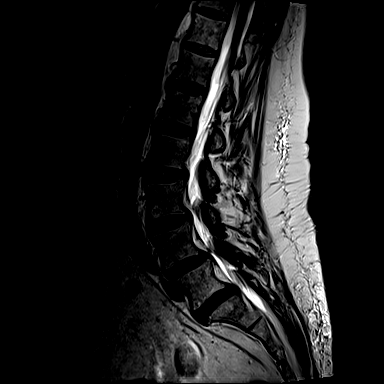
[im 9/15]
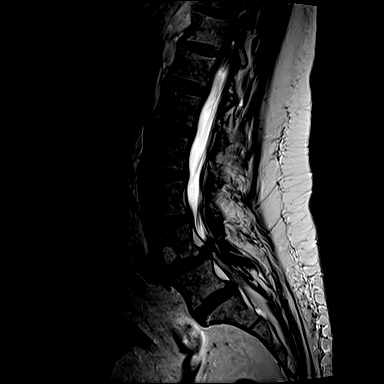
[im 12/15]
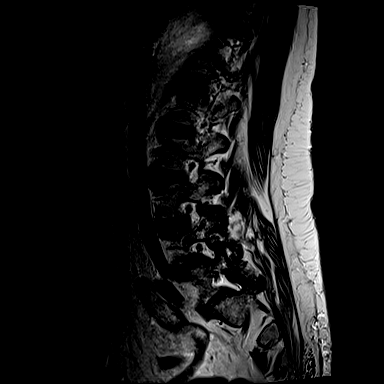
[im 15/15]
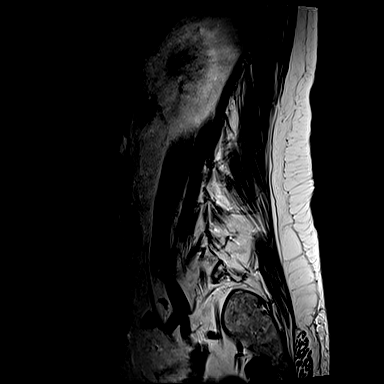

[Series 7: T1 · sagittal · 4.0mm · 0.73mm/px · 3 of 15 slices shown (1 of 2)]
[im 3/15]
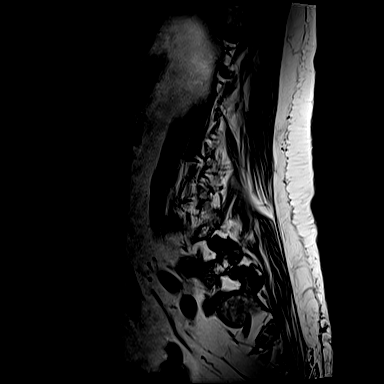
[im 9/15]
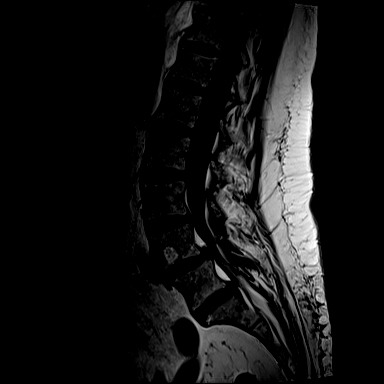
[im 15/15]
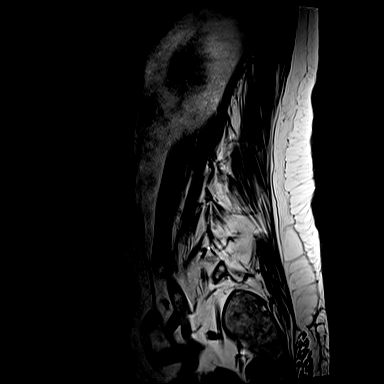

[Series 10: T1 · axial · 4.0mm · 0.28mm/px · z∈[-7,+163]mm · 3 of 40 slices shown (2 of 2)]
[im 6/40]
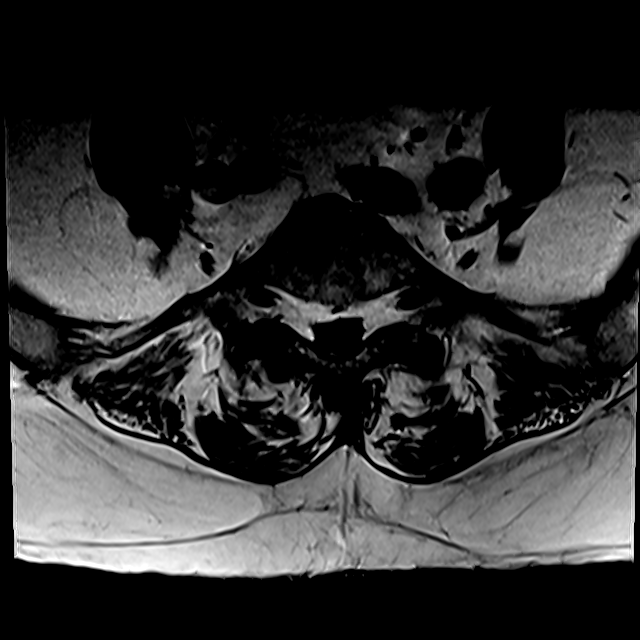
[im 20/40]
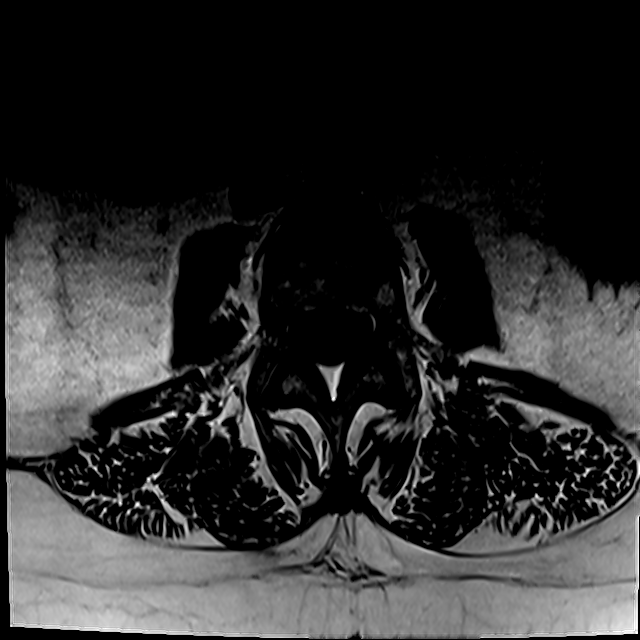
[im 34/40]
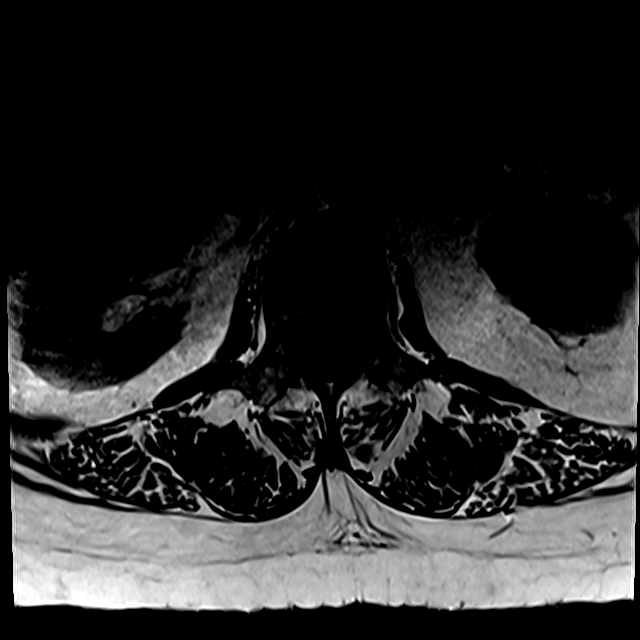

[Series 13: T2 · axial · 4.0mm · 0.28mm/px · z∈[-30,+163]mm · 6 of 40 slices shown (2 of 2)]
[im 1/40]
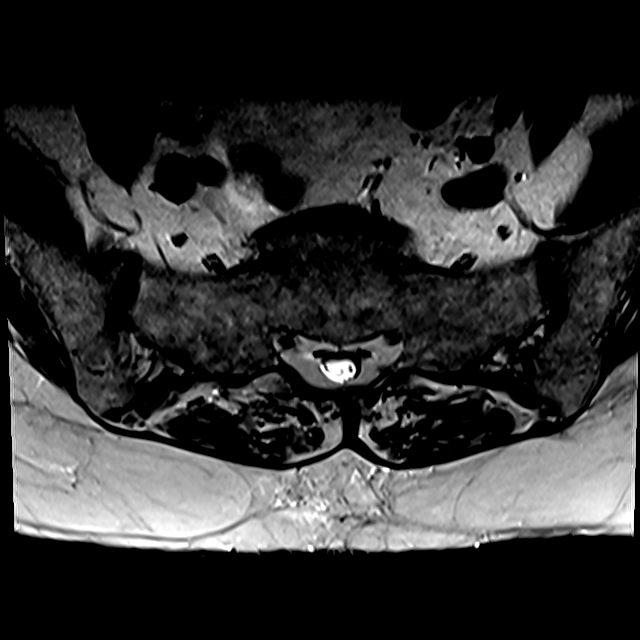
[im 6/40]
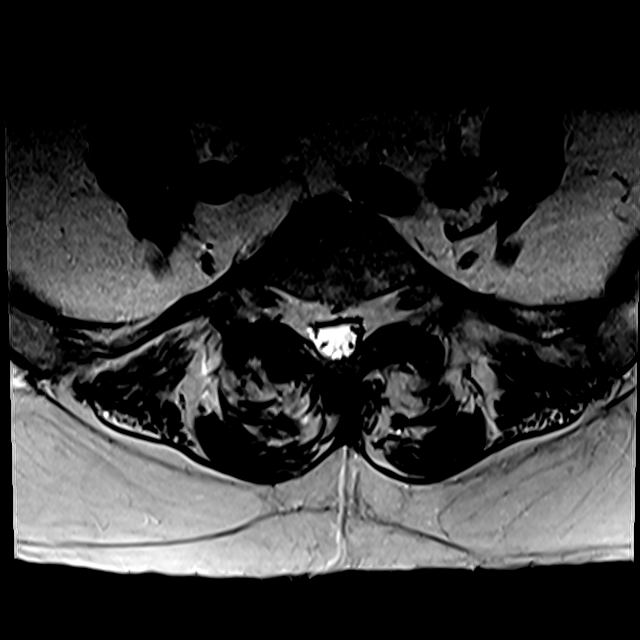
[im 12/40]
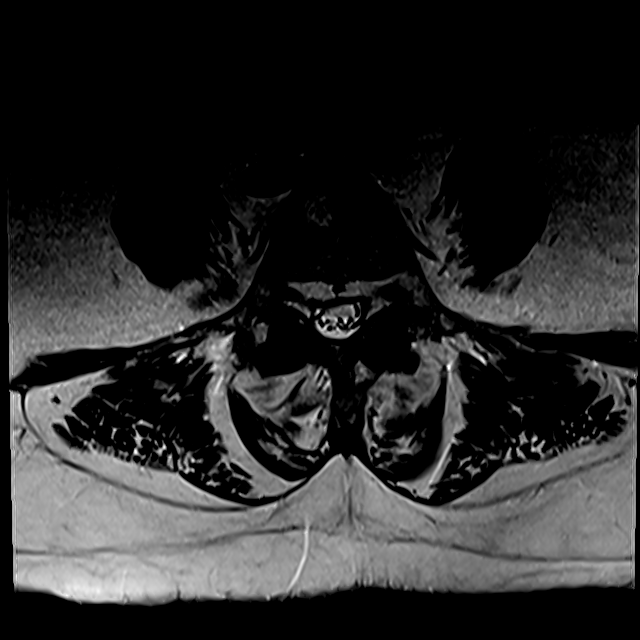
[im 17/40]
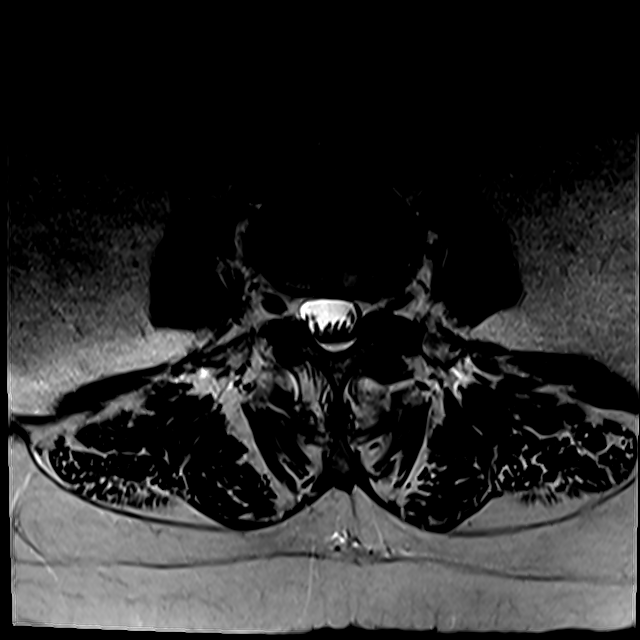
[im 20/40]
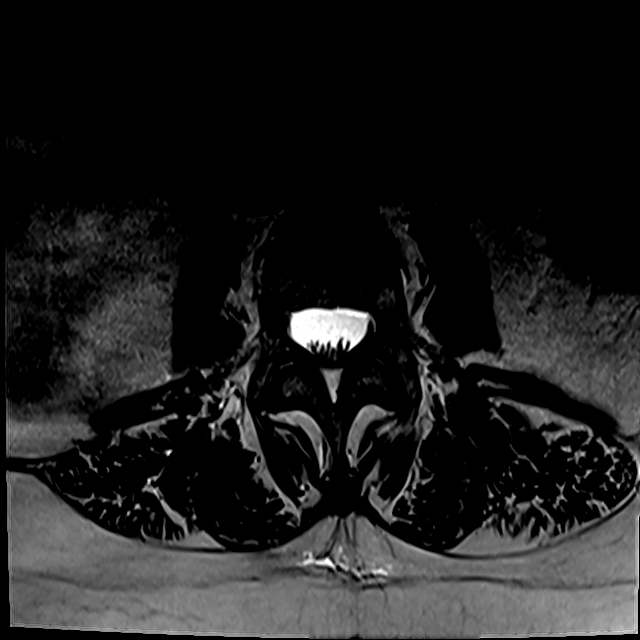
[im 34/40]
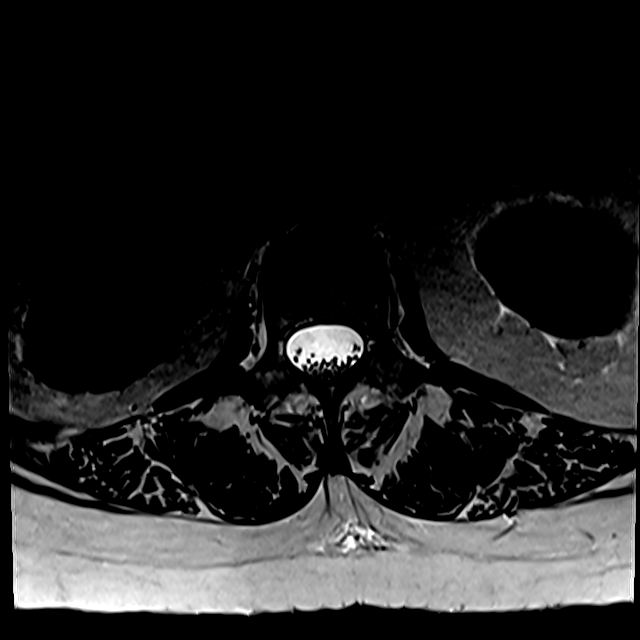

[18 of 48 positions shown; findings below may reference images not displayed]

FINDINGS: Segmentation: 5 non rib-bearing lumbar type vertebrae. Rudimentary
ribs at T12.

Alignment: New facet mediated anterolisthesis measuring 5 mm at L4-5
and L5-S1.

Vertebrae: No fracture. Mildly heterogeneous bone marrow signal
diffusely without suspicious focal lesion. Scattered small vertebral
hemangiomas.

Conus medullaris and cauda equina: Conus extends to the L1 level.
Conus and cauda equina appear normal.

Paraspinal and other soft tissues: Unremarkable.

Disc levels:

Disc desiccation greatest at L4-5. Progressive, mild disc space
narrowing at L4-5 and new mild disc space narrowing at L5-S1.

T12-L1: Negative.

L1-2: Slight facet hypertrophy without disc herniation or stenosis.

L2-3: Mild facet hypertrophy and new minimal disc bulging without
stenosis.

L3-4: Mild disc bulging and mild-to-moderate facet hypertrophy,
minimally progressed. No stenosis.

L4-5: New anterolisthesis with disc uncovering, broad central pseudo
disc protrusion, and severe facet and mild ligamentum flavum
hypertrophy result in new moderate spinal stenosis and mild
bilateral lateral recess stenosis. There are right larger than left
facet joint effusions which can be seen in the setting of
instability.

L5-S1: Anterolisthesis with disc bulging and severe facet
hypertrophy result in new mild bilateral lateral recess stenosis
without significant spinal or neural foraminal stenosis. Small facet
joint effusions.
IMPRESSION: 1. Progressive, severe facet arthrosis at L4-5 and L5-S1 with grade
1 anterolisthesis at both levels.
2. New moderate spinal stenosis at L4-5.
3. Mild lateral recess stenosis at L5-S1.
4. Slight progression of disc and facet degeneration at L3-4 without
stenosis.

## 2020-10-23 IMAGING — DX DG CHEST 1V PORT
1 series · 1 of 1 positions shown · non-contrast
Comparison: 02/16/2018 and earlier.

CLINICAL DATA: 72-year-old female with suspected sepsis.
Hemoptysis. Diagnosed with flu yesterday.

EXAM:
PORTABLE CHEST 1 VIEW

[chest ap]
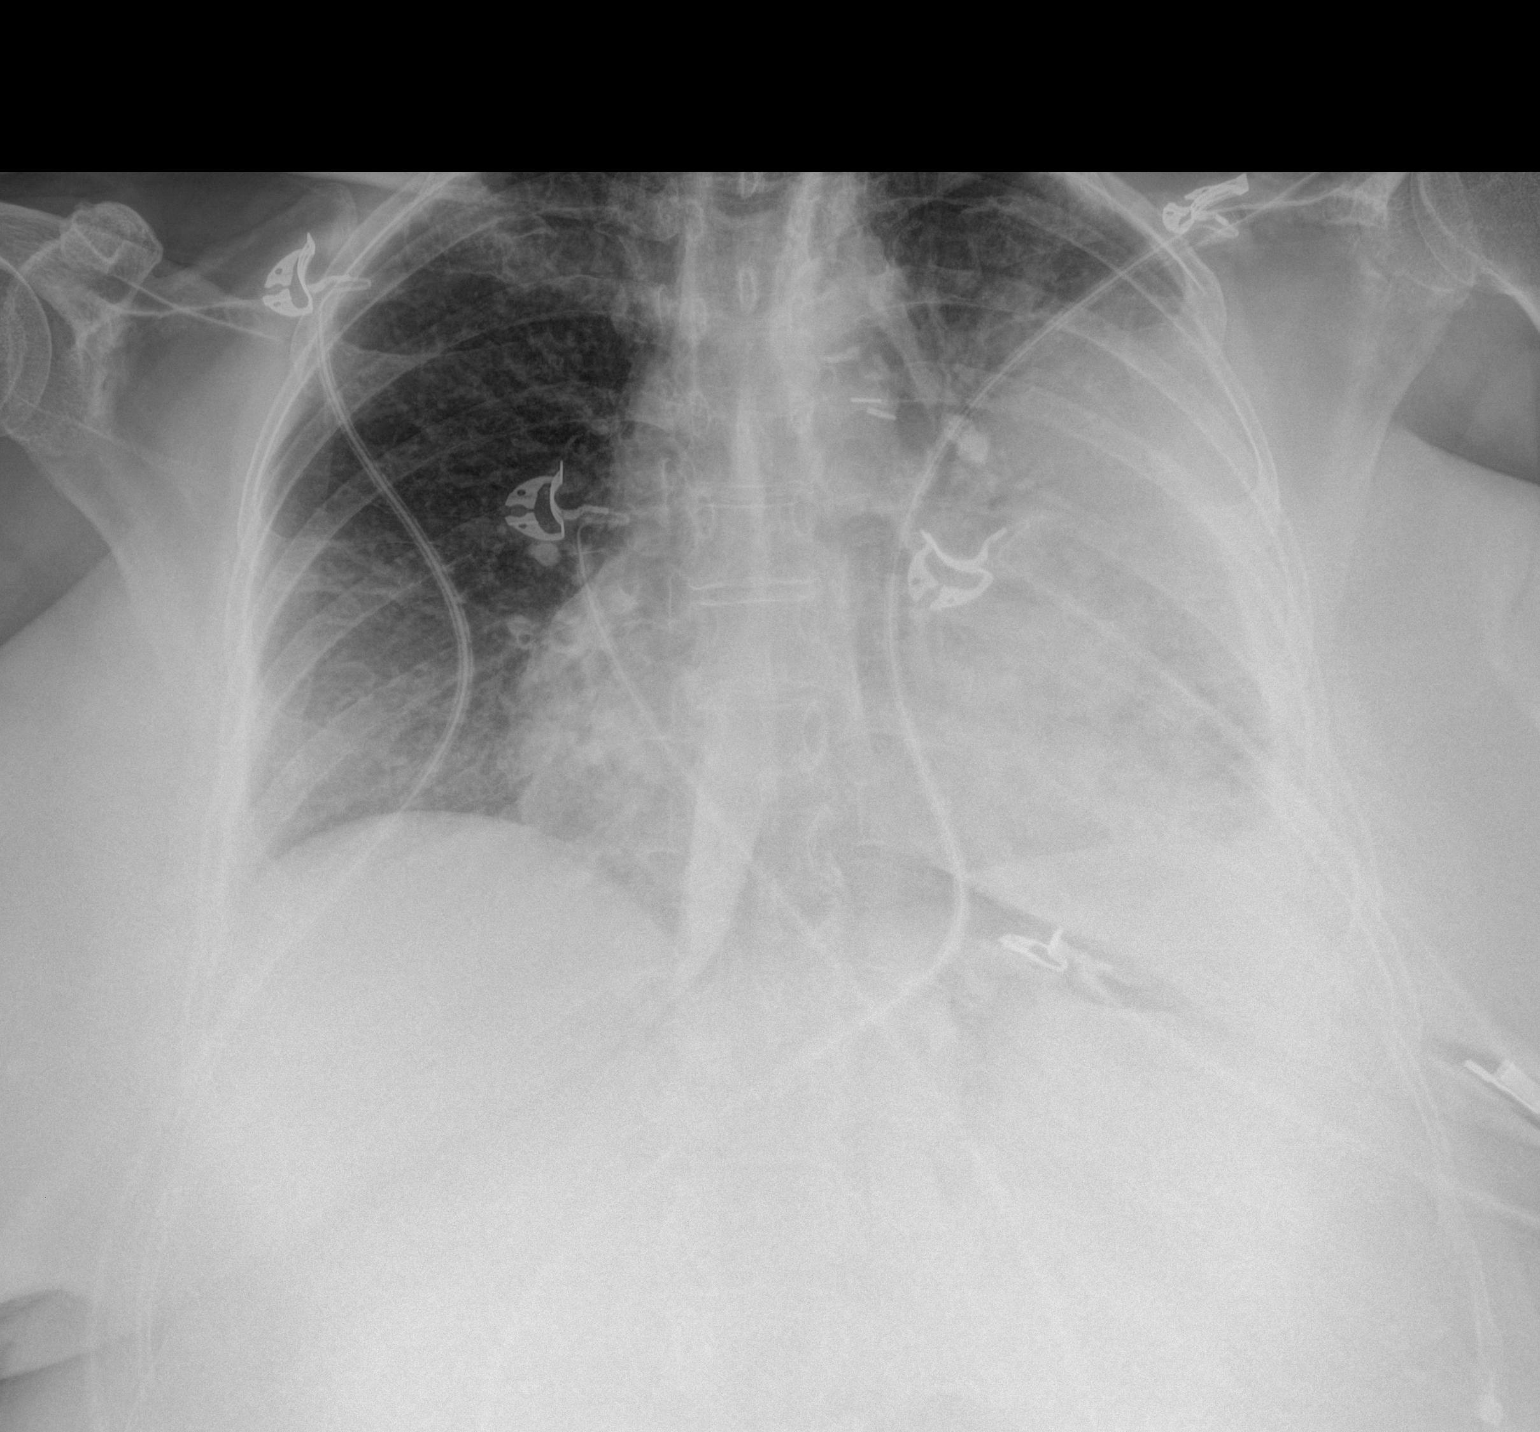

[1 of 1 positions shown; findings below may reference images not displayed]

FINDINGS: Portable AP semi upright view at 6266 hours. Large area of
consolidation throughout the left lung, only sparing the left lung
apex. Air is visible in the left mainstem bronchus and its
bifurcation. Visible mediastinal contours appear stable. Visualized
tracheal air column is within normal limits. The right lung appears
clear allowing for portable technique. No pneumothorax. No pleural
effusion is evident. Paucity bowel gas in the upper abdomen.
IMPRESSION: 1. Multilobar left lung Pneumonia.  No pleural effusion is evident.
2. Right lung appears clear.

## 2020-10-24 IMAGING — DX DG CHEST 1V PORT
1 series · 1 of 1 positions shown · non-contrast
Comparison: 07/16/2018

CLINICAL DATA: Intubation post bronchoscopy

EXAM:
PORTABLE CHEST 1 VIEW

[chest ap]
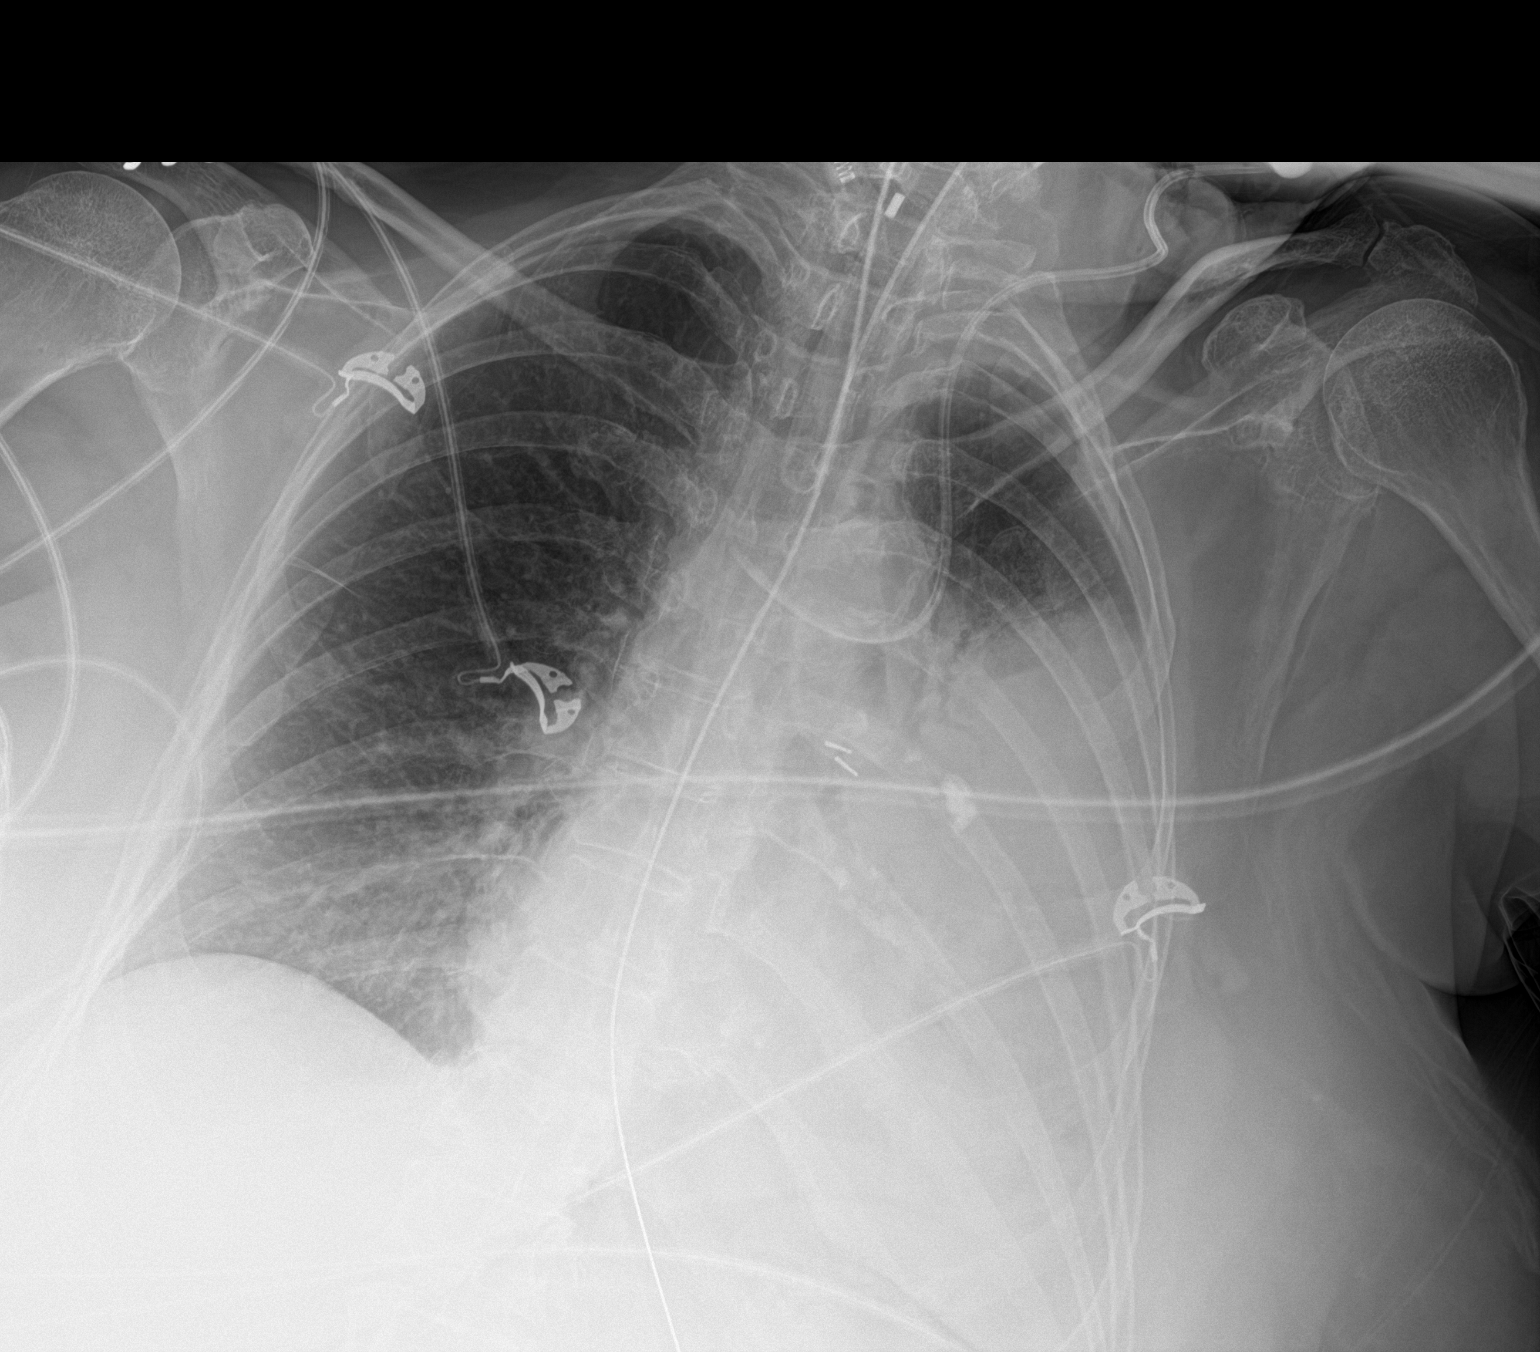

[1 of 1 positions shown; findings below may reference images not displayed]

FINDINGS: Endotracheal tube in good position. Left jugular central venous
catheter in the SVC. NG tube enters the stomach.

No pneumothorax. Surgical clips left hilum. Left lower lobe dense
consolidation has progressed. Right lung remains clear. No edema.
IMPRESSION: Endotracheal tube in good position

Dense consolidation left lower lobe has progressed in the interval.
No pneumothorax.

## 2020-10-24 IMAGING — DX DG CHEST 1V PORT
1 series · 1 of 1 positions shown · non-contrast
Comparison: Portable exam 7788 hours compared to 07/16/2018 at 5754
hours

CLINICAL DATA: Dyspnea, thoracentesis

EXAM:
PORTABLE CHEST 1 VIEW

[chest ap]
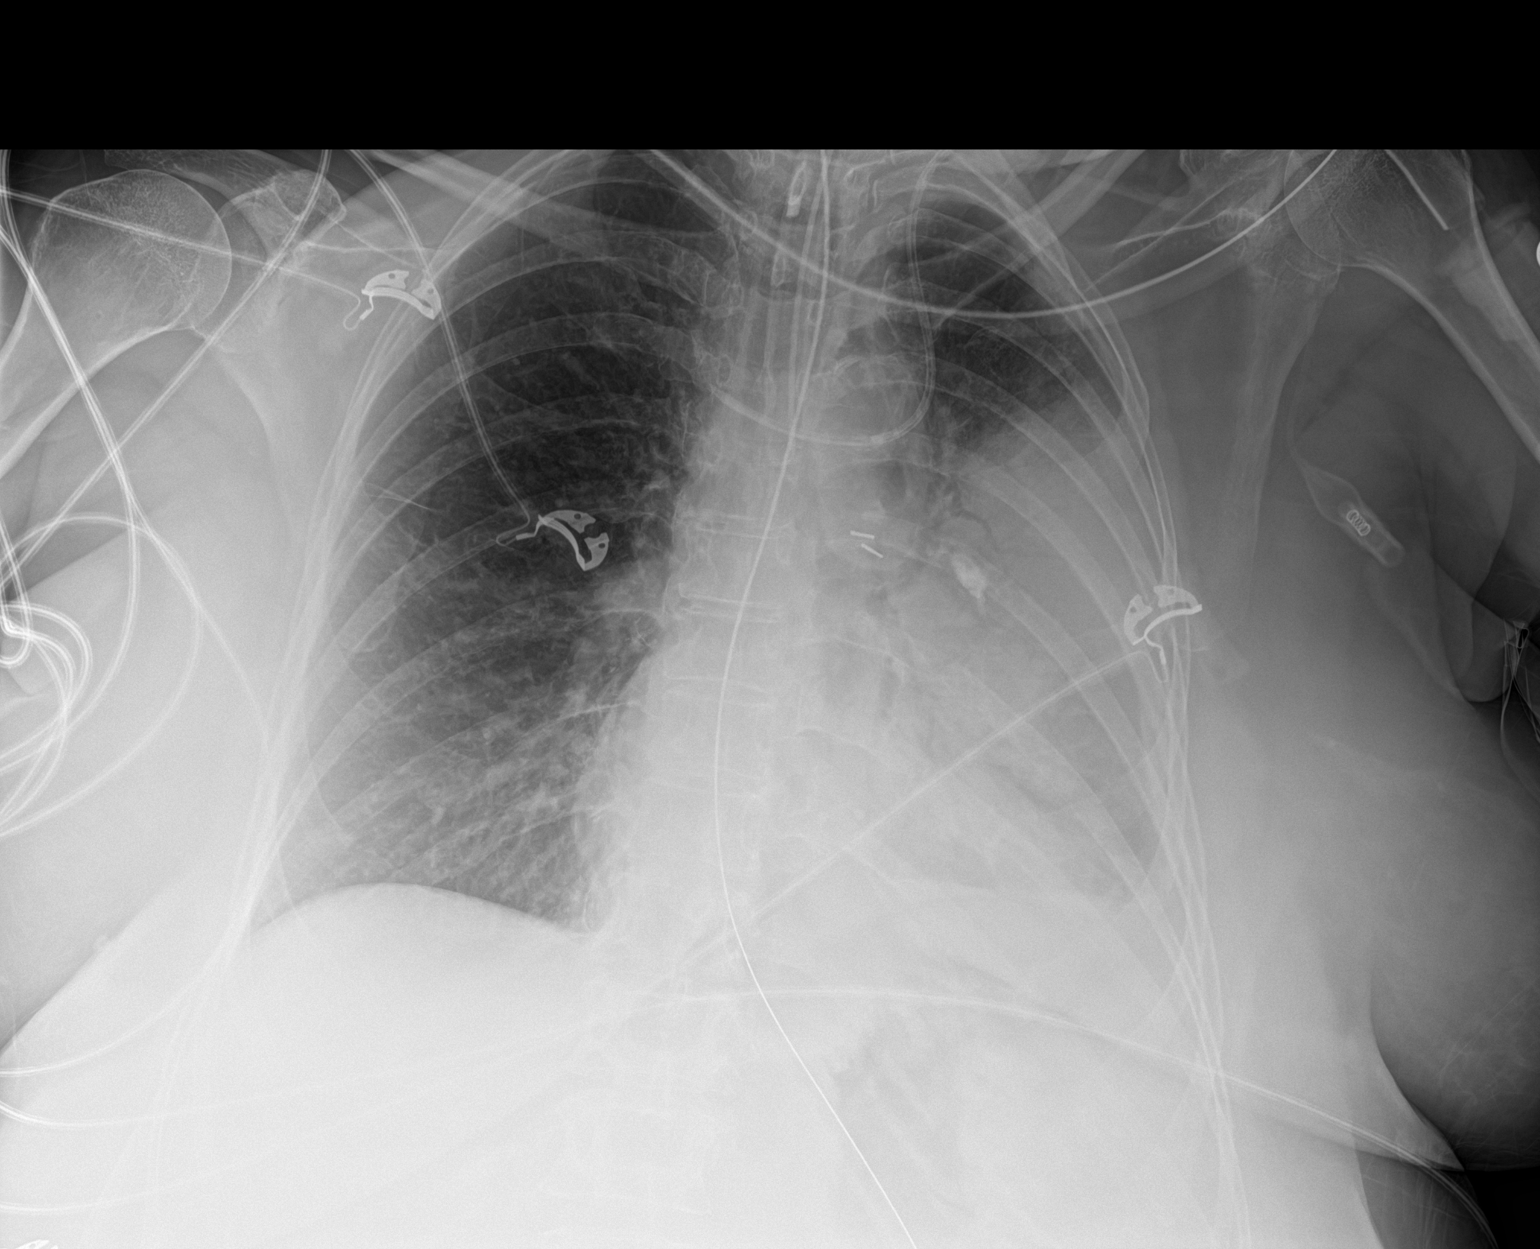

[1 of 1 positions shown; findings below may reference images not displayed]

FINDINGS: Tip of endotracheal tube projects 11 mm above carina.

Nasogastric tube extends into stomach.

LEFT jugular central venous catheter with tip projecting over
confluence of the SVC directed slightly towards the RIGHT subclavian
vein.

EKG leads project over chest.

Calcified adenopathy at LEFT hilum.

Persistent consolidation of LEFT lung with decreased pleural
effusion.

RIGHT lung clear.

No pneumothorax or acute osseous findings.
IMPRESSION: Persistent LEFT lung infiltrates consistent with pneumonia with
decrease in LEFT pleural effusion since prior exam.

No pneumothorax.

## 2020-10-24 IMAGING — DX DG CHEST 1V PORT
1 series · 1 of 1 positions shown · non-contrast
Comparison: 07/15/2018

CLINICAL DATA: Endotracheal and OG tube placements

EXAM:
PORTABLE CHEST 1 VIEW

[chest ap]
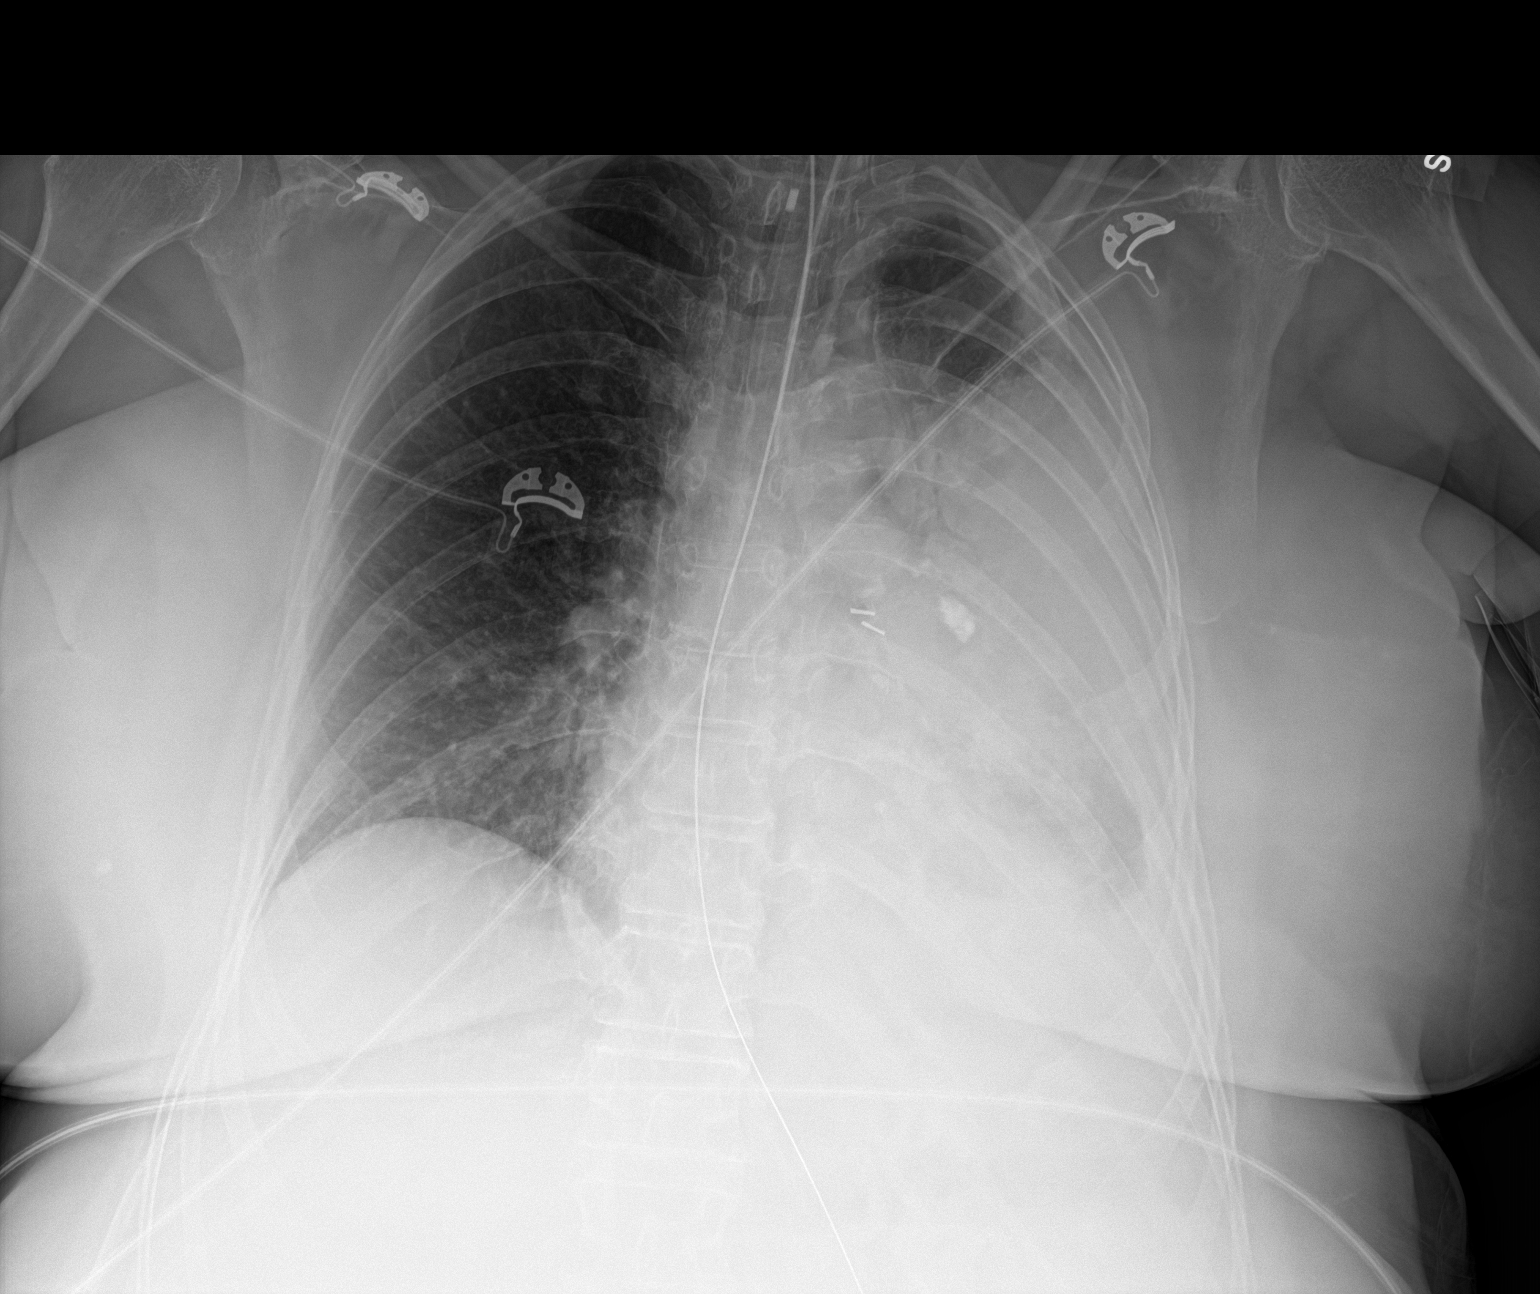

[1 of 1 positions shown; findings below may reference images not displayed]

FINDINGS: Endotracheal tube placed with tip measuring 3.2 cm above the carina.
Enteric tube tip is off the field of view but below the left
hemidiaphragm. Dense consolidation in the left mid and lower lung
with air bronchograms. This is likely pneumonia. Calcifications and
surgical clips in the left hilar region. Right lung is clear and
expanded. Heart size is obscured by the left parenchymal process.
IMPRESSION: Appliances appear in satisfactory location. Dense consolidation in
the left mid and lower lung with air bronchograms likely
representing pneumonia.

## 2020-10-24 IMAGING — DX DG CHEST 1V PORT
1 series · 1 of 1 positions shown · non-contrast
Comparison: Earlier same day

CLINICAL DATA: Central line placement

EXAM:
PORTABLE CHEST 1 VIEW

[chest ap]
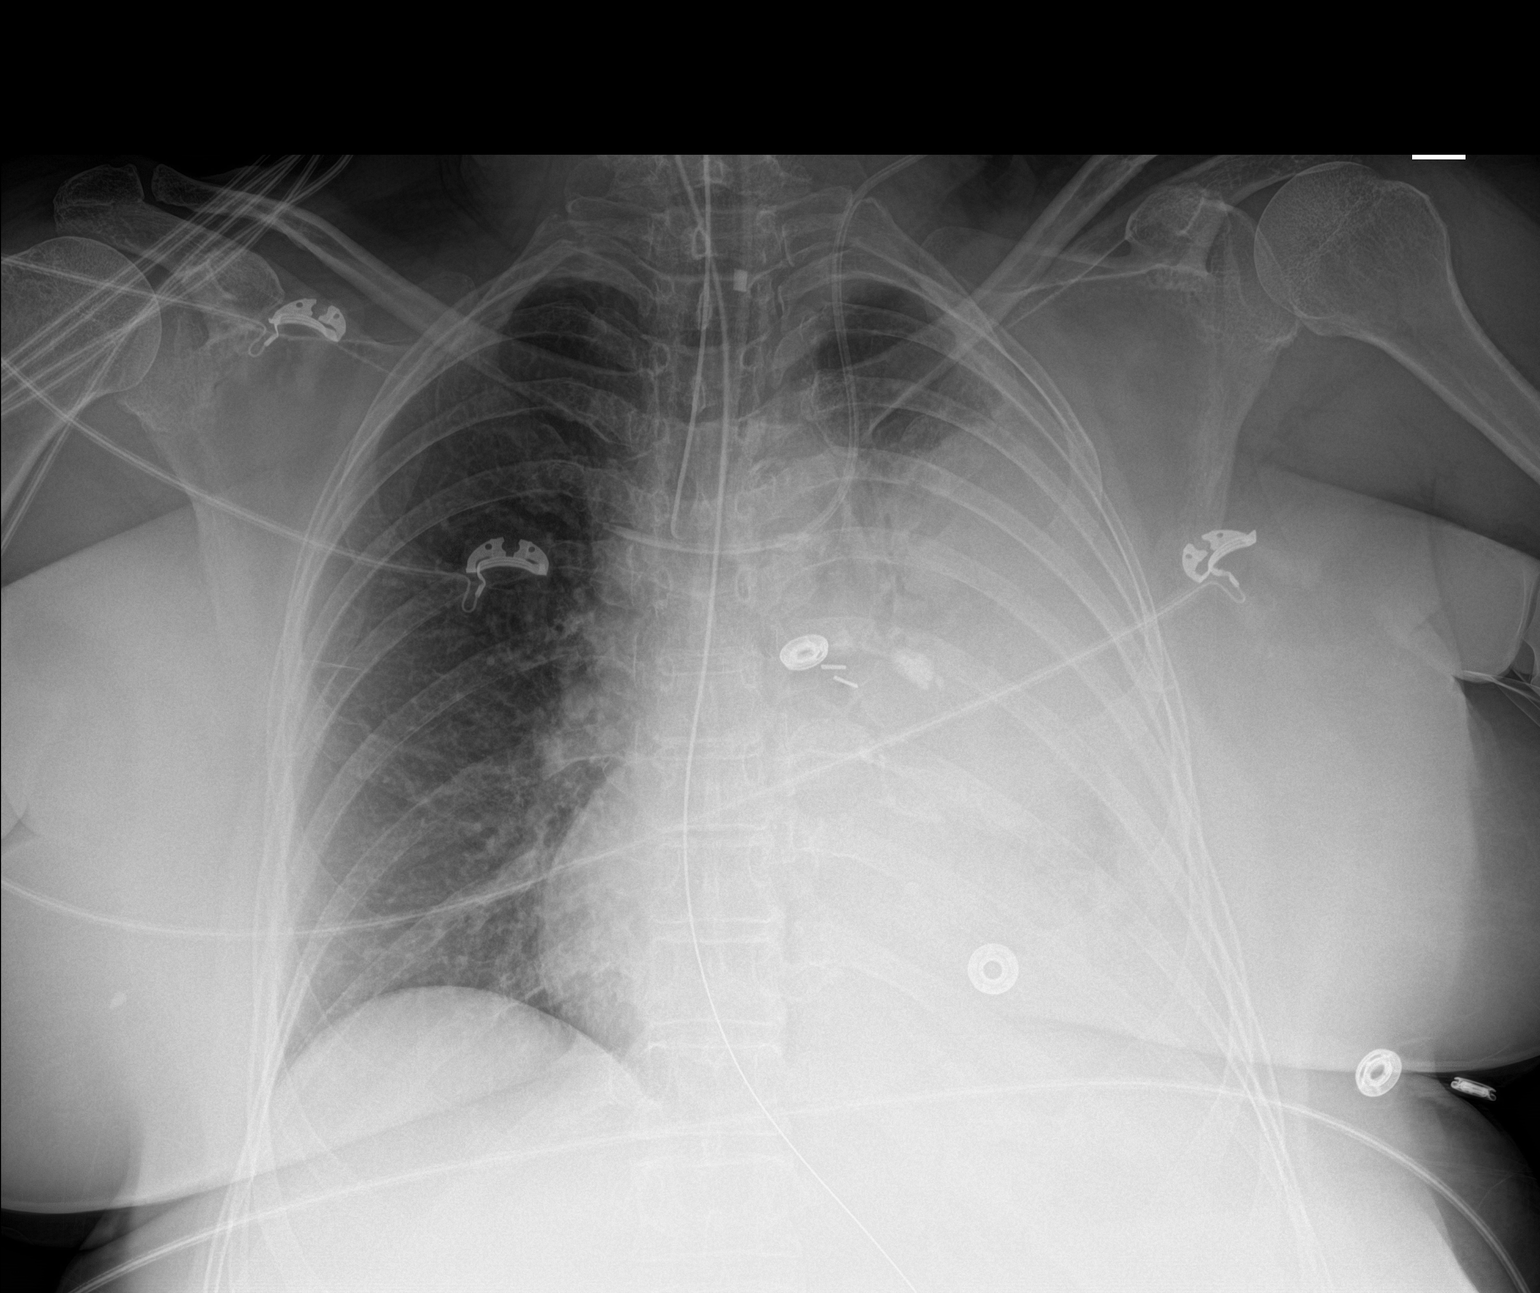

[1 of 1 positions shown; findings below may reference images not displayed]

FINDINGS: Endotracheal tube with the tip 2.1 cm above the carina. Interval
placement of a left jugular central venous catheter with the tip at
the confluence of the brachiocephalic vein-SVC. Nasogastric tube
coursing below the diaphragm. Large left pleural effusion and left
upper and lower lobe airspace disease with only a small area of
aerated lung at the left lung apex. Right lung is clear. No
pneumothorax. Stable cardiomediastinal silhouette.
IMPRESSION: 1. Interval placement of a left jugular central venous catheter with
the tip at the confluence of the brachiocephalic vein-SVC.
2. Large left pleural effusion and left upper and lower lobe
airspace disease with only a small area of aerated lung at the left
lung apex.

## 2020-10-25 IMAGING — DX DG CHEST 1V PORT
1 series · 1 of 1 positions shown · non-contrast
Comparison: 07/16/2018

CLINICAL DATA: Acute respiratory failure

EXAM:
PORTABLE CHEST 1 VIEW

[chest]
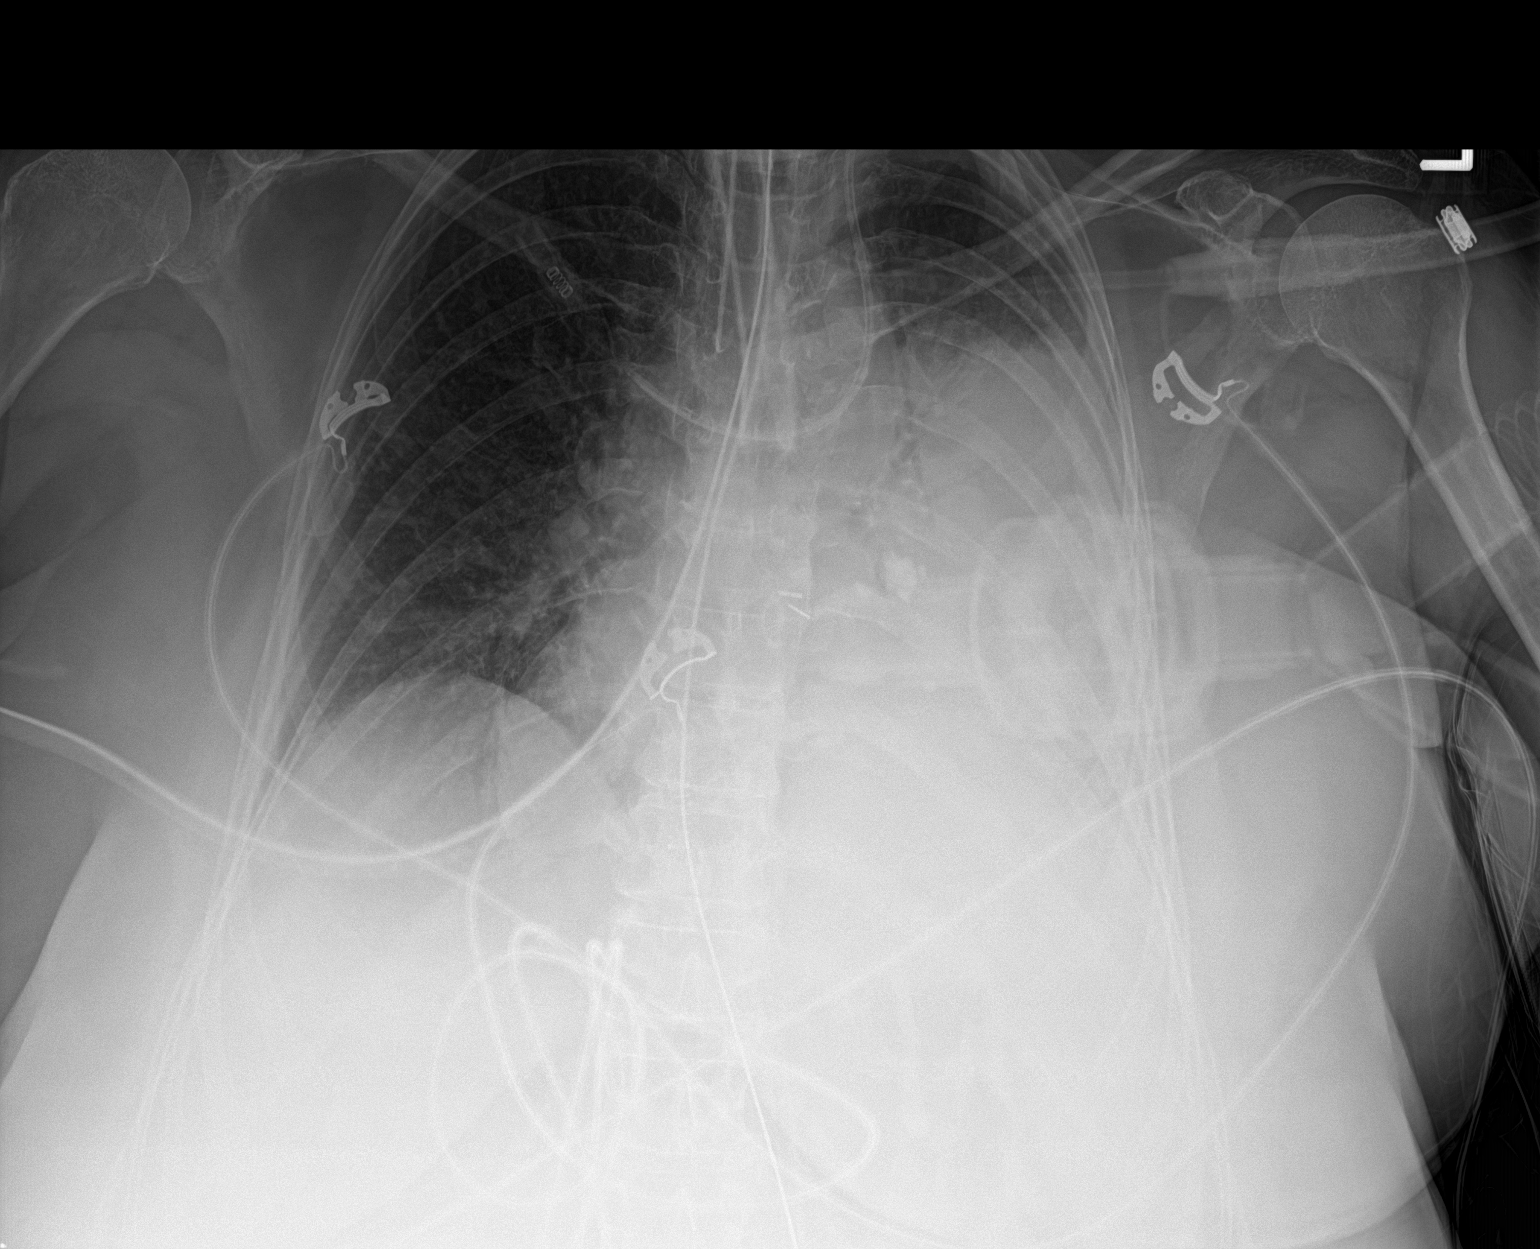

[1 of 1 positions shown; findings below may reference images not displayed]

FINDINGS: Endotracheal tube is in place, tip 2.4 centimeters above the carina.
Nasogastric tube is in place, tip beyond the gastroesophageal
junction off the image. A LEFT IJ central line tip overlies the
confluence of the RIGHT brachiocephalic with the superior vena cava.

There is persistent dense opacity in the LEFT lung, sparing the LEFT
lung apex. Surgical clips are identified in the LEFT hilar region.
No focal consolidations identified within the RIGHT lung.
IMPRESSION: 1. Persistent significant LEFT lung opacity.
2. Stable lines and tubes.

## 2020-10-26 IMAGING — DX DG CHEST 1V PORT
1 series · 1 of 1 positions shown · non-contrast
Comparison: 07/17/2018

CLINICAL DATA: Respiratory failure.

EXAM:
PORTABLE CHEST 1 VIEW

[chest]
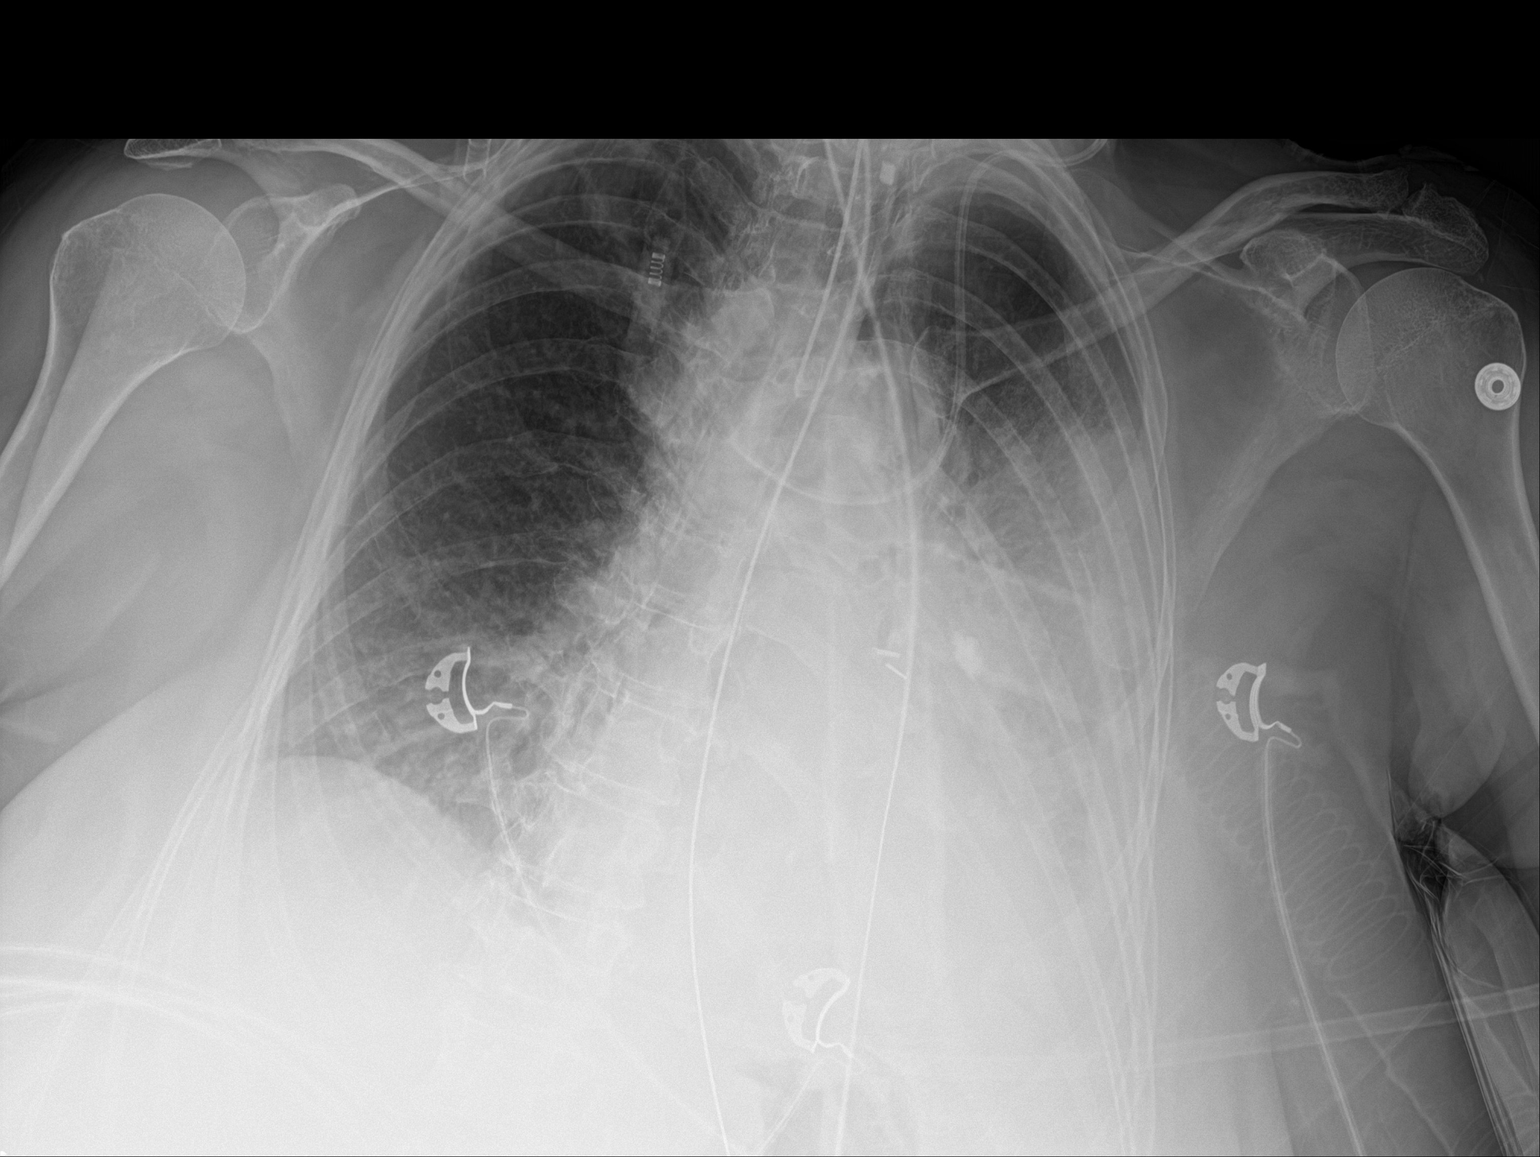

[1 of 1 positions shown; findings below may reference images not displayed]

FINDINGS: The endotracheal tube is 2.7 cm above the carina. The NG tube is
coursing down the esophagus and into the stomach. The left IJ
catheter is stable with the tip at the brachiocephalic SVC junction.

Persistent left lung opacity but slight decrease in overall
consolidation. The right lung remains relatively clear.

The heart is stable in size.
IMPRESSION: 1. Stable support apparatus.
2. Persistent left lung airspace process with interval slightly less
consolidation.

## 2020-10-27 IMAGING — CT CT CHEST W/O CM
2 of 4 series · 15 of 36 positions shown, 18 images · non-contrast
Comparison: 07/19/2018, 07/18/2018, 07/17/2018 radiograph, CT chest
10/16/2010

CLINICAL DATA: Pleural effusion

EXAM:
CT CHEST WITHOUT CONTRAST
TECHNIQUE: Multidetector CT imaging of the chest was performed following the
standard protocol without IV contrast.

[Series 3: chest wo · axial · 0.78mm/px · z∈[-258,-16]mm · 12 of 144 slices shown, 15 images]
[im 12/144  mediastinal]
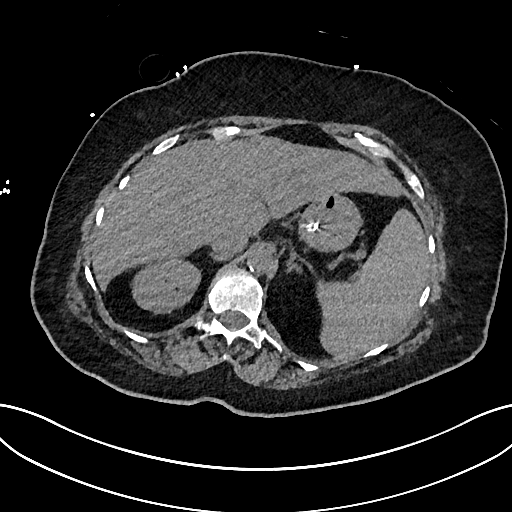
[im 12/144  lung]
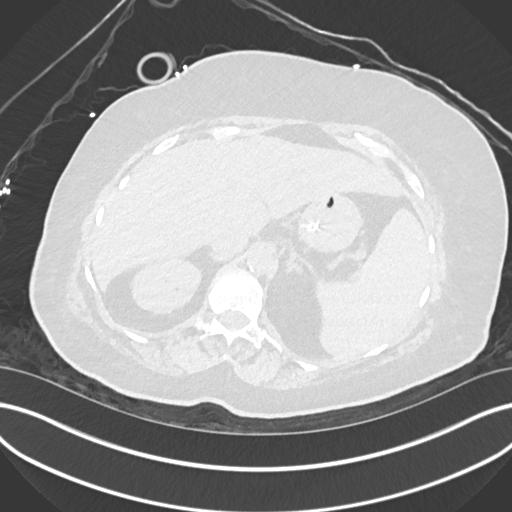
[im 23/144  lung]
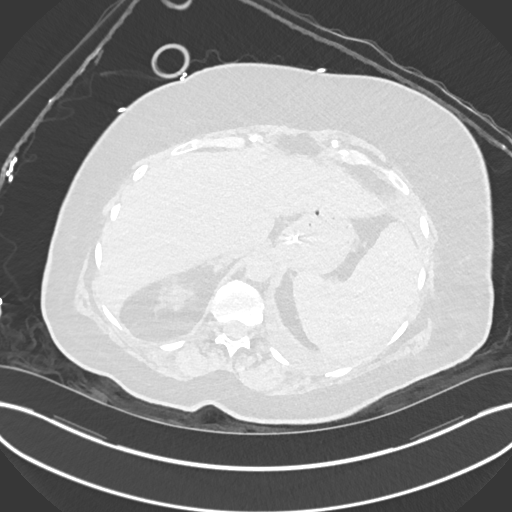
[im 34/144  lung]
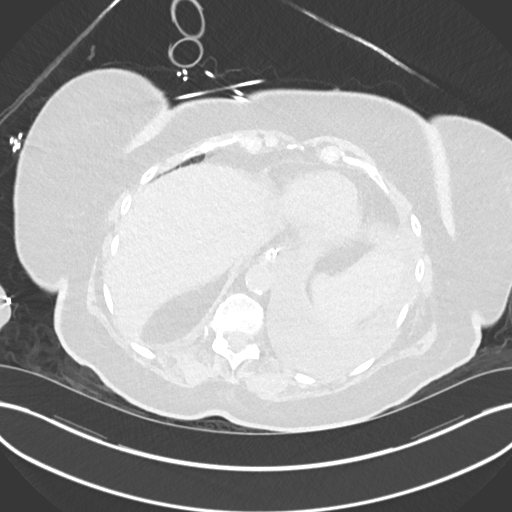
[im 45/144  lung]
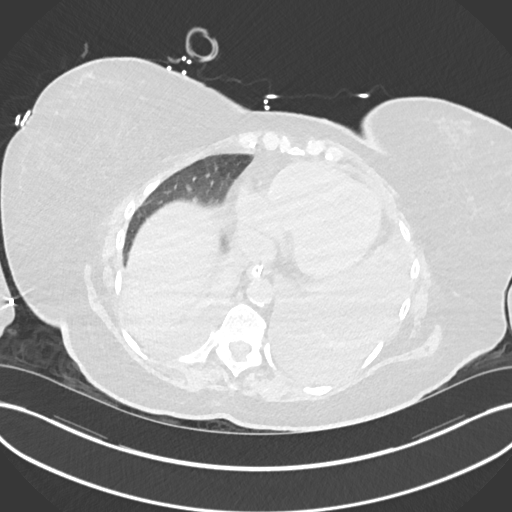
[im 56/144  mediastinal]
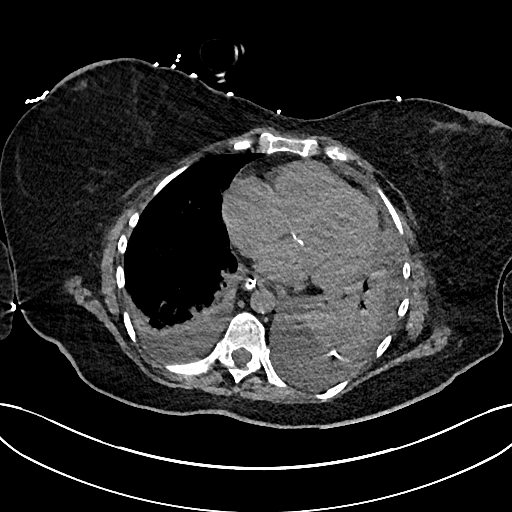
[im 56/144  lung]
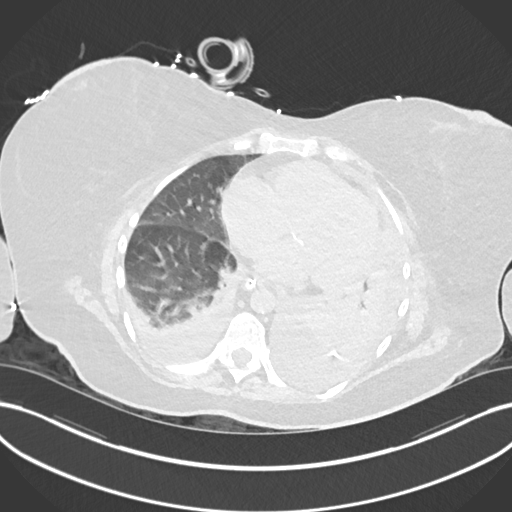
[im 67/144  lung]
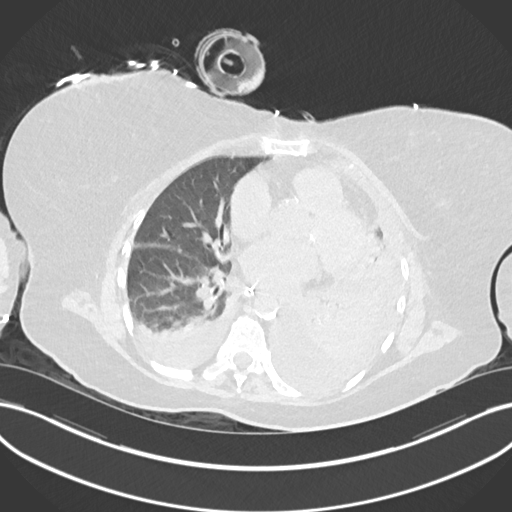
[im 78/144  lung]
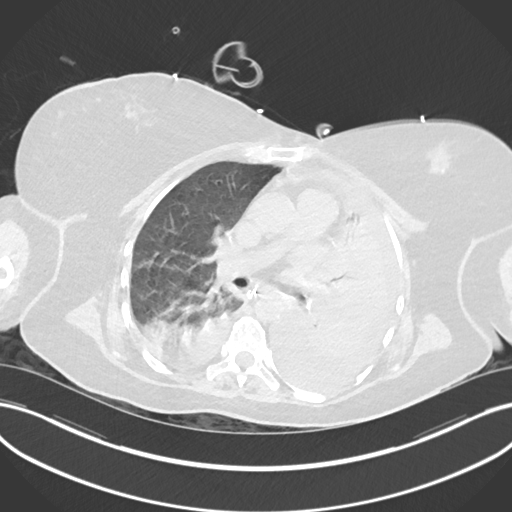
[im 89/144  lung]
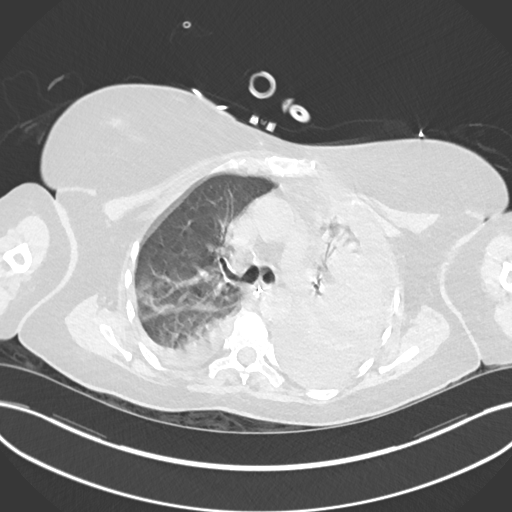
[im 100/144  mediastinal]
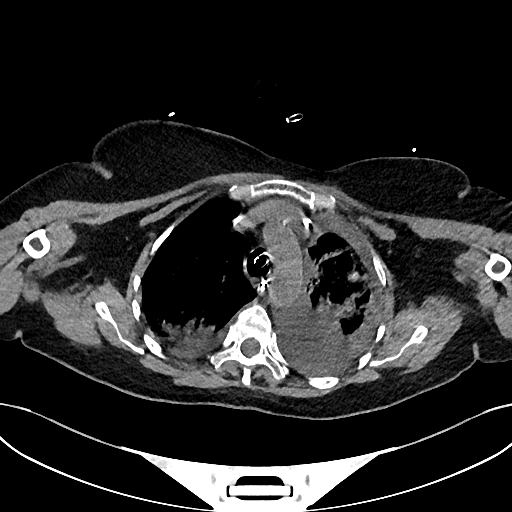
[im 100/144  lung]
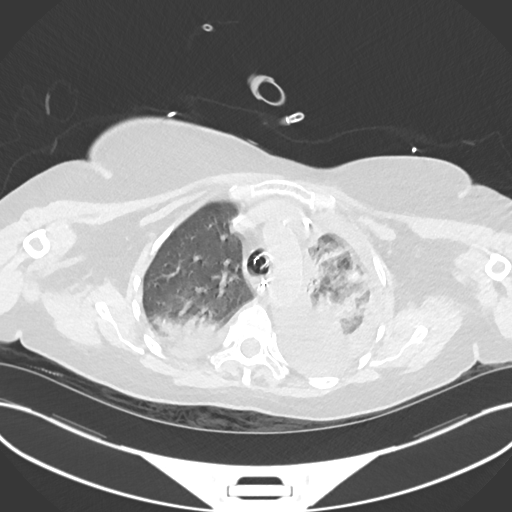
[im 111/144  lung]
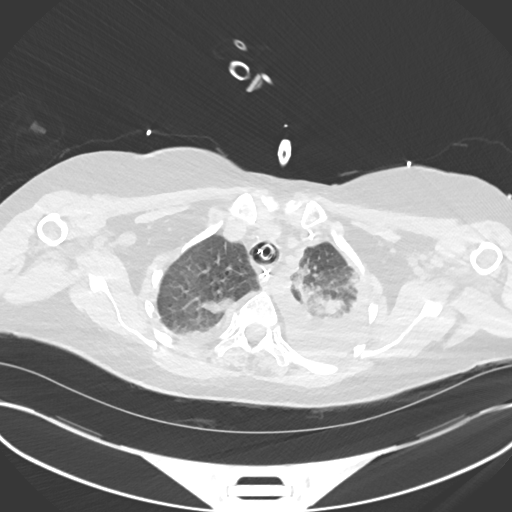
[im 122/144  lung]
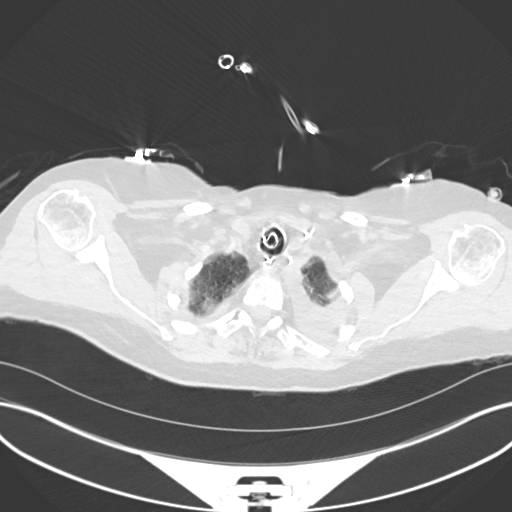
[im 133/144  lung]
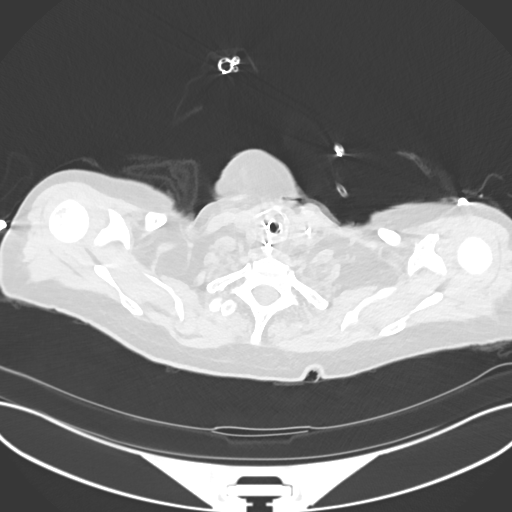

[Series 6: cor · coronal · 0.59mm/px · 3 of 151 slices shown]
[im 31/151  lung]
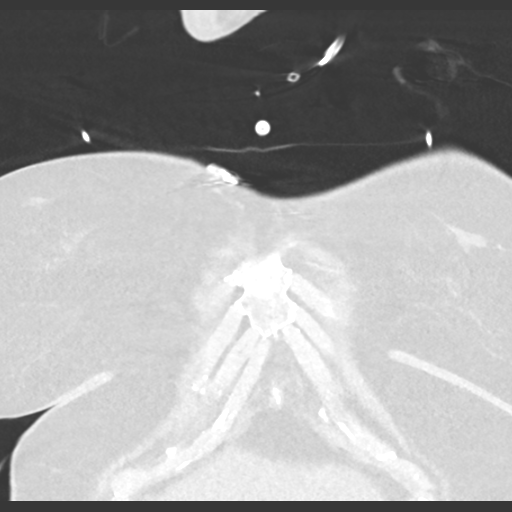
[im 61/151  lung]
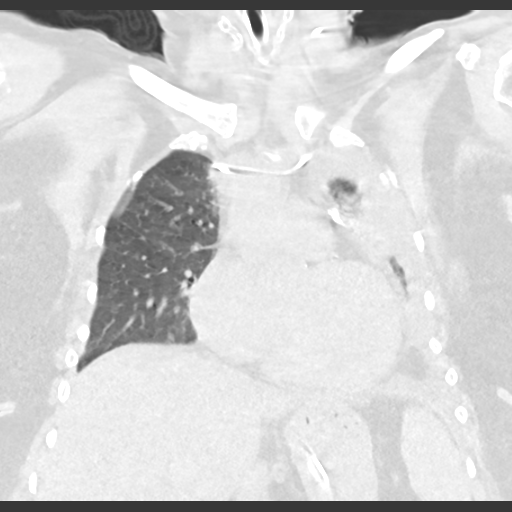
[im 91/151  lung]
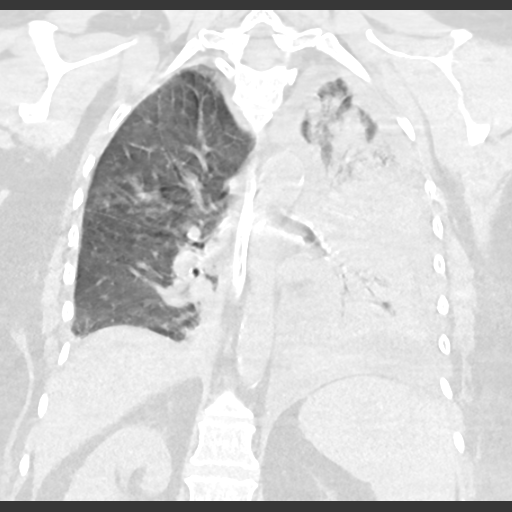

[15 of 36 positions shown; findings below may reference images not displayed]

FINDINGS: Cardiovascular: Limited evaluation without intravenous contrast.
Nonaneurysmal aorta. Moderate aortic atherosclerosis. Heart size
within normal limits. Trace pericardial effusion. Left-sided central
venous catheter tip at the distal brachiocephalic region.

Mediastinum/Nodes: Endotracheal tube tip about a cm superior to the
carina. Esophageal tube tip below the diaphragm but non included. No
thyroid mass. Dense calcification to the left of the pulmonary trunk
presumably a calcified node.

Lungs/Pleura: Small moderate right pleural effusion. Moderate to
large left pleural effusion. Diffuse consolidation throughout the
left lung. Partial consolidation and ground-glass density in the
right upper lobe and right lower lobe.

Upper Abdomen: 24 mm rounded hypodensity within the posterior
spleen. No acute abnormality in the upper abdomen.

Musculoskeletal: Degenerative changes without acute or suspicious
abnormality.
IMPRESSION: 1. Small moderate right pleural effusion and moderate to large left
pleural effusion.
2. Diffuse consolidation within the left lung with partial
consolidation and ground-glass density in the right upper and lower
lobes suspicious for multifocal pneumonia
3. Endotracheal tube tip just above the carina.
4. 24 mm rounded hypodensity within the spleen, incompletely
characterized. Consider further evaluation with MRI or
contrast-enhanced exam when clinically feasible.

Aortic Atherosclerosis ([id number]).

## 2020-10-27 IMAGING — DX DG CHEST 1V PORT
1 series · 1 of 1 positions shown · non-contrast
Comparison: 07/18/2018

CLINICAL DATA: Followup ventilator support.  Respiratory failure.

EXAM:
PORTABLE CHEST 1 VIEW

[chest]
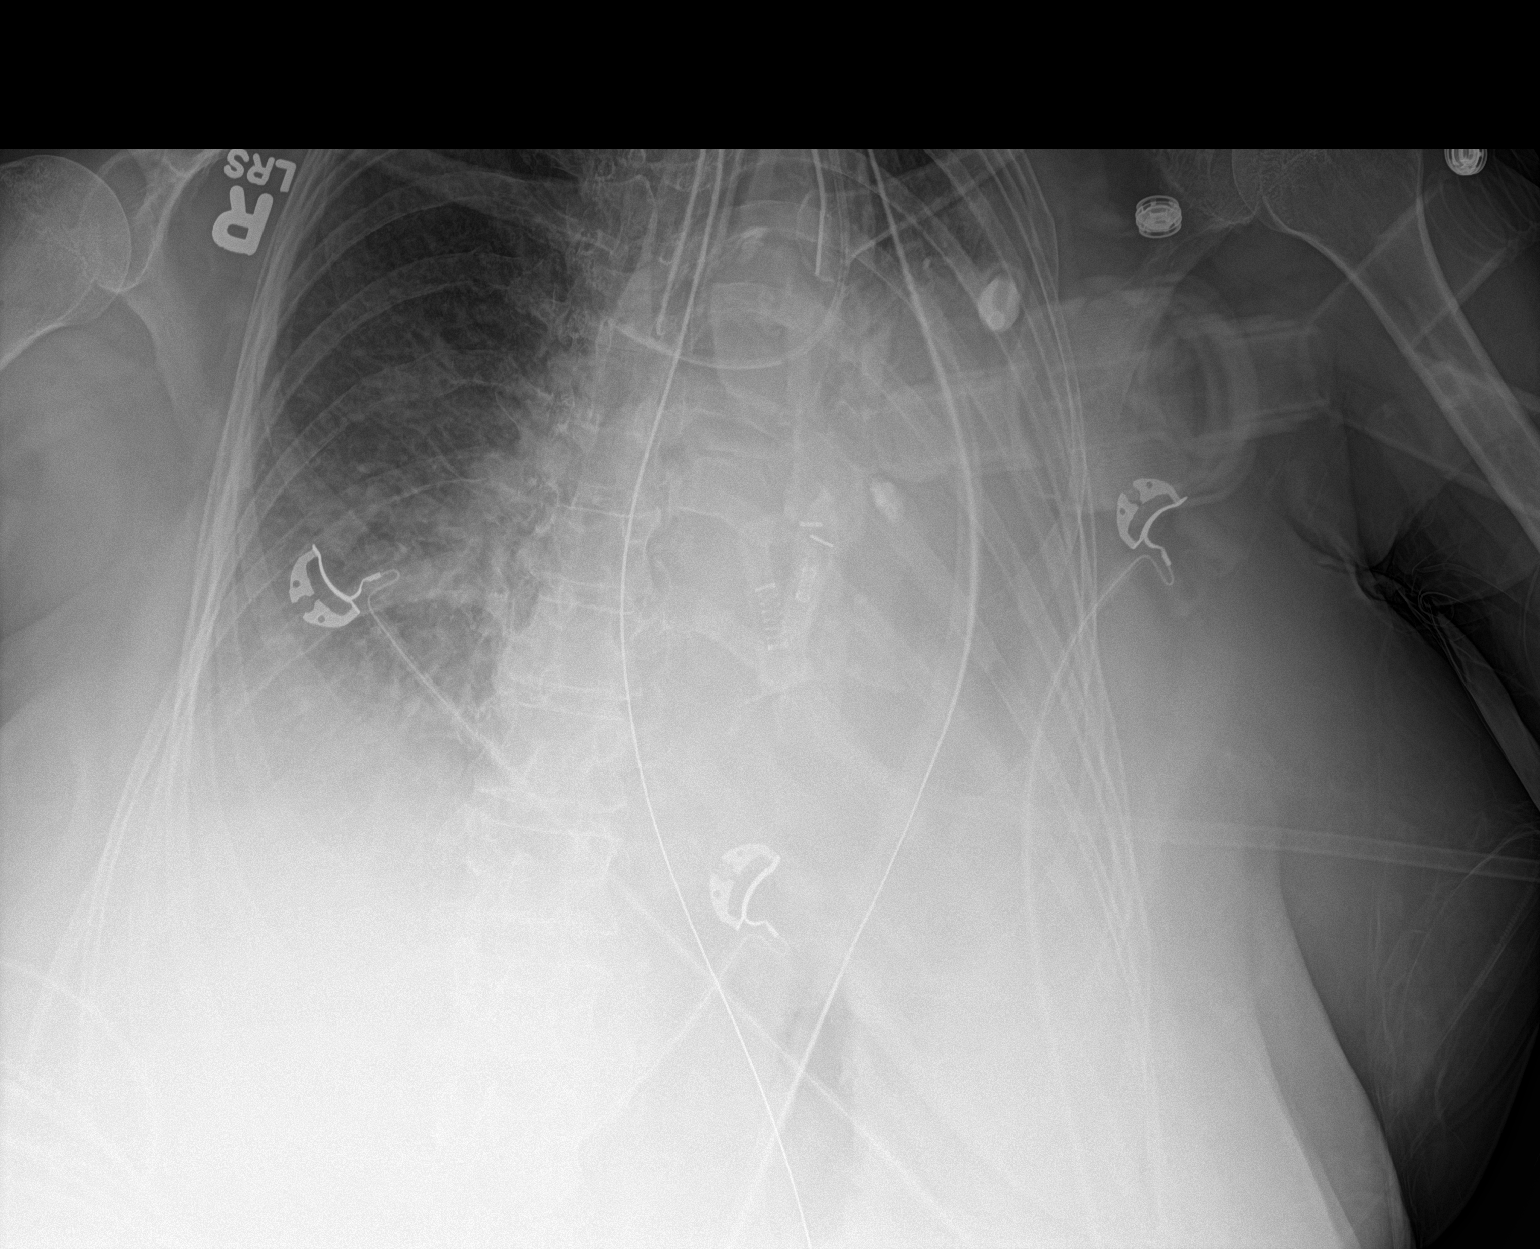

[1 of 1 positions shown; findings below may reference images not displayed]

FINDINGS: Endotracheal tube tip is 2 cm above the carina. Nasogastric tube
enters the stomach. Left internal jugular central line tip is at the
junction of the innominate vein and SVC. Persistent and possibly
slightly increased effusions and atelectasis, left more than right.
IMPRESSION: Persistent and possibly slightly increased effusions and
atelectasis, left more than right.

## 2020-10-28 IMAGING — DX DG CHEST 1V PORT
1 series · 1 of 1 positions shown · non-contrast
Comparison: Chest x-ray 07/19/2018.

CLINICAL DATA: 72-year-old female with history of pneumonia.

EXAM:
PORTABLE CHEST 1 VIEW

[chest]
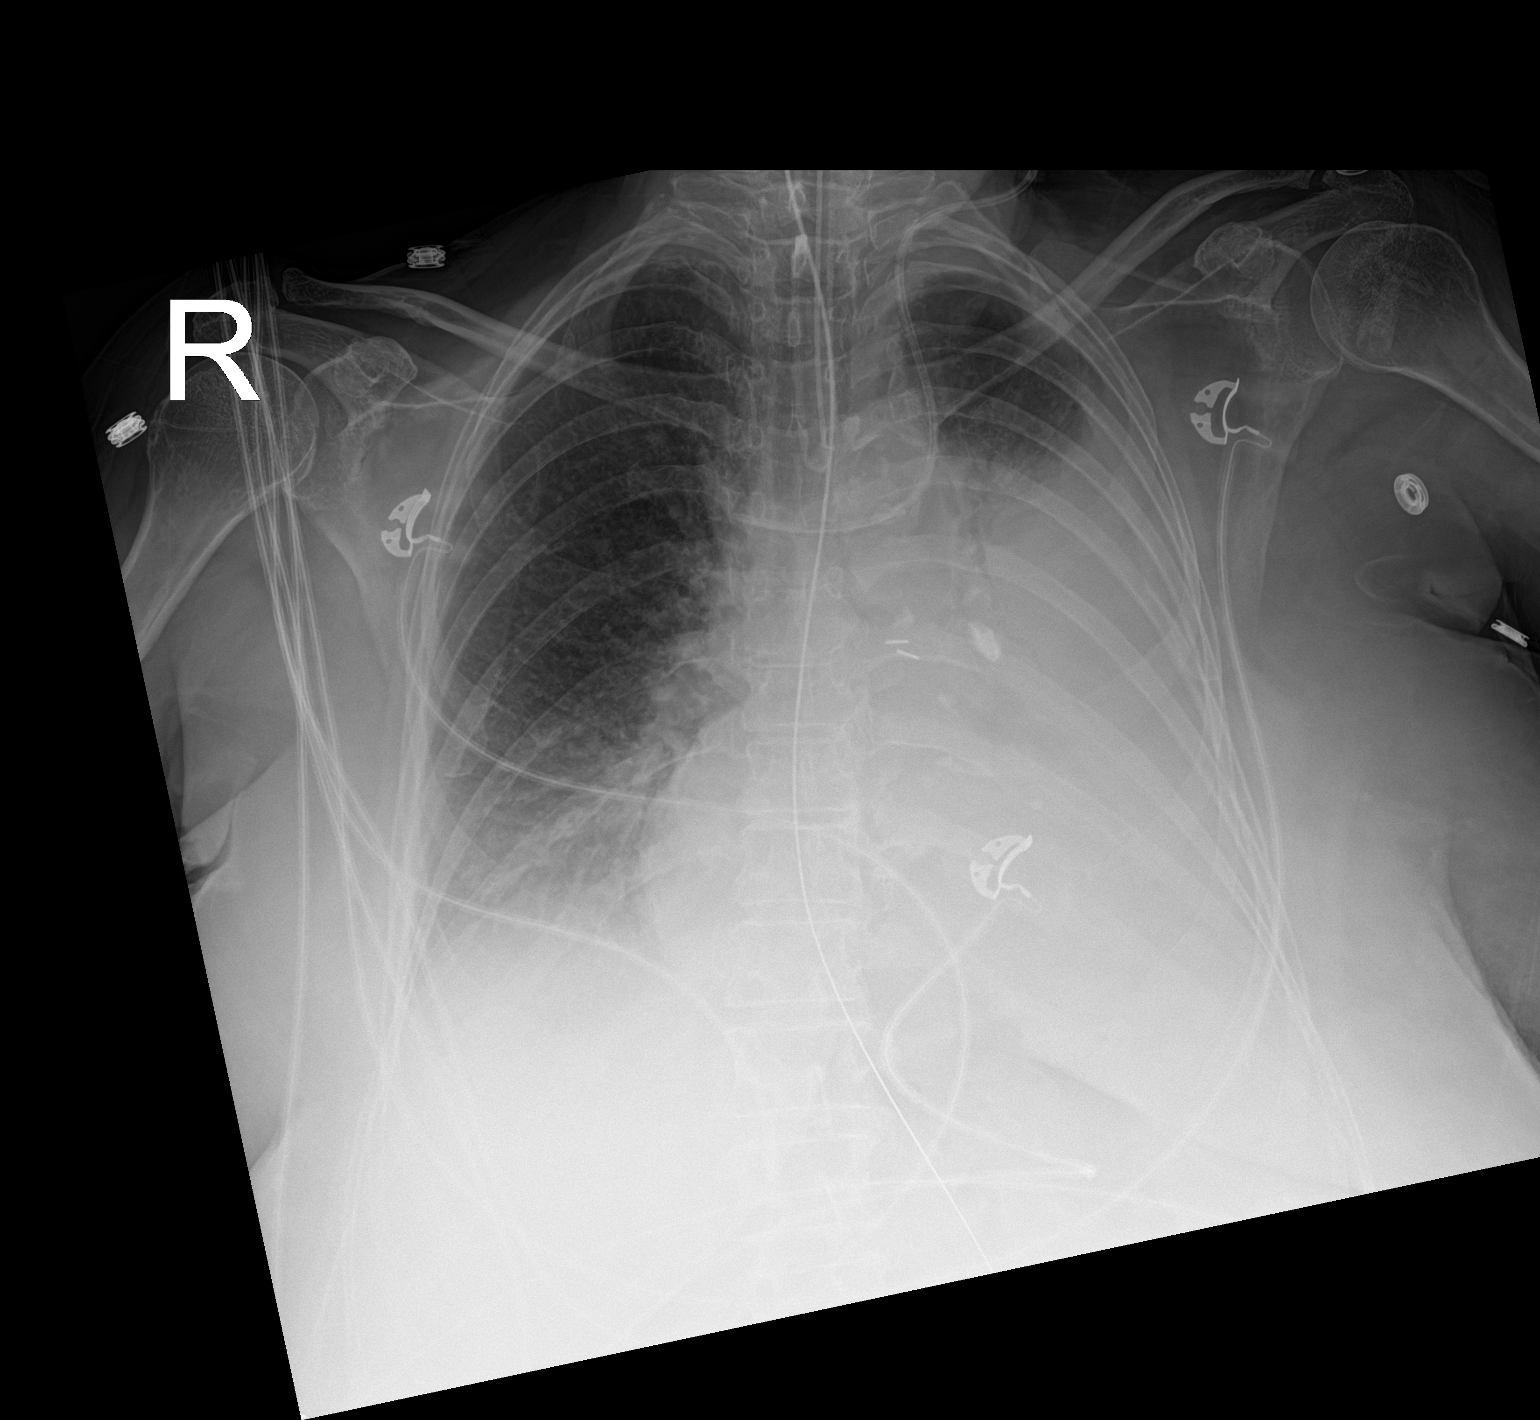

[1 of 1 positions shown; findings below may reference images not displayed]

FINDINGS: There is a left-sided internal jugular central venous catheter with
tip terminating in the proximal superior vena cava. An endotracheal
tube is in place with tip 2.9 cm above the carina. A nasogastric
tube is seen extending into the stomach, however, the tip of the
nasogastric tube extends below the lower margin of the image. Dense
airspace consolidation throughout much of the mid to left lower
lung. Opacity at the right lung base which may reflect an additional
area of atelectasis and/or airspace consolidation. Small right
pleural effusion. Moderate to large left pleural effusion. No
evidence of pulmonary edema. Cardiac silhouette is largely obscured.
Aortic atherosclerosis. Surgical clips projecting over the mid left
hemithorax.
IMPRESSION: 1. Support apparatus, as above.
2. Persistent bilateral pleural effusions (left greater than right)
with areas of atelectasis and/or consolidation throughout the lungs
bilaterally, most severe throughout the left lung, as detailed
above.

## 2020-10-28 IMAGING — DX DG CHEST 1V PORT
1 series · 1 of 1 positions shown · non-contrast
Comparison: Earlier film of the same day

CLINICAL DATA: Chest tube inserted, check position

EXAM:
PORTABLE CHEST - 1 VIEW

[chest]
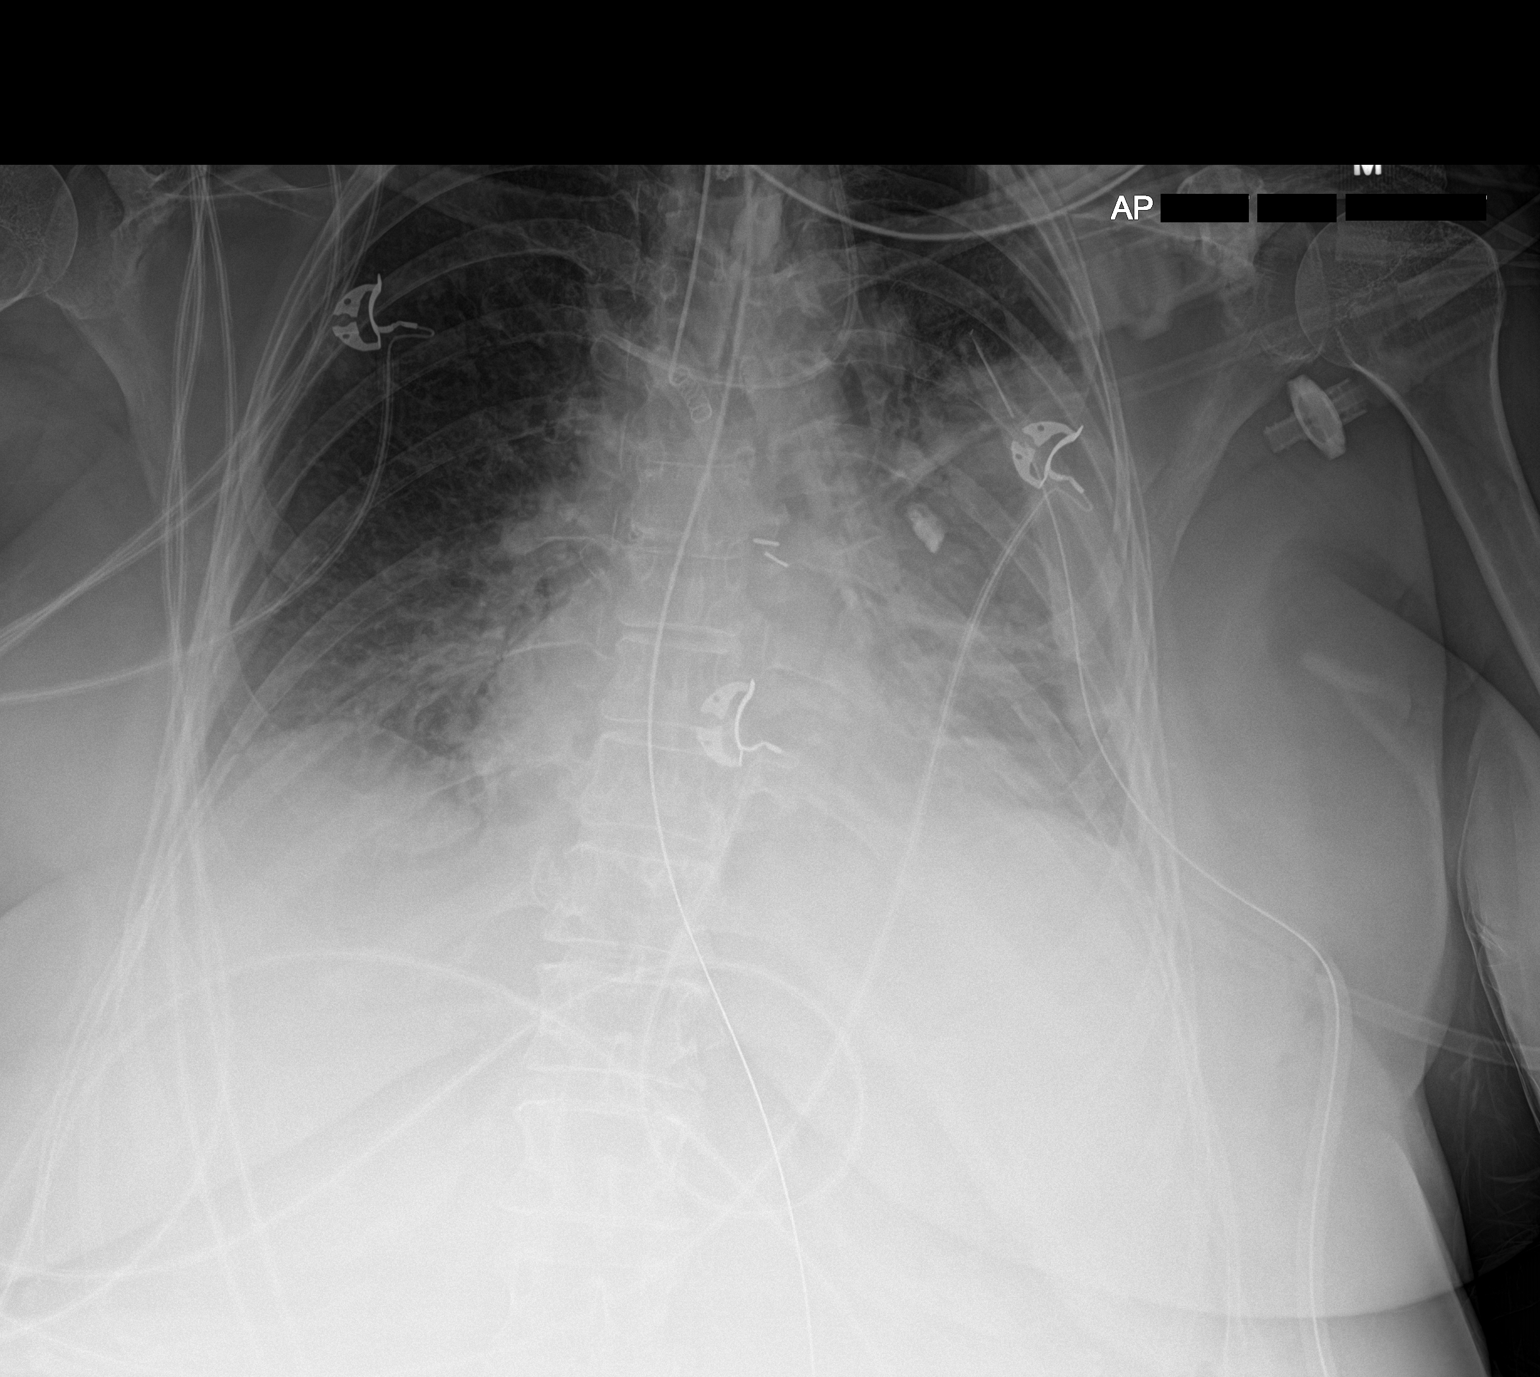

[1 of 1 positions shown; findings below may reference images not displayed]

FINDINGS: Interval placement of left chest tube directed towards the
suprahilar region. Interval decrease in left pleural effusion.
Probable small residual lateral left pleural effusion. No
pneumothorax. Patchy airspace opacities persist in the mid to lower
left lung, with improved aeration. Calcified left hilar lymph node.
Right basilar airspace opacity stable.

The endotracheal tube tip is less than 2 cm above the carina.
Gastric tube and left IJ central line stable in position.

Heart size and mediastinal contours are within normal limits.

Visualized bones unremarkable.
IMPRESSION: Left chest tube placement with decrease in left pleural effusion. No
pneumothorax.

## 2020-10-29 IMAGING — DX DG CHEST 1V PORT
1 series · 1 of 1 positions shown · non-contrast
Comparison: 07/20/2018 and earlier.

CLINICAL DATA: 72-year-old female with abnormal LFTs. Intubated,
pleural effusion and airspace opacity.

EXAM:
PORTABLE CHEST 1 VIEW

[chest ap]
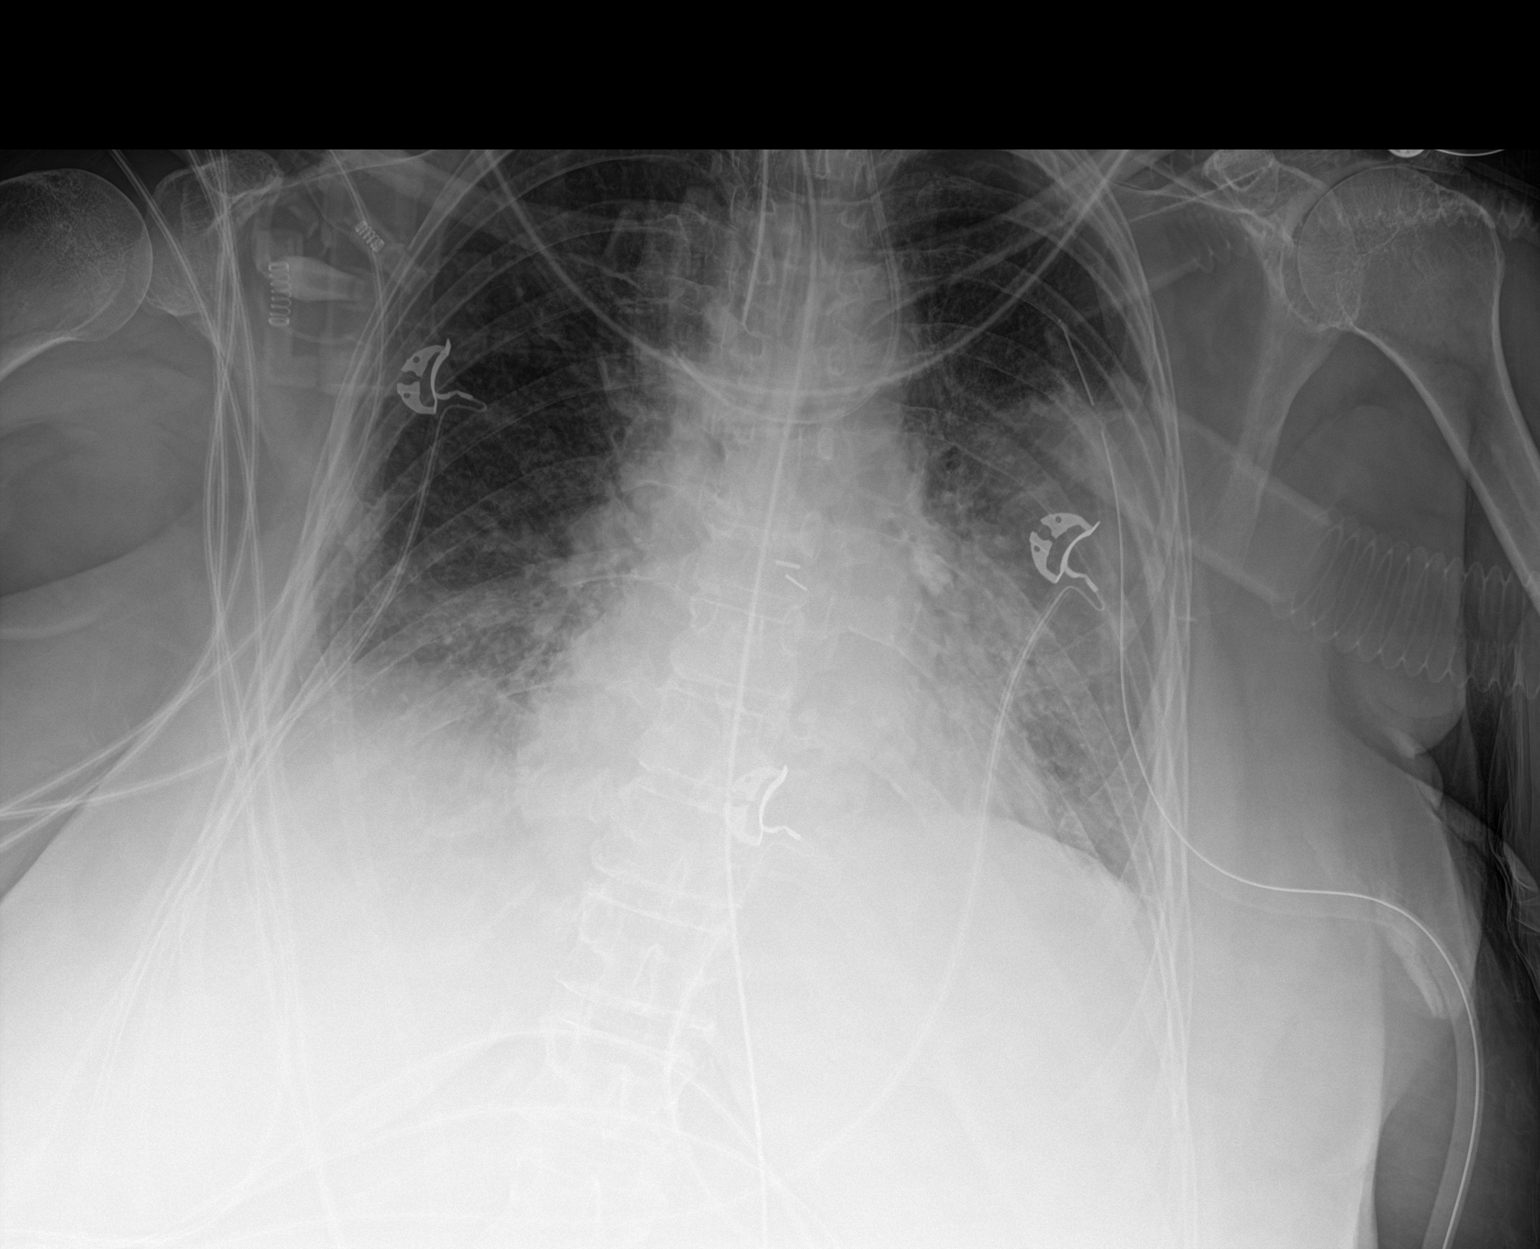

[1 of 1 positions shown; findings below may reference images not displayed]

FINDINGS: Portable AP semi upright view at [id number] hours. Endotracheal tube tip
in good position just below the clavicles. Enteric tube courses to
the abdomen, tip not included. Left chest tube remains in place.
Substantially improved bilateral ventilation since the CT on
07/19/2018, and further improved left lung base ventilation since
yesterday. Residual confluent left mid lung opacity. Residual
streaky opacity at the right lung base. No pneumothorax or pulmonary
edema. Normal cardiac size and mediastinal contours. Paucity of
bowel gas in the upper abdomen.
IMPRESSION: 1. Stable lines and tubes.
2. Further improved ventilation since yesterday. Residual left mid
lung and right lung base opacity.

## 2020-10-29 IMAGING — US US ABDOMEN LIMITED
1 series · 14 of 25 positions shown · non-contrast
Comparison: Chest CT without contrast 07/19/2018. Lumbar MRI
06/07/2018.

CLINICAL DATA: 72-year-old female with abnormal LFTs. Intubated,
pleural effusion and airspace opacity.

EXAM:
ULTRASOUND ABDOMEN LIMITED RIGHT UPPER QUADRANT

[Series 1: us abdomen limited · 14 of 37 slices shown]
[im 1/37]
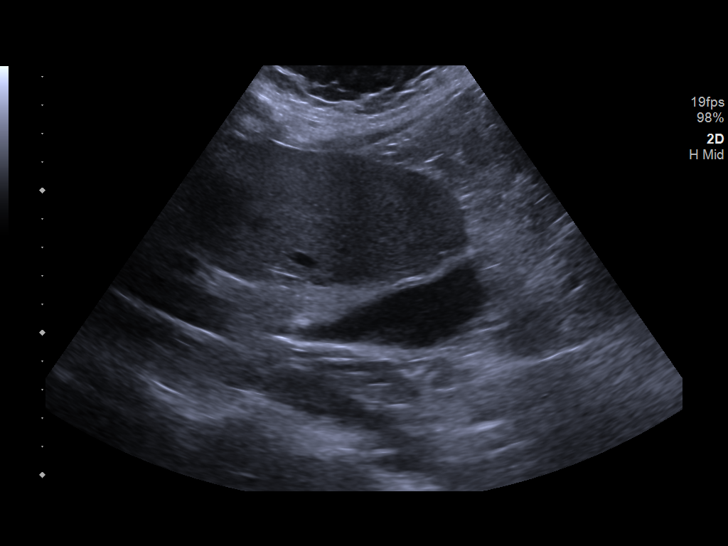
[im 4/37]
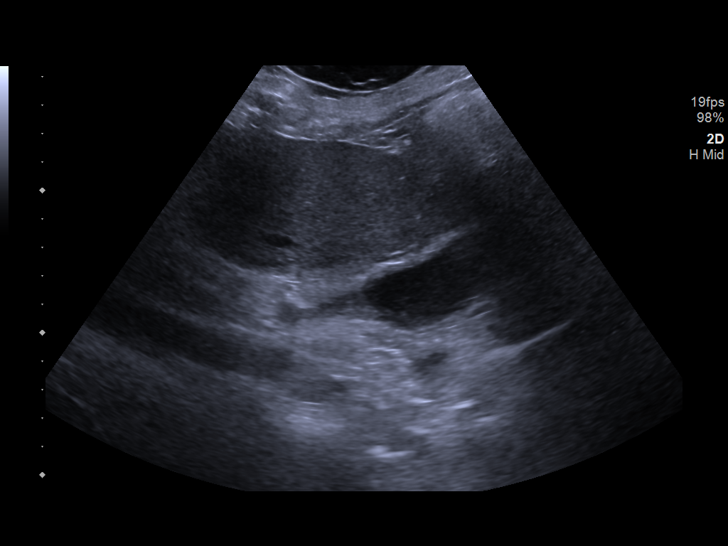
[im 7/37]
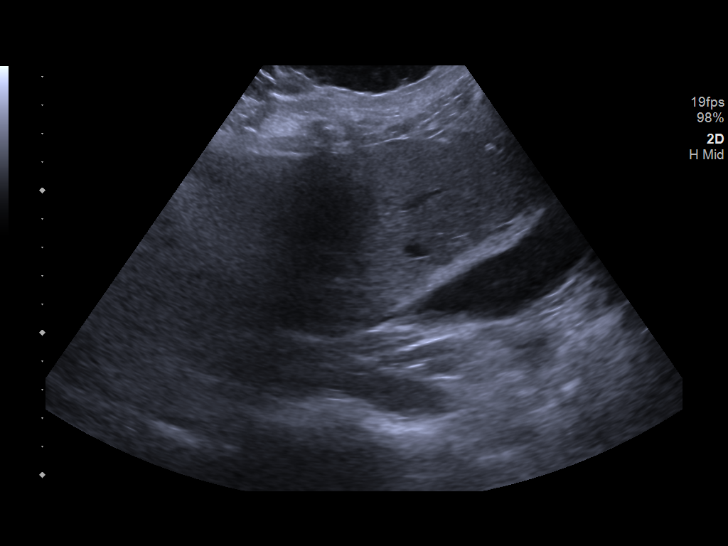
[im 10/37]
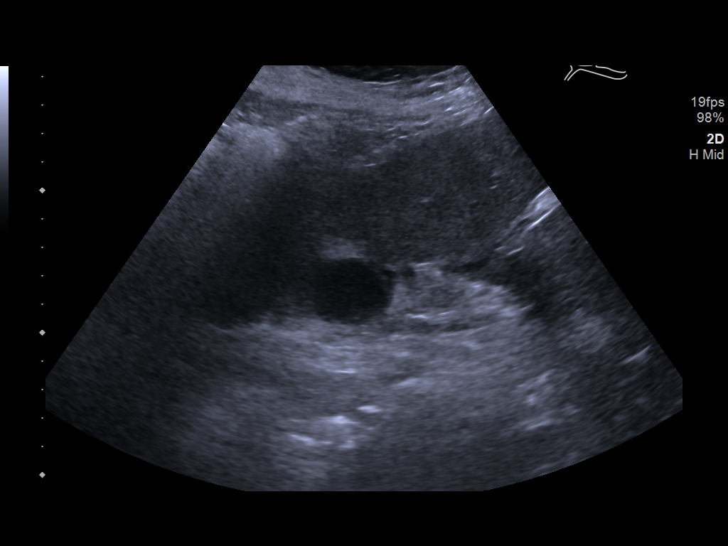
[im 13/37]
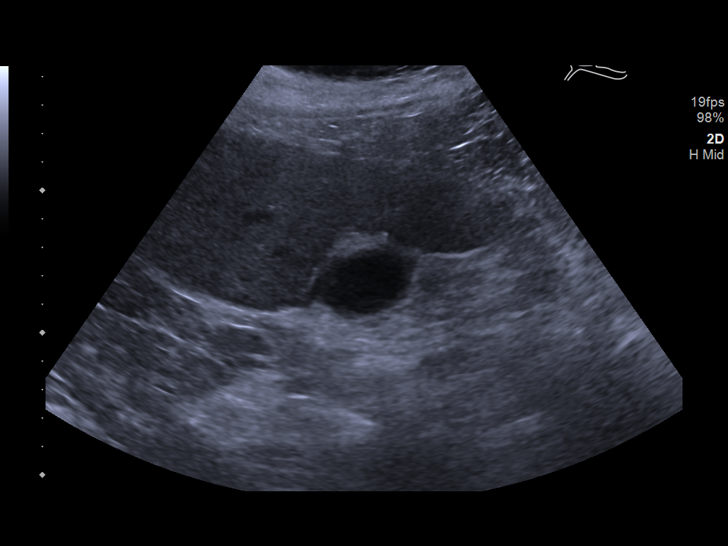
[im 14/37]
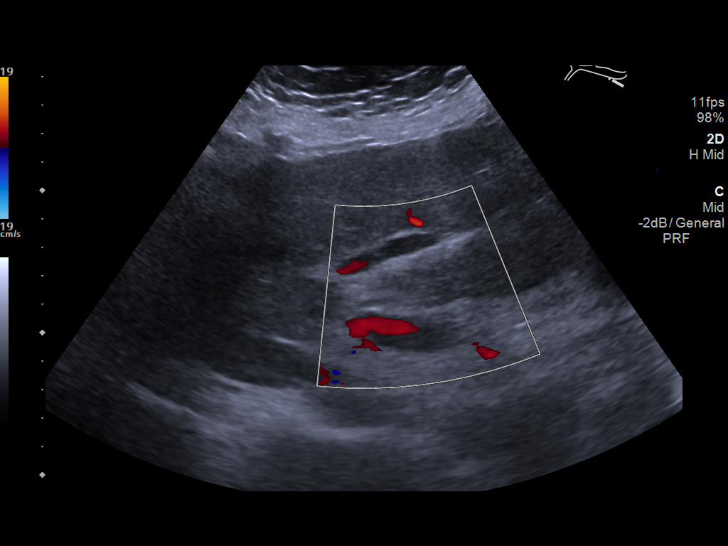
[im 17/37]
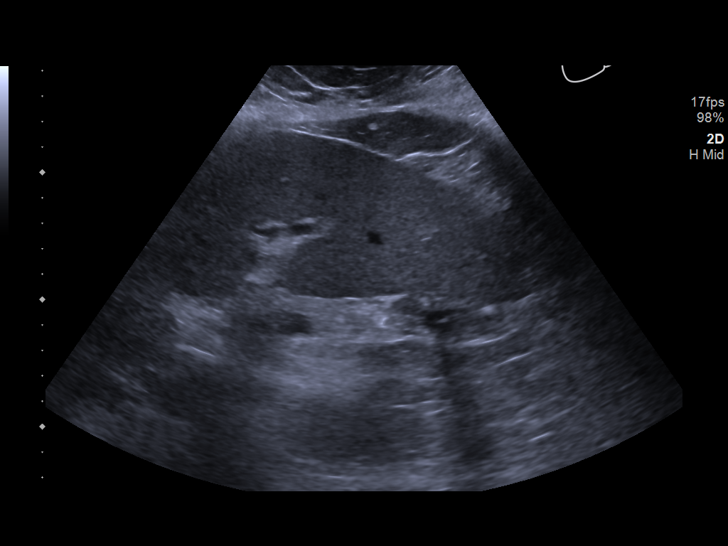
[im 20/37]
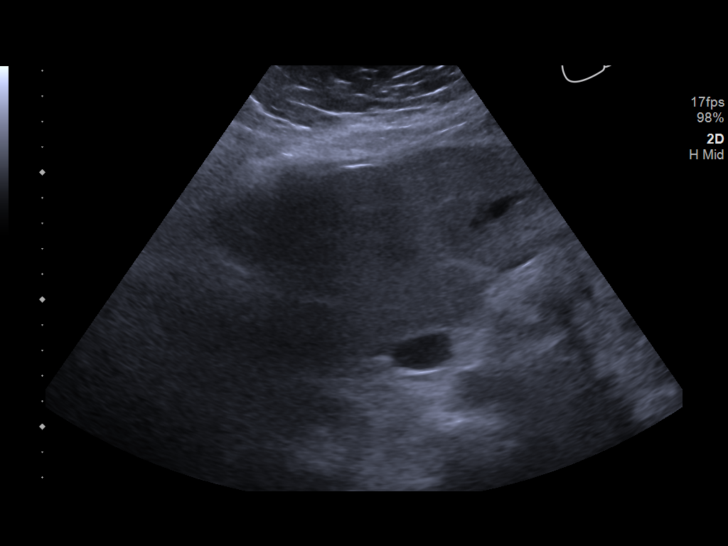
[im 23/37]
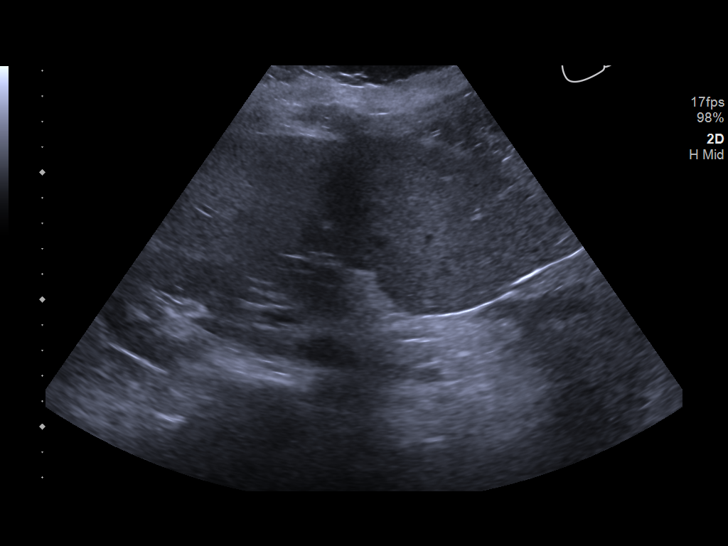
[im 25/37]
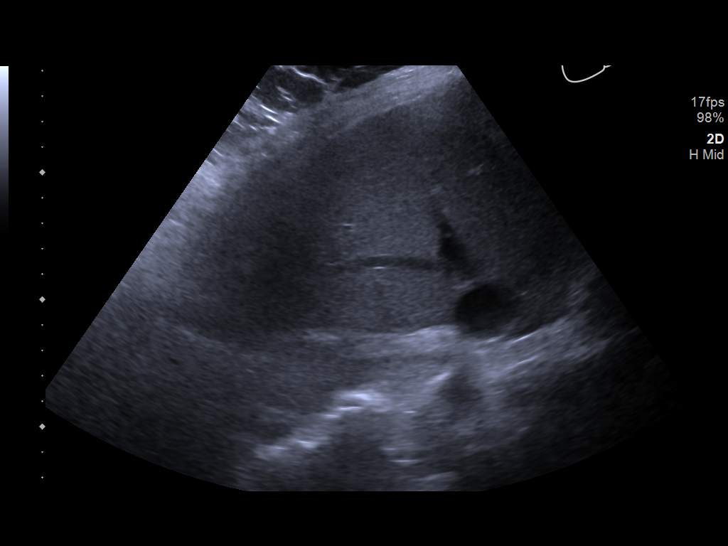
[im 28/37]
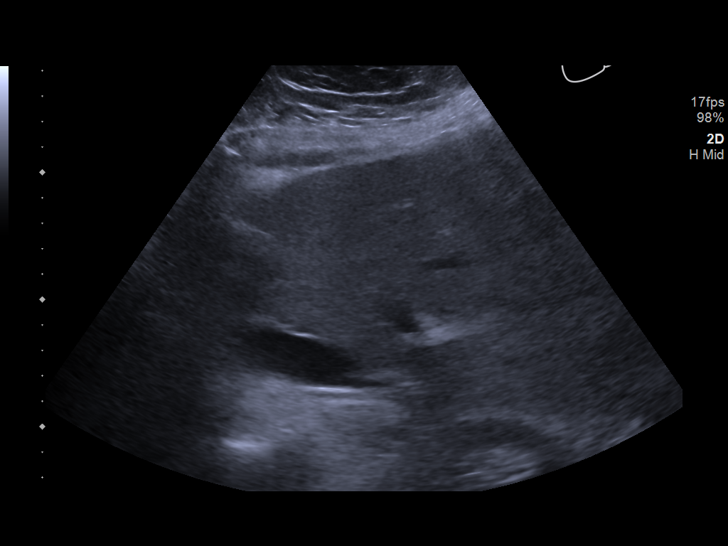
[im 31/37]
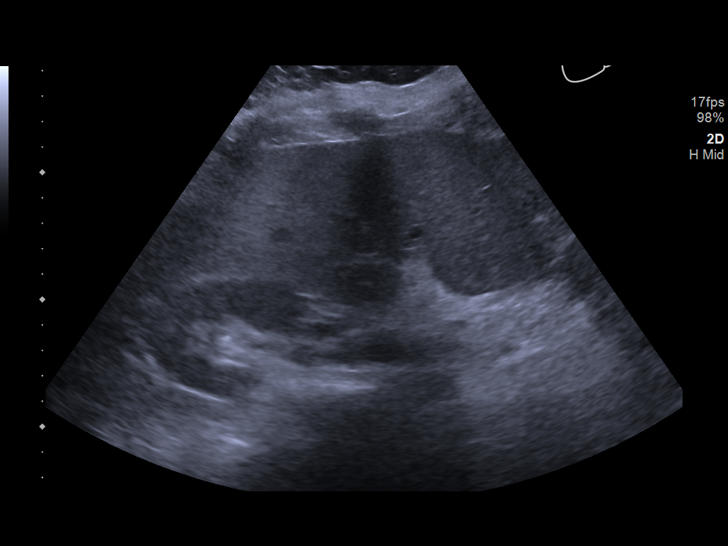
[im 34/37]
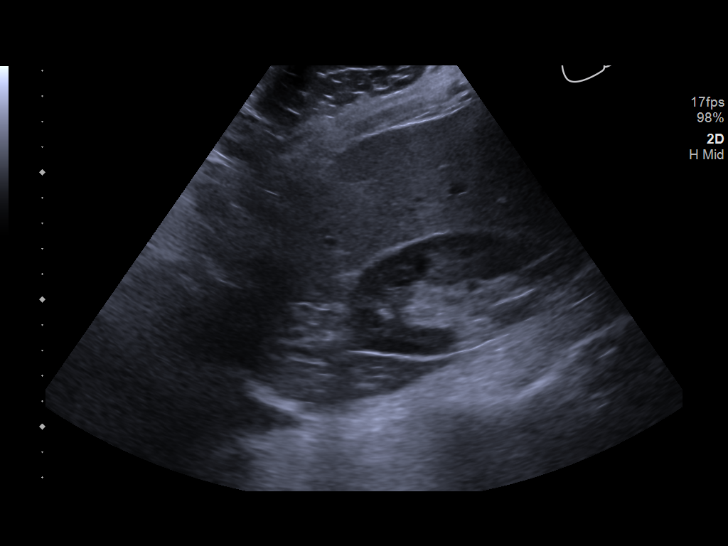
[im 37/37]
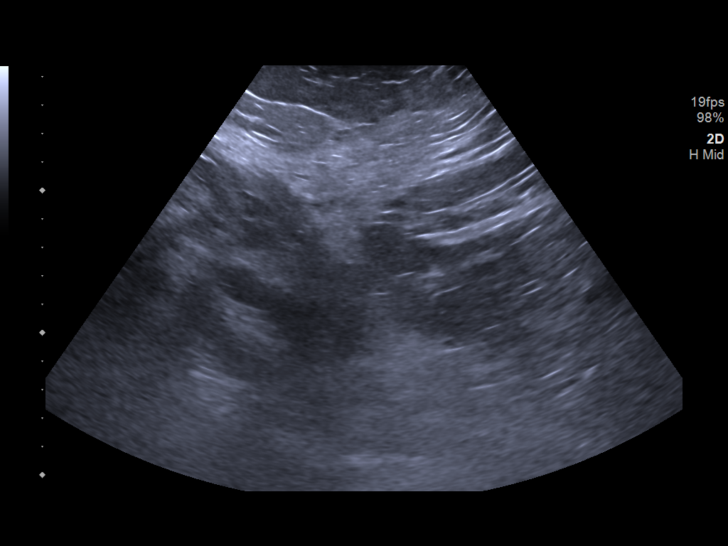

[14 of 25 positions shown; findings below may reference images not displayed]

FINDINGS: Gallbladder:

Borderline to mild gallbladder wall thickening. Possible dependent
sludge, but the gallbladder lumen appears free of echogenic stones.
No pericholecystic fluid. Sonographic Murphy sign could not be
evaluated as the patient is intubated.

Common bile duct:

Diameter: 3 millimeters, normal.

Liver:

No focal lesion identified. Liver echogenicity at the upper limits
of normal. Portal vein is patent on color Doppler imaging with
normal direction of blood flow towards the liver.

Other findings: Negative visible right kidney. No free fluid in the
lower quadrants.
IMPRESSION: 1. Gallbladder sludge with borderline to mild wall thickening.
2. No evidence of bile duct obstruction. Liver within normal limits.

## 2020-10-30 IMAGING — DX DG CHEST 1V PORT
1 series · 1 of 1 positions shown · non-contrast
Comparison: Yesterday

CLINICAL DATA: Pneumonia

EXAM:
PORTABLE CHEST 1 VIEW

[chest]
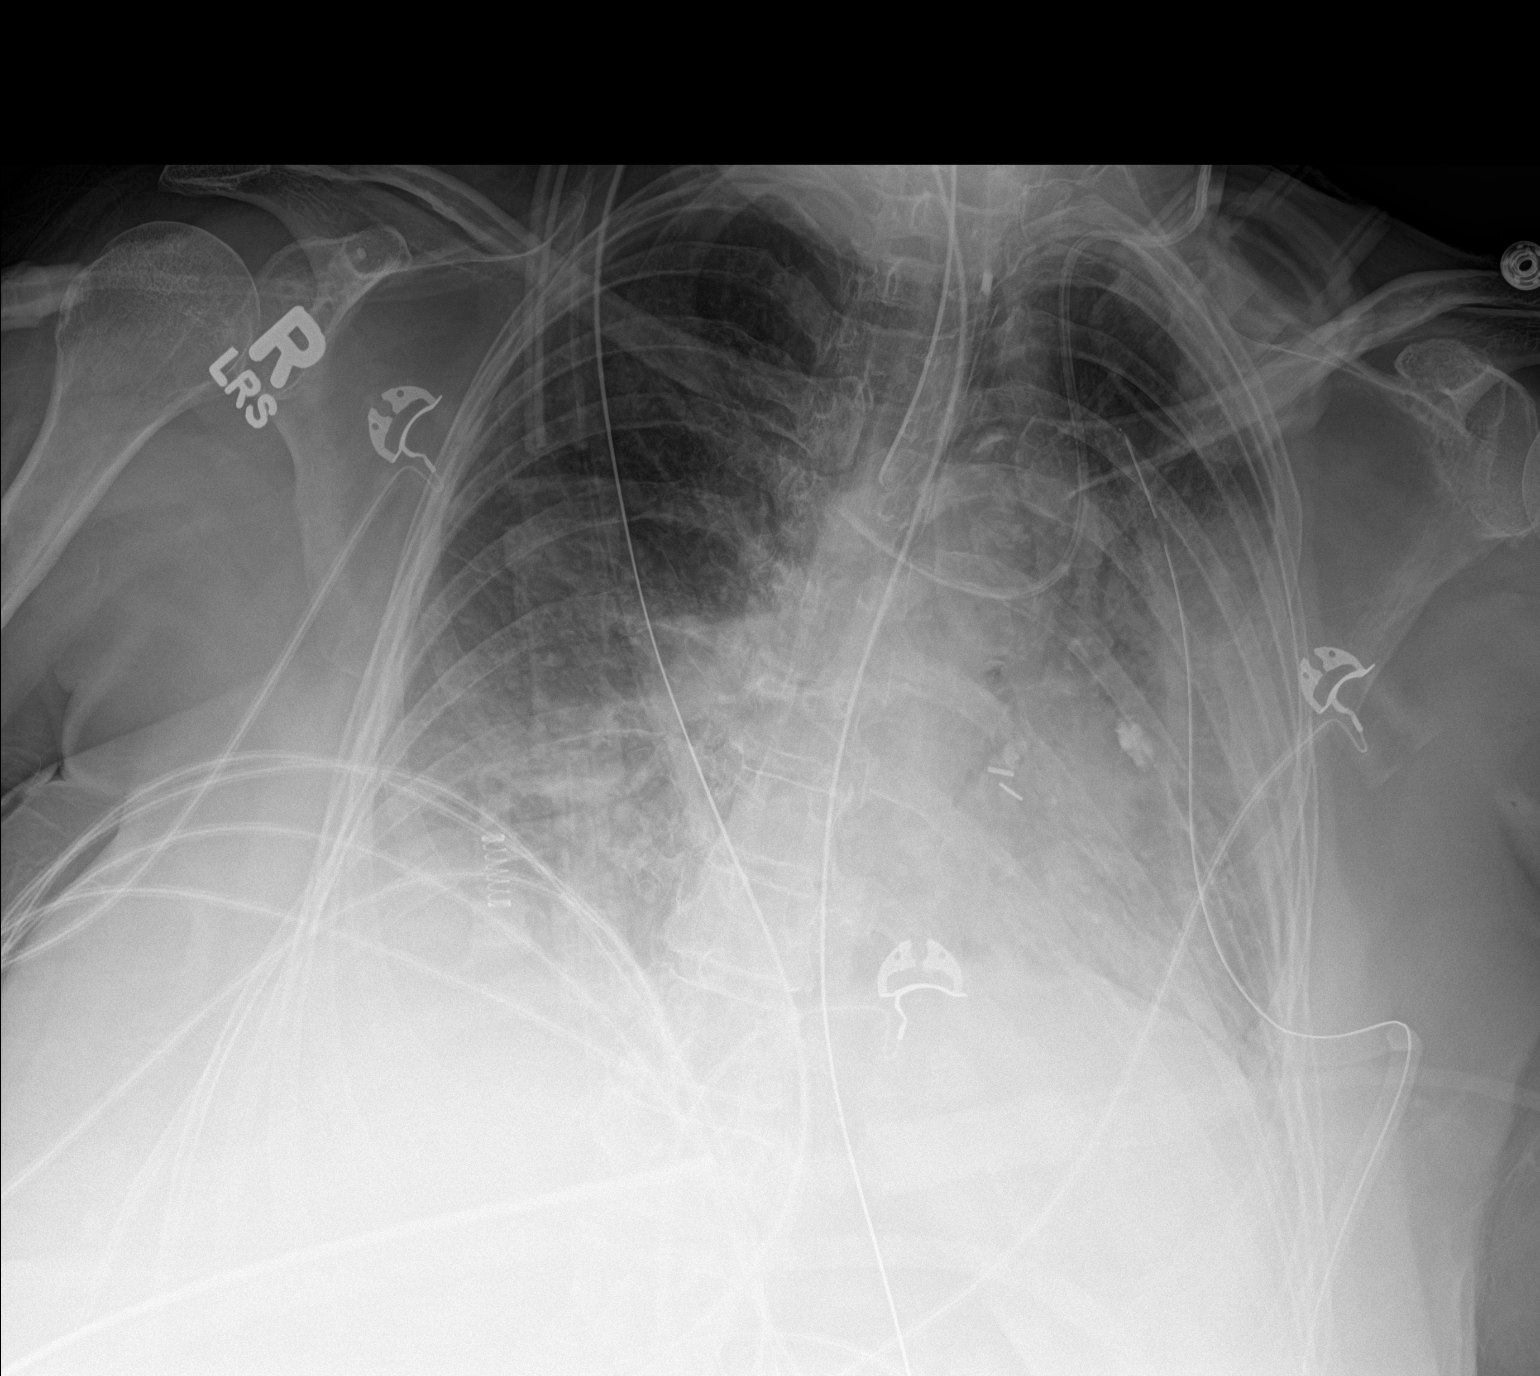

[1 of 1 positions shown; findings below may reference images not displayed]

FINDINGS: Endotracheal tube tip at the clavicular heads. Orogastric tube at
least reaches the stomach. Left IJ line with tip at the right
brachiocephalic vein, unchanged. Left-sided chest tube in stable
position.

History of pneumonia with hazy bilateral lung opacity. No convincing
change from prior when allowing for differences in technique. No
visible pneumothorax. Stable heart size.
IMPRESSION: 1. Stable hardware positioning.
2. No convincing change in left more than right pneumonia.

## 2020-10-30 IMAGING — CR DG CHEST 1V PORT
1 series · 1 of 1 positions shown · non-contrast
Comparison: Radiograph July 22, 2018.

CLINICAL DATA: Increased respirations.

EXAM:
PORTABLE CHEST 1 VIEW

[AP]
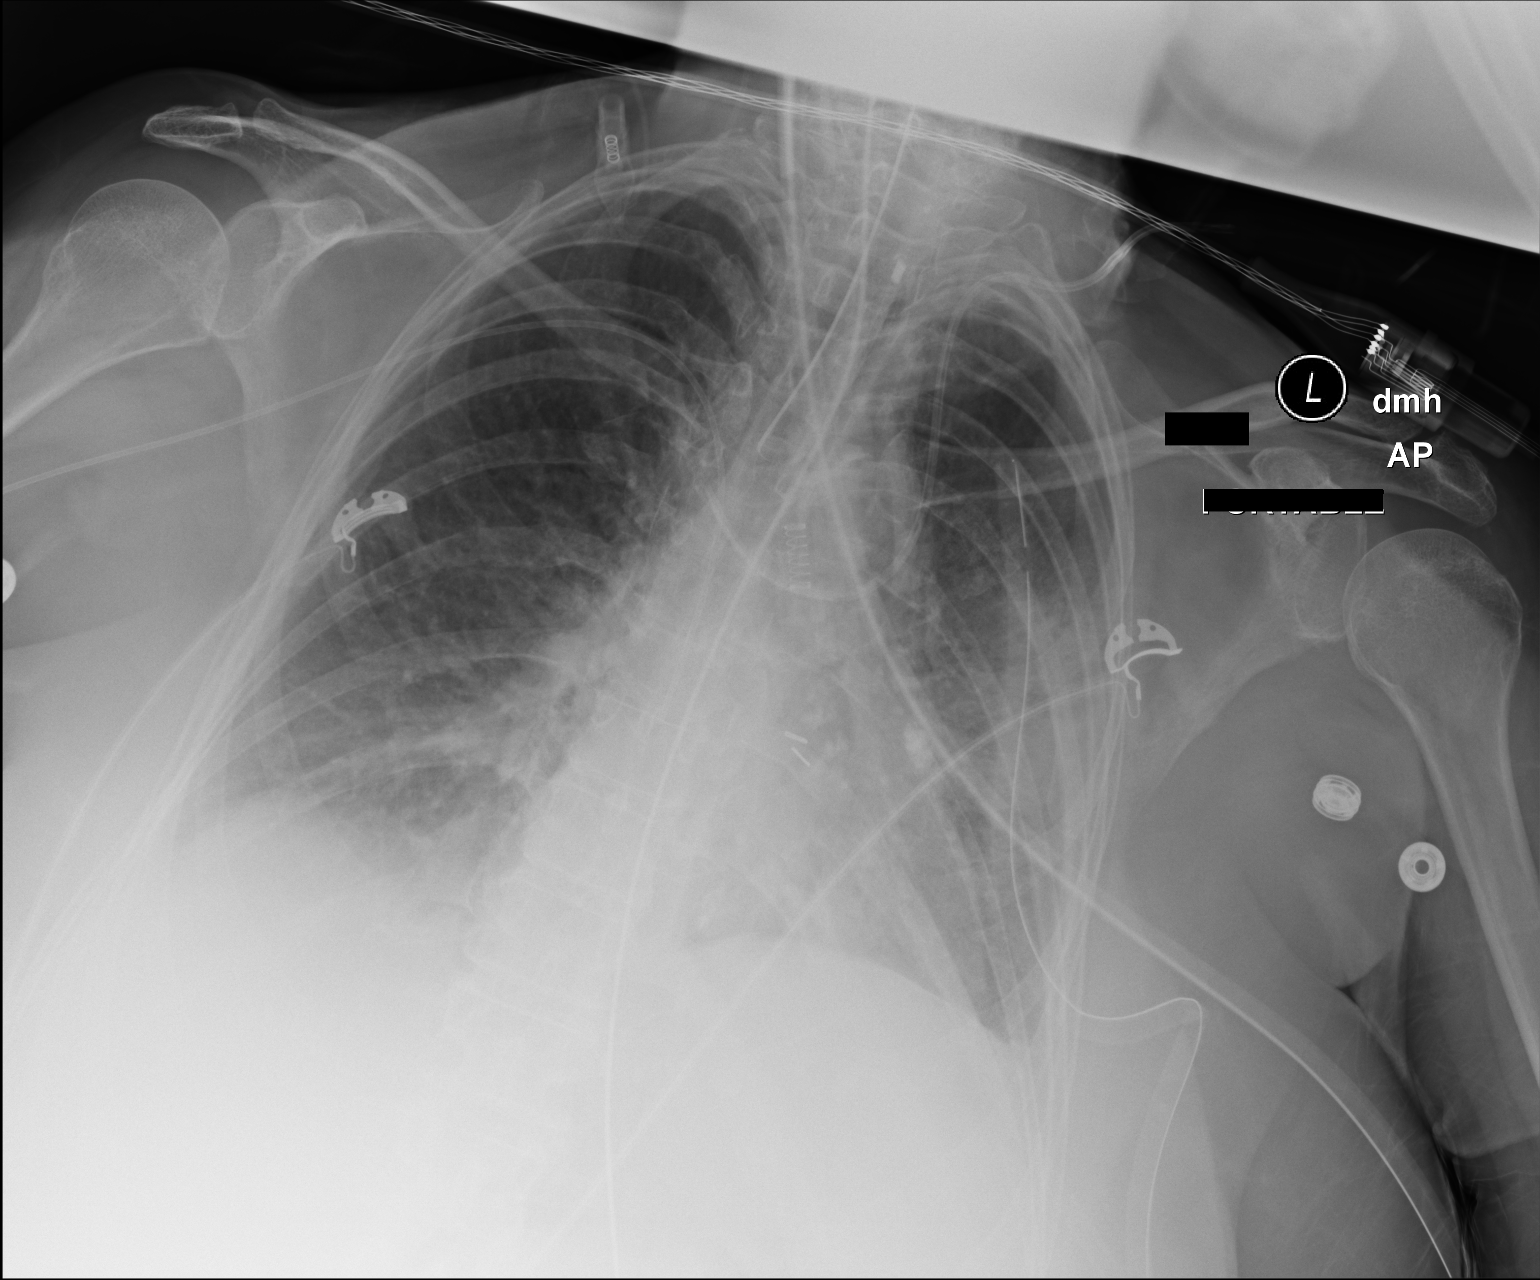

[1 of 1 positions shown; findings below may reference images not displayed]

FINDINGS: Stable cardiomediastinal silhouette. Endotracheal and nasogastric
tubes are unchanged in position. Left-sided chest tube is noted
without pneumothorax. Left internal jugular catheter is unchanged in
position. Interval placement of right-sided PICC line with distal
tip in expected position of cavoatrial junction. Mild bibasilar
subsegmental atelectasis is noted with probable small pleural
effusions. Bony thorax is unremarkable.
IMPRESSION: Stable support apparatus. Left-sided chest tube is noted without
pneumothorax. Interval placement of right-sided PICC line with
distal tip in expected position of cavoatrial junction. Stable
bibasilar subsegmental atelectasis with small pleural effusions.

## 2020-10-31 IMAGING — DX DG ABD PORTABLE 1V
1 series · 1 of 1 positions shown · non-contrast
Comparison: None.

CLINICAL DATA: Evaluate feeding tube

EXAM:
PORTABLE ABDOMEN - 1 VIEW

[abdomen kub]
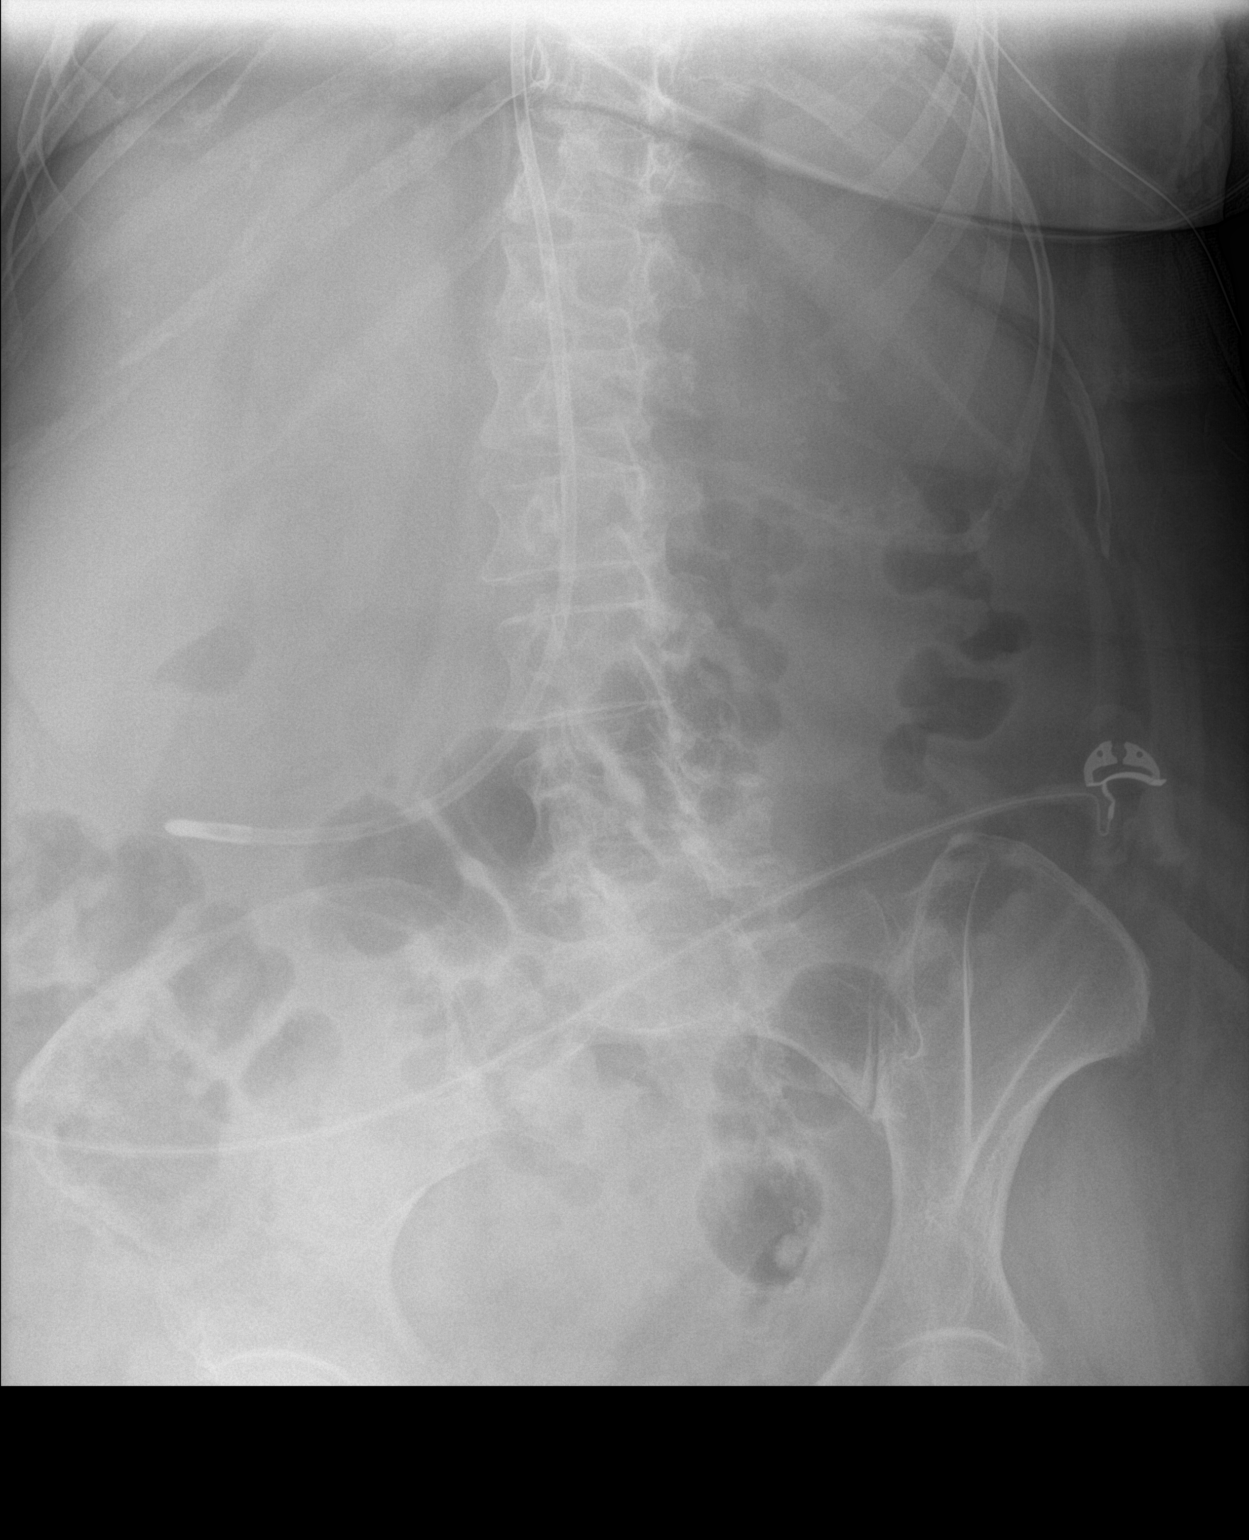

[1 of 1 positions shown; findings below may reference images not displayed]

FINDINGS: The feeding tube terminates in the right lower quadrant of the
abdomen, likely in the distal stomach.
IMPRESSION: The feeding tube terminates in the right lower quadrant. The tip is
favored to be in the distal aspect of the distended stomach.

## 2020-10-31 IMAGING — DX DG CHEST 1V PORT
1 series · 1 of 1 positions shown · non-contrast
Comparison: Yesterday

CLINICAL DATA: Follow-up pneumonia

EXAM:
PORTABLE CHEST 1 VIEW

[chest]
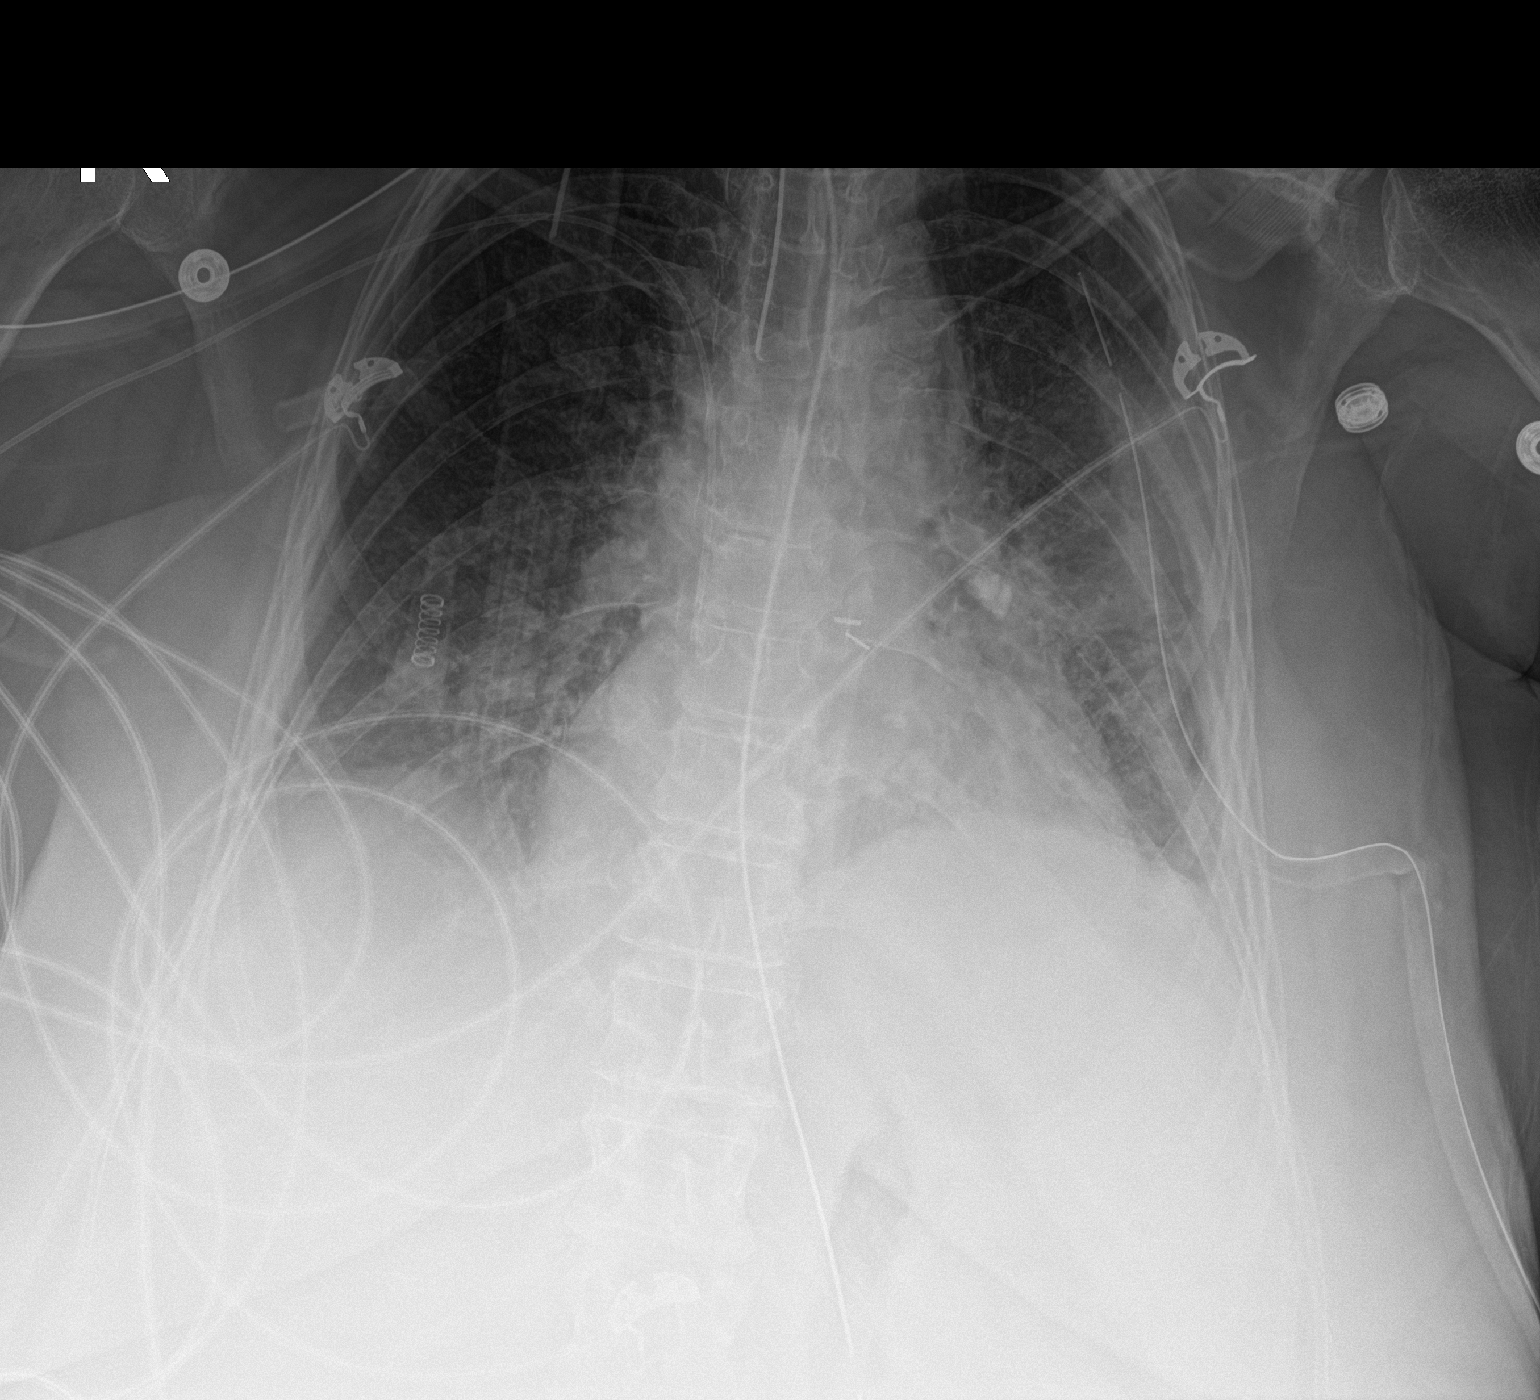

[1 of 1 positions shown; findings below may reference images not displayed]

FINDINGS: Endotracheal tube tip at the clavicular heads. The orogastric tube
at least reaches the stomach. Left chest tube in stable position.
Stable indistinct opacities at the bases and hazy density on the
left in the mid chest. Calcified left hilar lymph nodes. The left IJ
line has been removed. Possible trace left apical pneumothorax.
IMPRESSION: 1. History of pneumonia with stable bilateral lung opacity.
2. Probable trace left apical pneumothorax with chest tube in stable
position.

## 2020-10-31 IMAGING — DX DG CHEST 1V PORT
1 series · 1 of 1 positions shown · non-contrast
Comparison: Radiograph of same day.

CLINICAL DATA: Tracheostomy placement.

EXAM:
PORTABLE CHEST 1 VIEW

[chest ap]
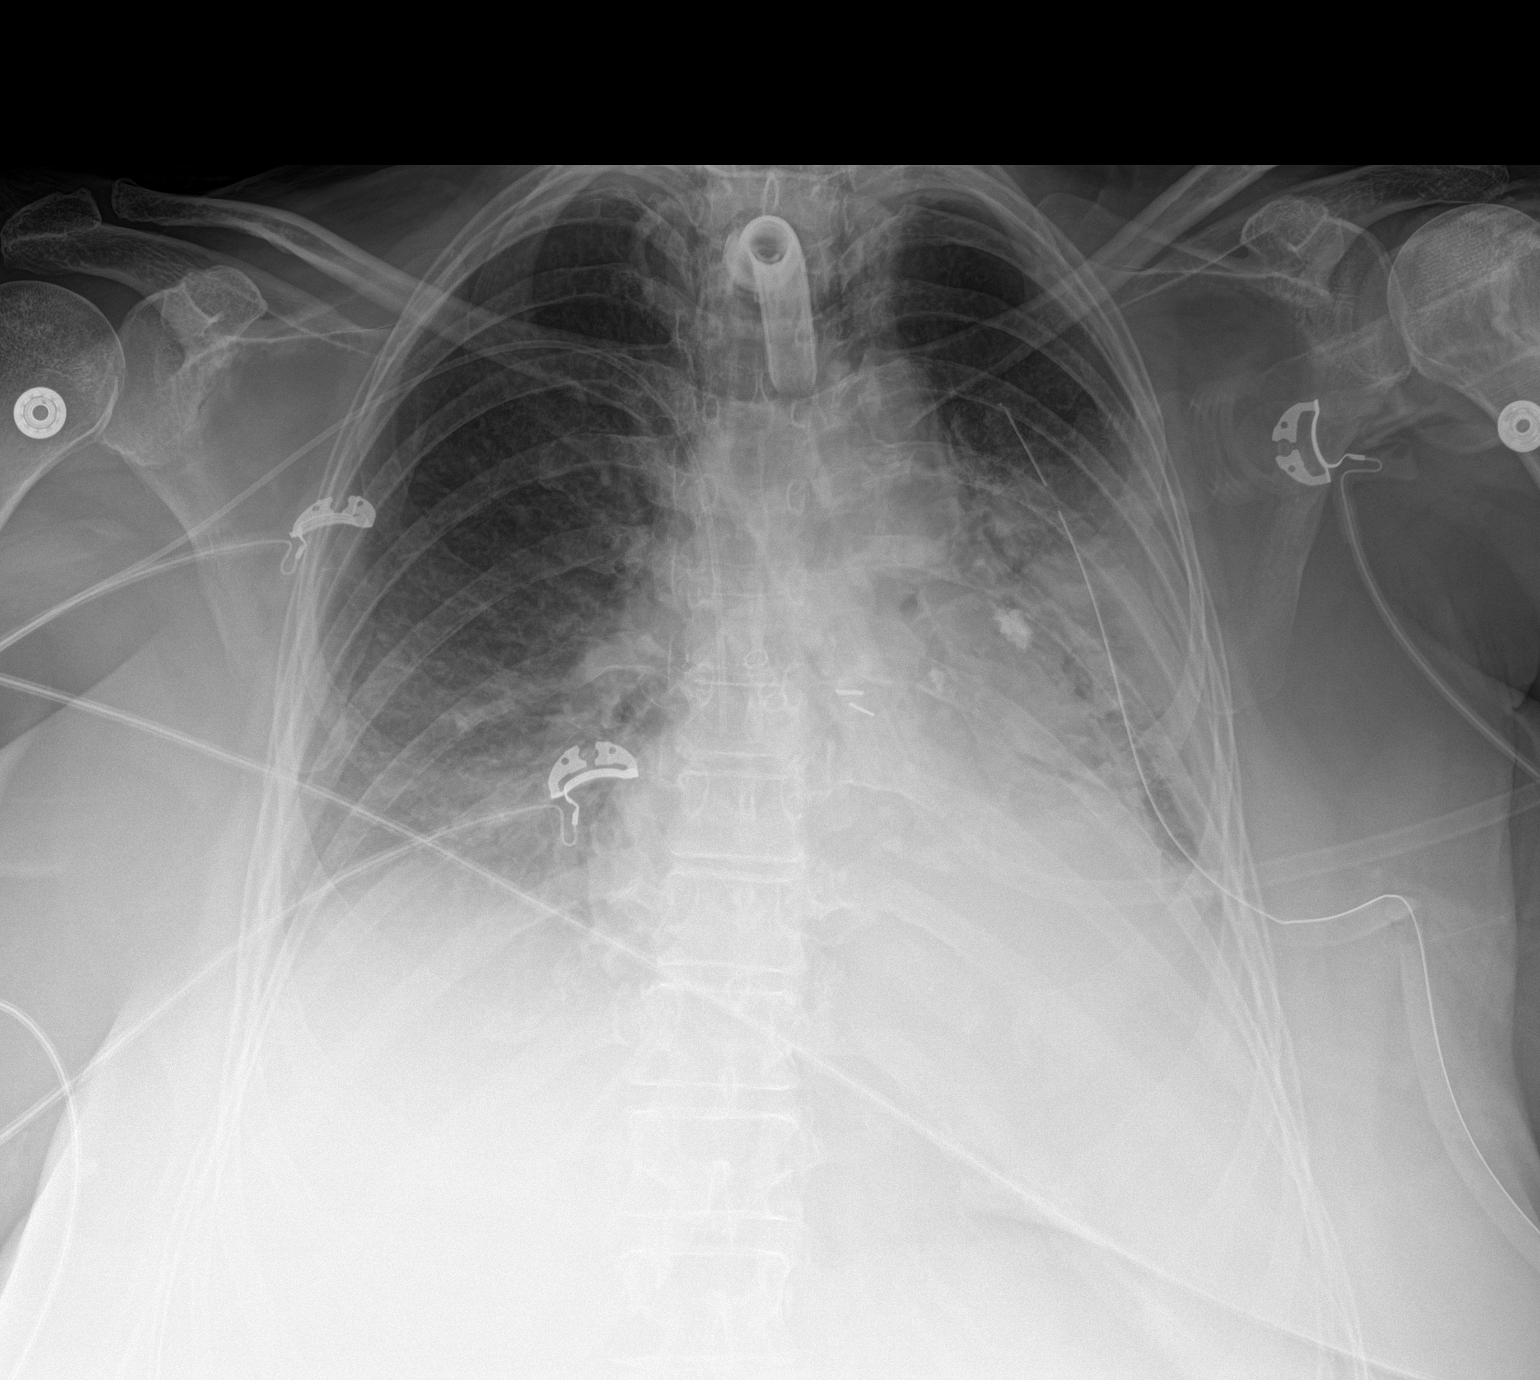

[1 of 1 positions shown; findings below may reference images not displayed]

FINDINGS: Stable cardiomegaly with central pulmonary vascular congestion.
Endotracheal and nasogastric tubes have been removed. Tracheostomy
tube appears to be in grossly good position. Right-sided PICC line
is unchanged in position. Left-sided chest tube is noted with mild
left apical pneumothorax. Increased left midlung and basilar opacity
is noted concerning for atelectasis or pneumonia. Stable right
basilar atelectasis is noted. Bony thorax is unremarkable.
IMPRESSION: Stable position of left-sided chest tube with mild left apical
pneumothorax. Tracheostomy tube is in grossly good position.
Increased left lung opacity is noted as described above.

## 2020-11-01 IMAGING — CT CT HEAD W/O CM
4 series · 17 of 47 positions shown, 19 images · non-contrast
Comparison: None.

CLINICAL DATA: Encephalopathy.

EXAM:
CT HEAD WITHOUT CONTRAST
TECHNIQUE: Contiguous axial images were obtained from the base of the skull
through the vertex without intravenous contrast.

[Series 4: head bone · axial · 0.44mm/px · z∈[+1311,+1367]mm · 4 of 83 slices shown]
[im 9/83  bone]
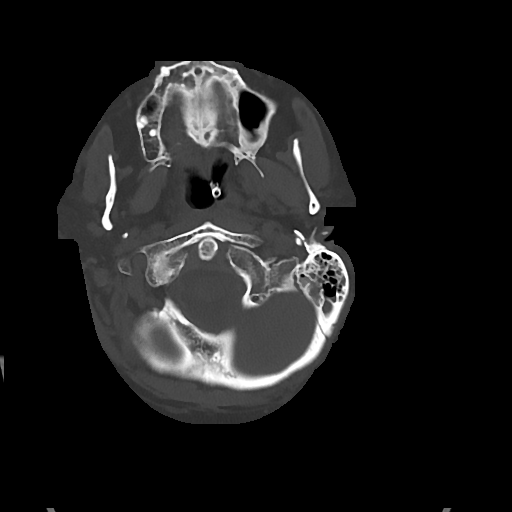
[im 17/83  bone]
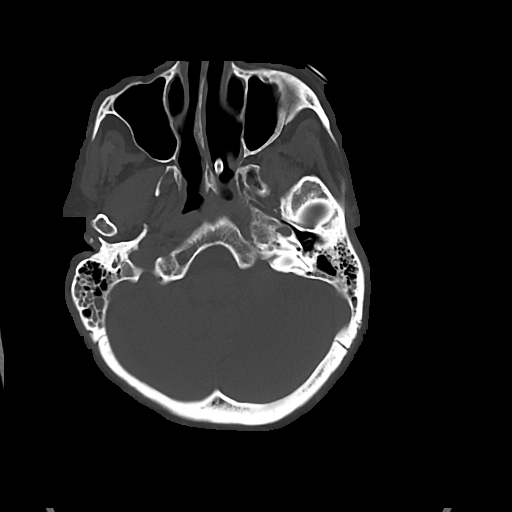
[im 25/83  bone]
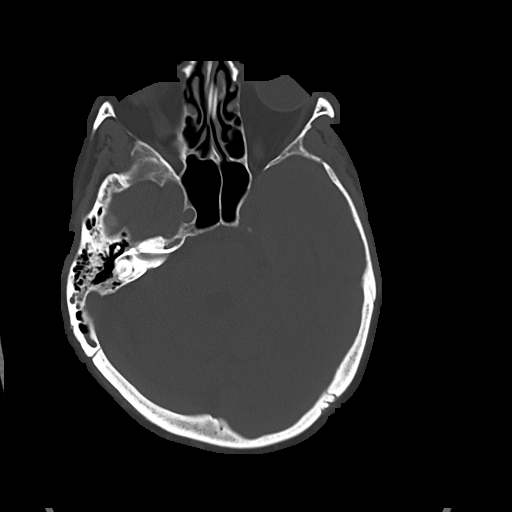
[im 37/83  bone]
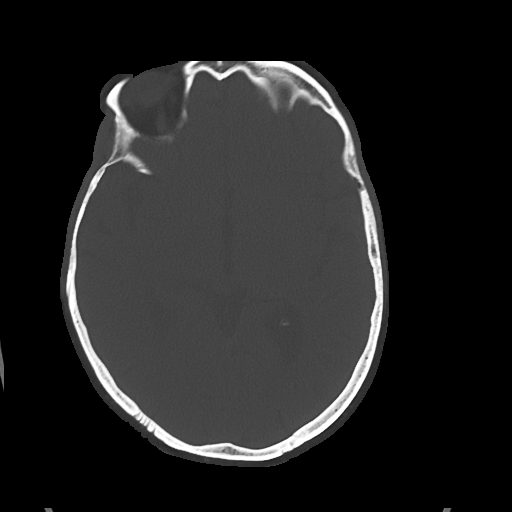

[Series 5: head wo · axial · 0.44mm/px · z∈[+1315,+1435]mm · 7 of 34 slices shown, 9 images]
[im 5/34  brain]
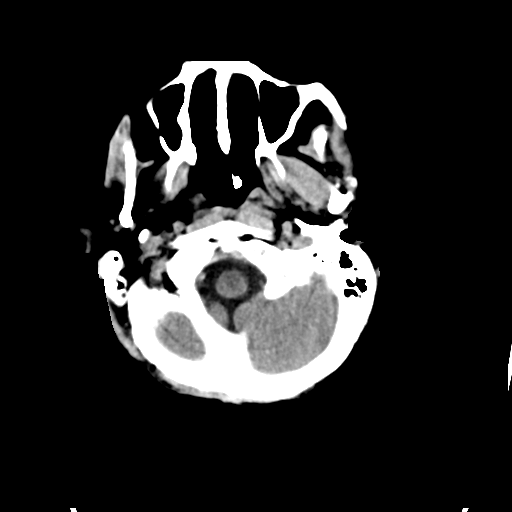
[im 5/34  bone]
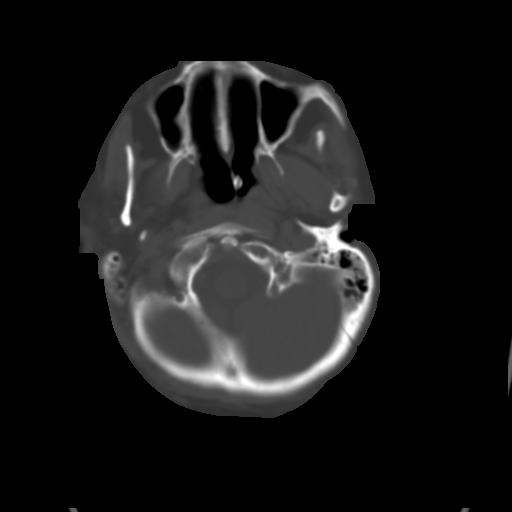
[im 9/34  brain]
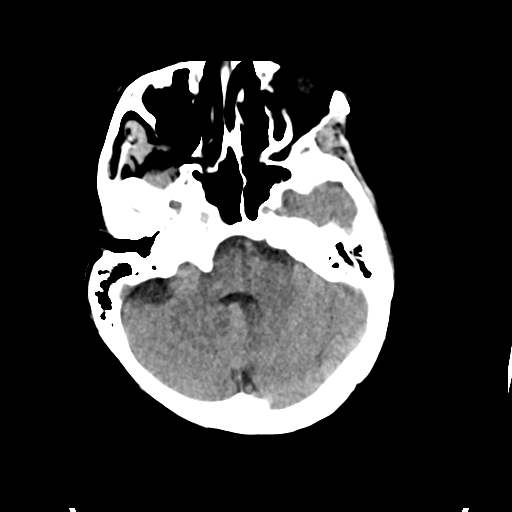
[im 13/34  brain]
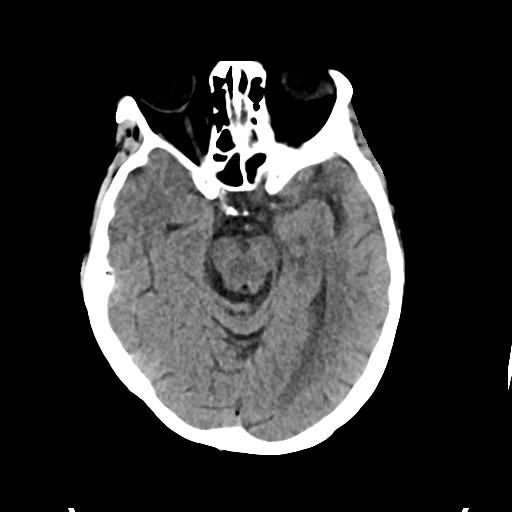
[im 17/34  brain]
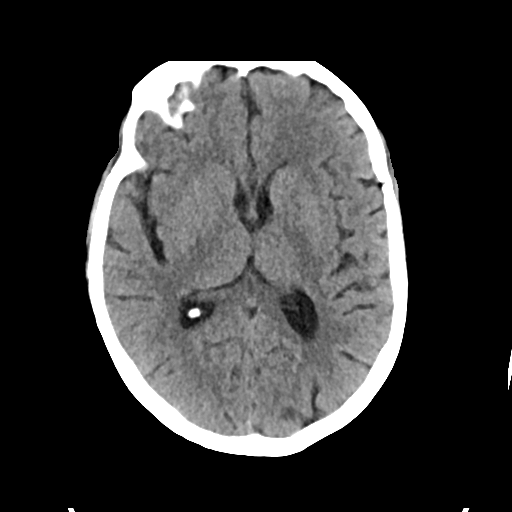
[im 21/34  brain]
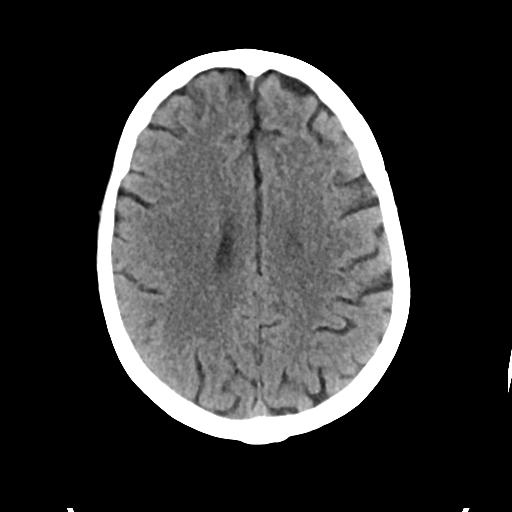
[im 21/34  bone]
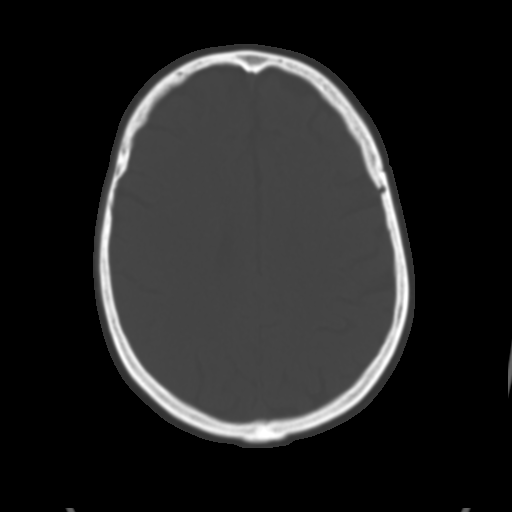
[im 25/34  brain]
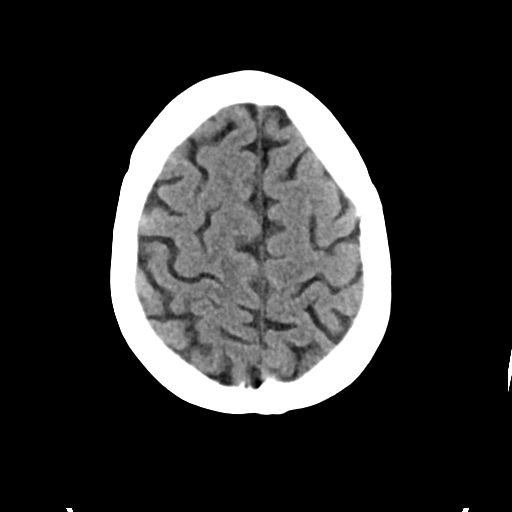
[im 29/34  brain]
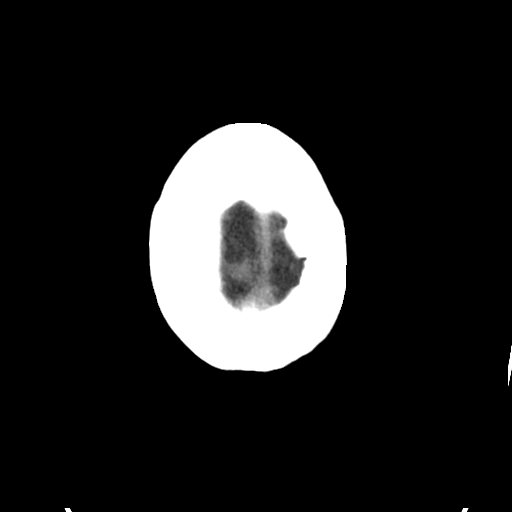

[Series 6: cor soft · coronal · 0.32mm/px · 3 of 69 slices shown]
[im 23/69  brain]
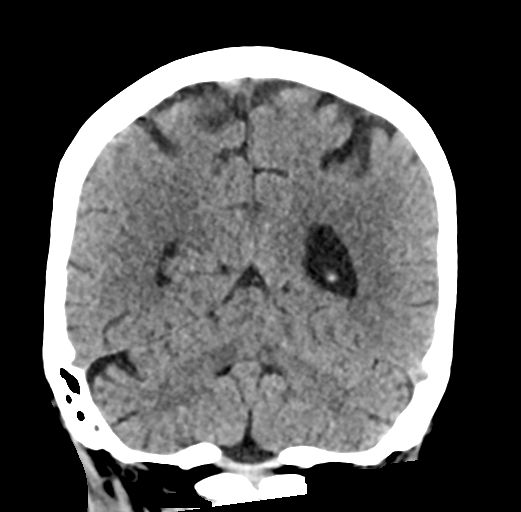
[im 31/69  brain]
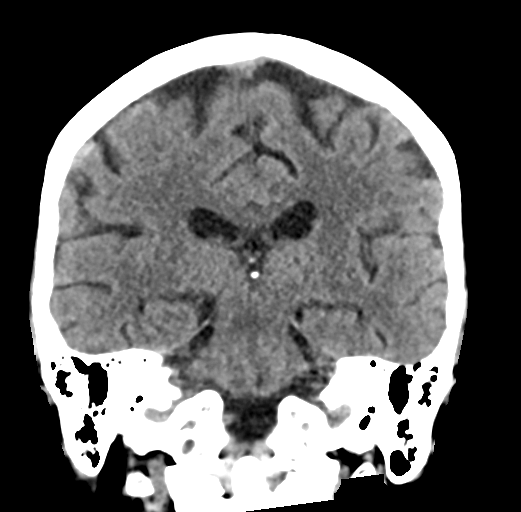
[im 38/69  brain]
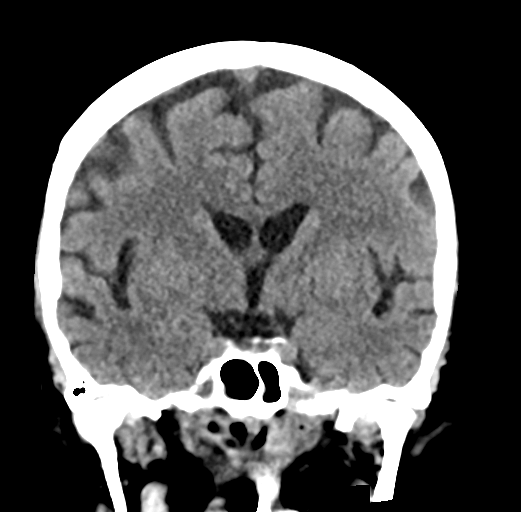

[Series 7: sag soft · sagittal · 0.32mm/px · 3 of 54 slices shown]
[im 19/54  brain]
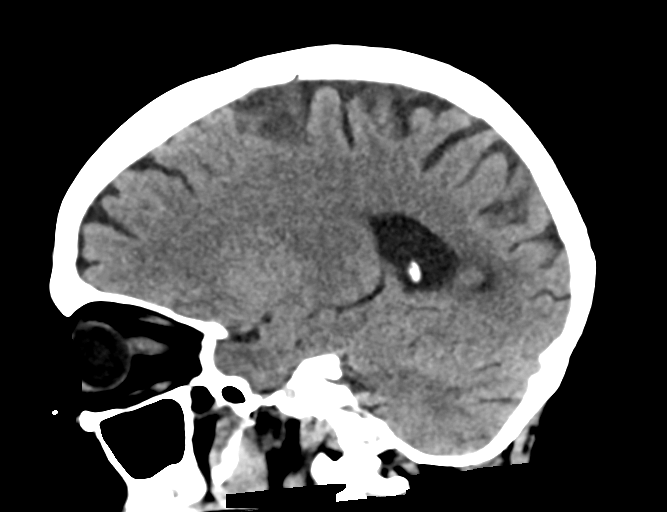
[im 27/54  brain]
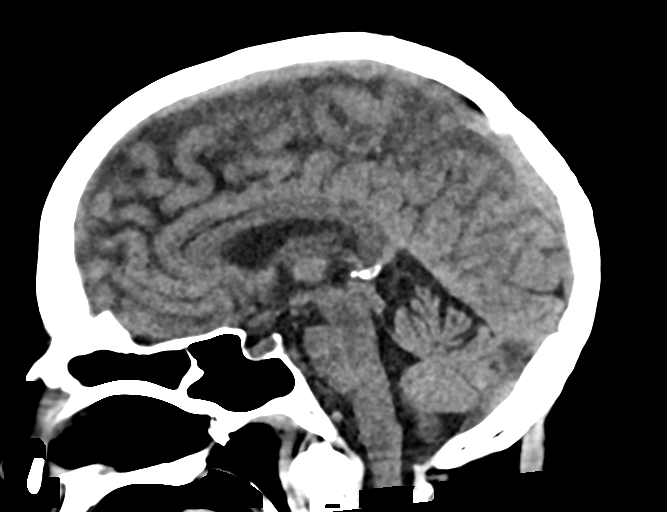
[im 36/54  brain]
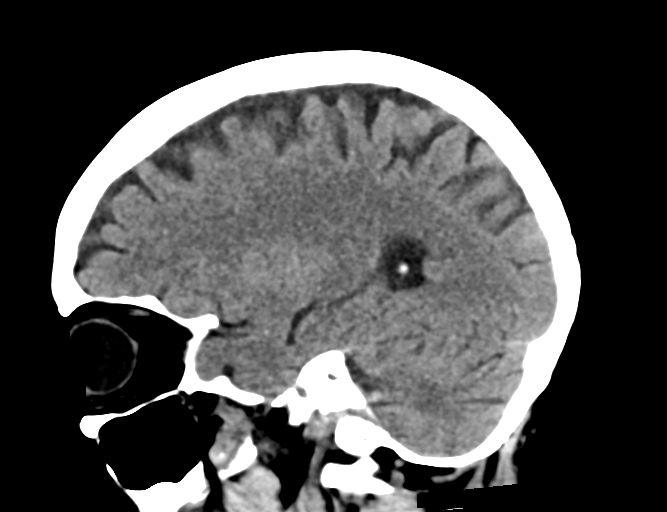

[17 of 47 positions shown; findings below may reference images not displayed]

FINDINGS: Brain: There is no evidence of acute infarct, intracranial
hemorrhage, mass, midline shift, or extra-axial fluid collection.
The ventricles and sulci are within normal limits for age.

Vascular: No hyperdense vessel.

Skull: No fracture or focal osseous lesion.

Sinuses/Orbits: Mild sphenoid sinus mucosal thickening. Small
bilateral mastoid effusions. Unremarkable orbits.

Other: Partially visualized nasoenteric tube.
IMPRESSION: Unremarkable CT appearance of the brain.

## 2020-11-01 IMAGING — DX DG CHEST 1V PORT
1 series · 1 of 1 positions shown · non-contrast
Comparison: 07/23/2018 and earlier, including CT chest 07/19/2018.

CLINICAL DATA: Chronic ventilator dependent respiratory failure.
Follow-up BILATERAL pneumonia.

EXAM:
PORTABLE CHEST 1 VIEW

[chest ap]
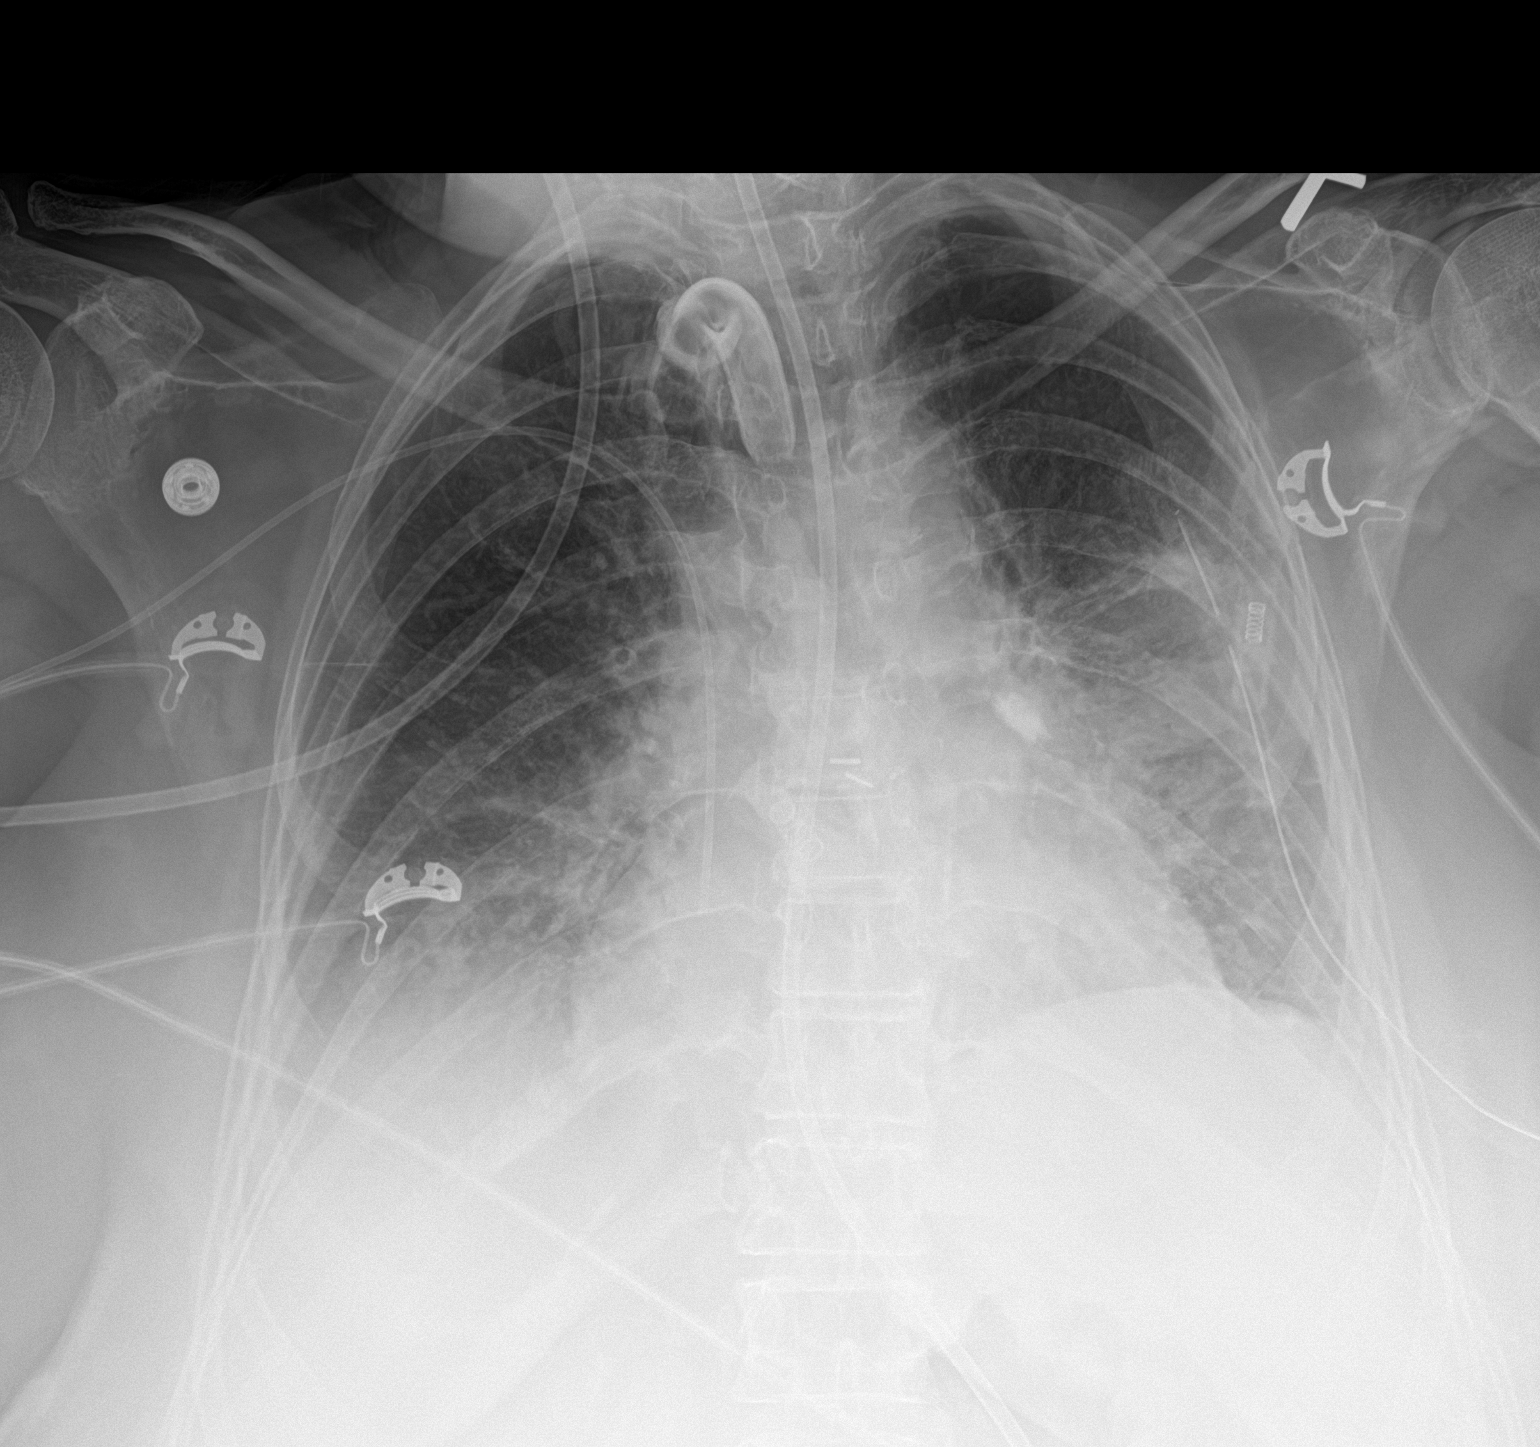

[1 of 1 positions shown; findings below may reference images not displayed]

FINDINGS: Tracheostomy tube tip in satisfactory position projecting below the
thoracic inlet approximately 4 cm above the carina. RIGHT arm PICC
tip projects at or near the cavoatrial junction. Feeding tube
courses below the diaphragm into the stomach though its tip is not
included on the image. LEFT chest tube remains in place with no
pneumothorax and no significant residual LEFT pleural effusion.
Significant improvement in aeration in the LEFT lung since
yesterday, with residual consolidation and air bronchograms in the
INFERIOR LEFT UPPER LOBE. Stable small RIGHT pleural effusion and
associated consolidation in the RIGHT LOWER LOBE. Cardiac silhouette
mildly to moderately enlarged, unchanged. Mild pulmonary venous
hypertension without overt edema.
IMPRESSION: 1.  Support apparatus satisfactory.
2. Improved aeration in the LEFT lung since yesterday, with
resolution of LEFT LOWER LOBE atelectasis and/or pneumonia and
improved LEFT UPPER LOBE pneumonia. Moderate residual airspace
pneumonia with air bronchograms in the LEFT UPPER LOBE.
3. Stable small RIGHT pleural effusion and associated passive
atelectasis and/or pneumonia involving the RIGHT LOWER LOBE.
4. No new abnormalities.

## 2020-11-03 IMAGING — DX DG CHEST 1V PORT
1 series · 1 of 1 positions shown · non-contrast
Comparison: 07/24/2018

CLINICAL DATA: Tracheostomy.  Left chest tube.

EXAM:
PORTABLE CHEST 1 VIEW

[chest]
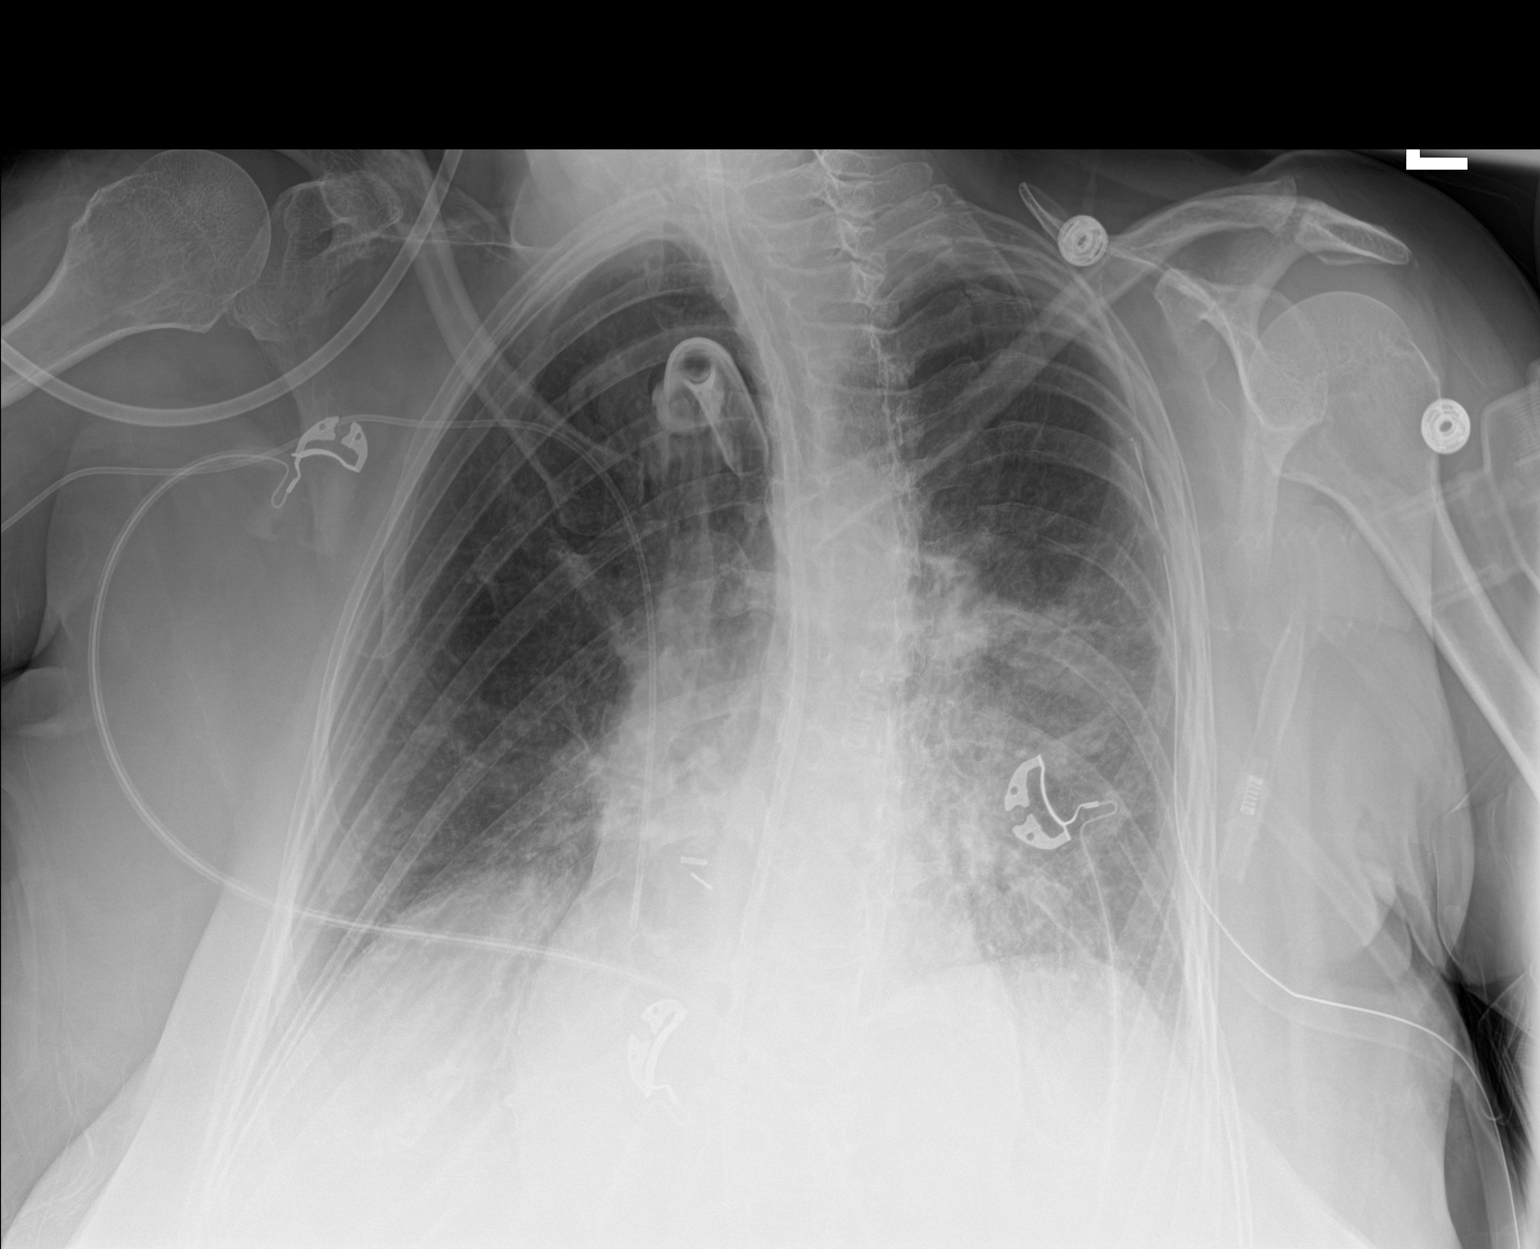

[1 of 1 positions shown; findings below may reference images not displayed]

FINDINGS: Tracheostomy, soft feeding tube, left chest tube and right arm PICC
remain in place, unchanged. No left pleural air is visible. Slight
improvement in aeration of both lower lobes.
IMPRESSION: Lines and tubes unchanged in satisfactory. No visible left
pneumothorax. Slightly improved lower lobe aeration.

## 2020-11-04 IMAGING — DX DG CHEST 1V PORT
1 series · 1 of 1 positions shown · non-contrast
Comparison: 07/27/2018

CLINICAL DATA: Tiny apical pneumothorax

EXAM:
PORTABLE CHEST 1 VIEW

[chest ap]
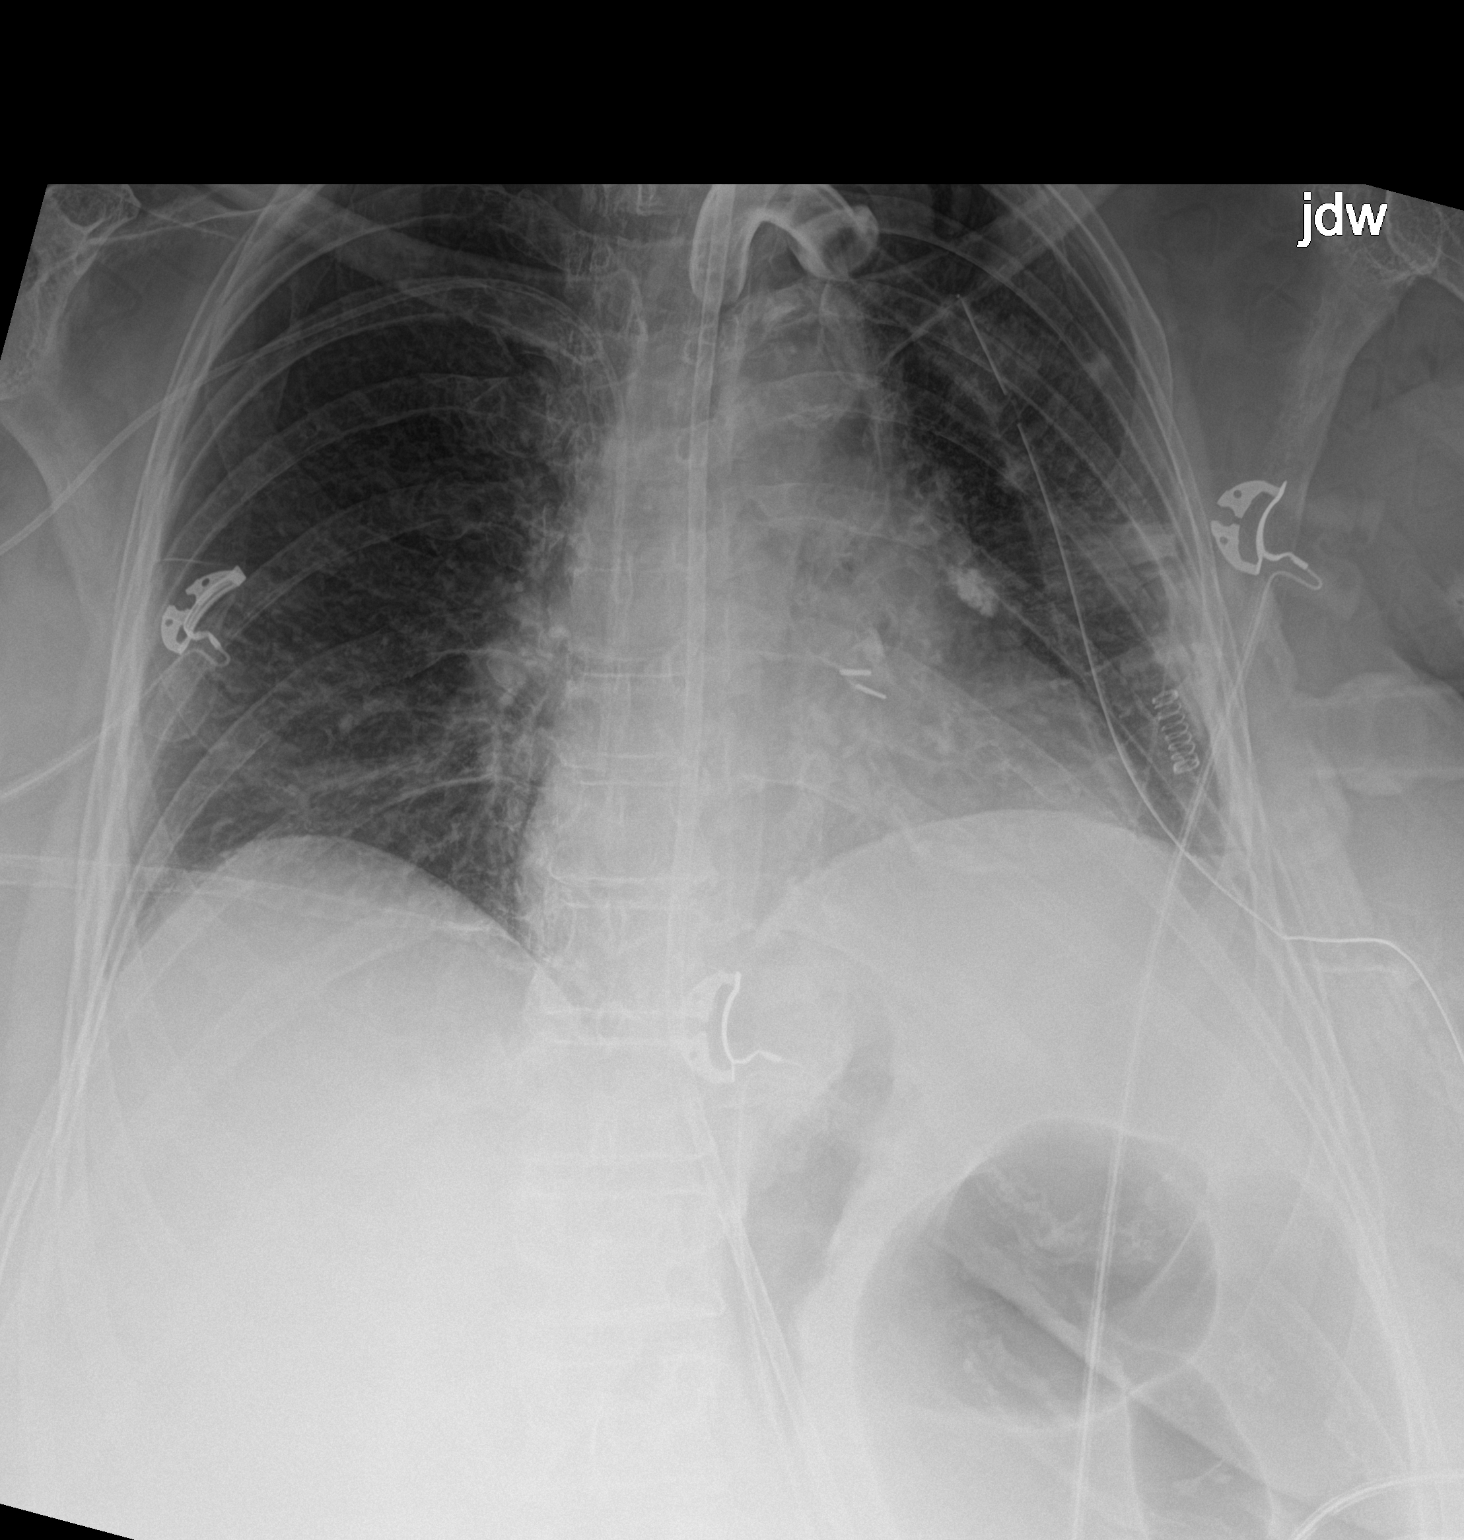

[1 of 1 positions shown; findings below may reference images not displayed]

FINDINGS: Smaller left apical pneumothorax now projecting up to the posterior
second rib level, previously at the third rib level. Reposition
left-sided chest tube is noted with tip overlying the posterior
fifth rib. No mediastinal shift. Tracheostomy tube is in place. A
weighted feeding tube projects below the left hemidiaphragm. The tip
is excluded on this study. Chronic mild interstitial prominence is
noted slightly decreased in appearance. Right-sided PICC line tip
terminates at the cavoatrial juncture. Tracheostomy tube tip
projects over the trachea at the level of the aortic arch.
IMPRESSION: Smaller left apical pneumothorax. Left-sided chest tube in place.
Satisfactory support line and tube positions to the extent
visualized.

## 2020-11-04 IMAGING — DX DG CHEST 1V PORT
1 series · 1 of 1 positions shown · non-contrast
Comparison: 07/26/2018.  07/24/2017.

CLINICAL DATA: Respiratory failure.

EXAM:
PORTABLE CHEST 1 VIEW

[chest]
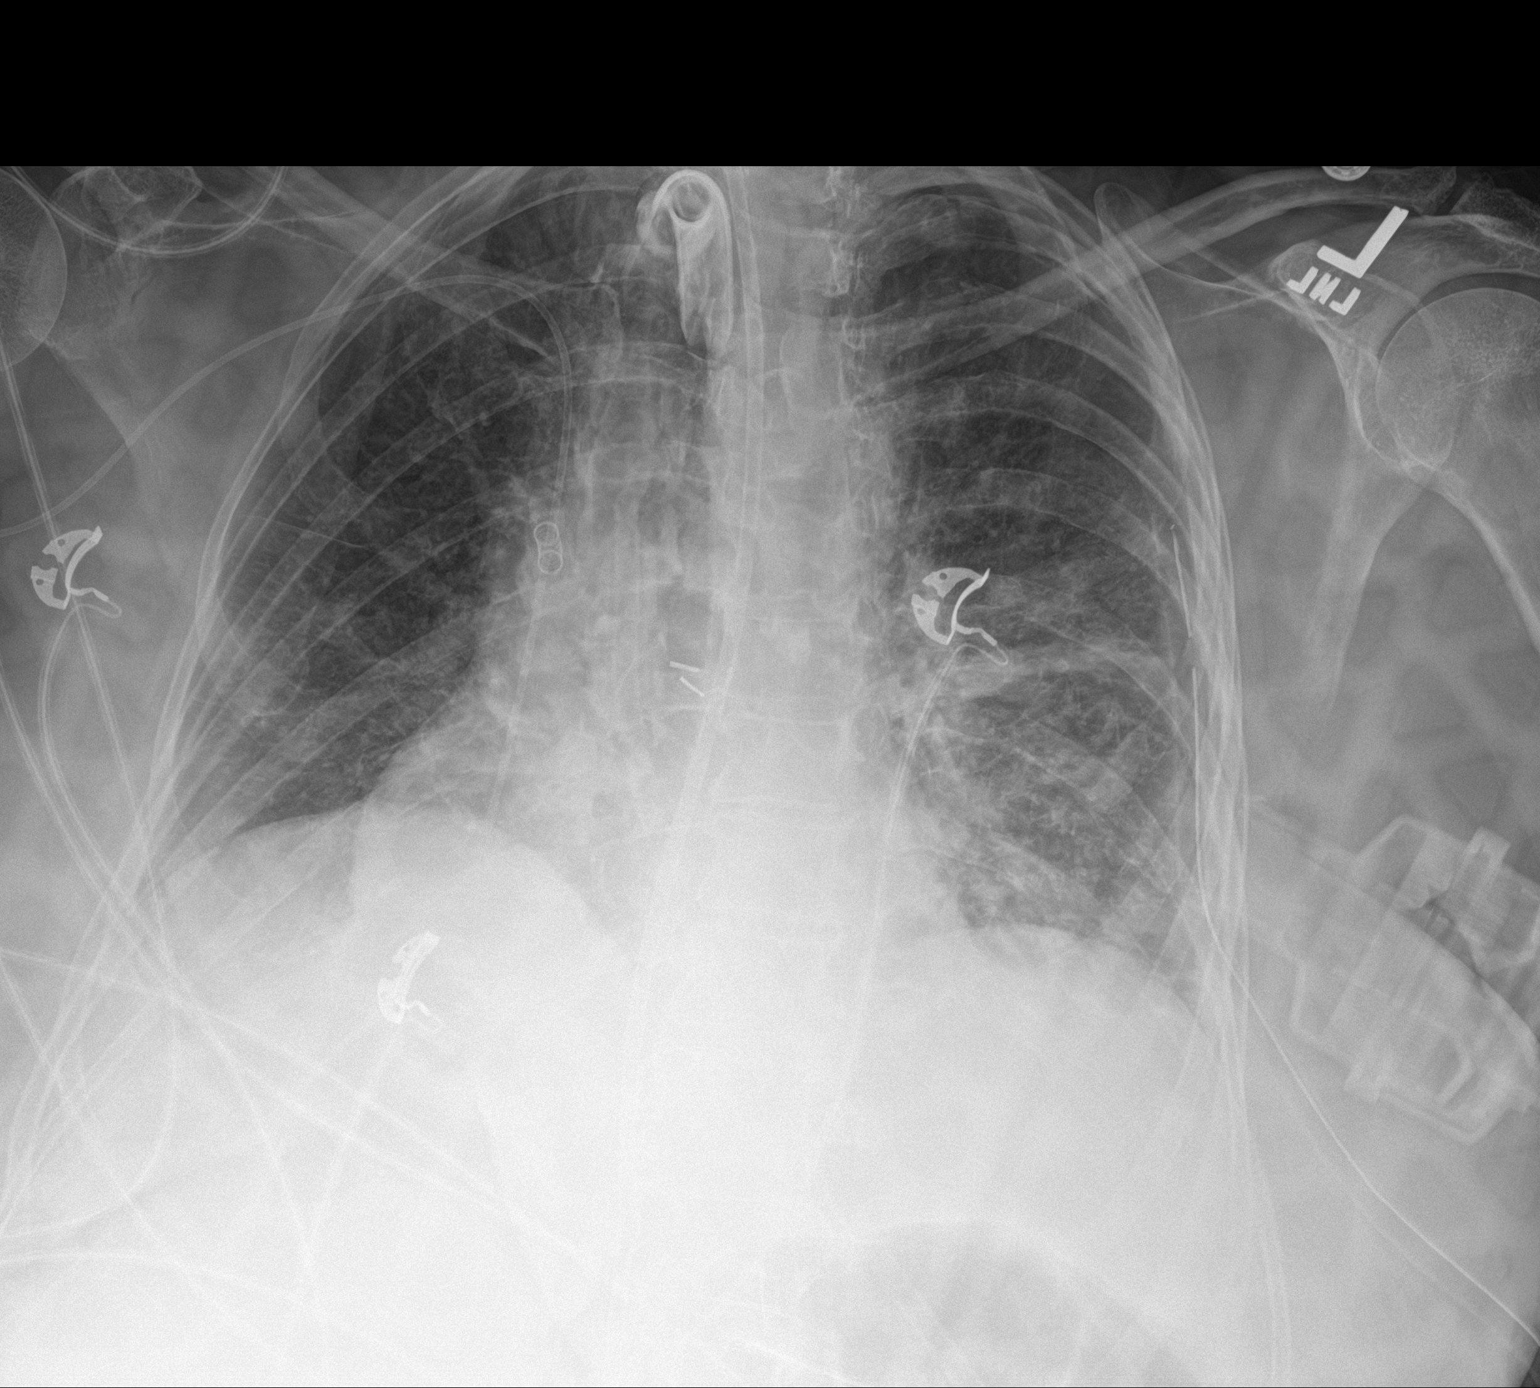

[1 of 1 positions shown; findings below may reference images not displayed]

FINDINGS: Tracheostomy tube, feeding tube, right PICC line in stable position.
Left chest tube in stable position. Heart size stable. Bibasilar
atelectasis/infiltrates again noted. No pleural effusion. Tiny left
apical pneumothorax noted on today's exam.
IMPRESSION: 1. Left chest tube in stable position. Tiny left apical pneumothorax
noted on today's exam.

2.  Remaining lines and tubes stable position.

3.  Bibasilar atelectasis/infiltrates again noted.

Critical Value/emergent results were called by telephone at the time
of interpretation on 07/27/2018 at [DATE] to nurse Garsva, who
verbally acknowledged these results.

## 2020-11-05 IMAGING — DX DG CHEST 1V PORT
1 series · 1 of 1 positions shown · non-contrast
Comparison: Yesterday

CLINICAL DATA: Pneumothorax follow-up

EXAM:
PORTABLE CHEST 1 VIEW

[chest ap]
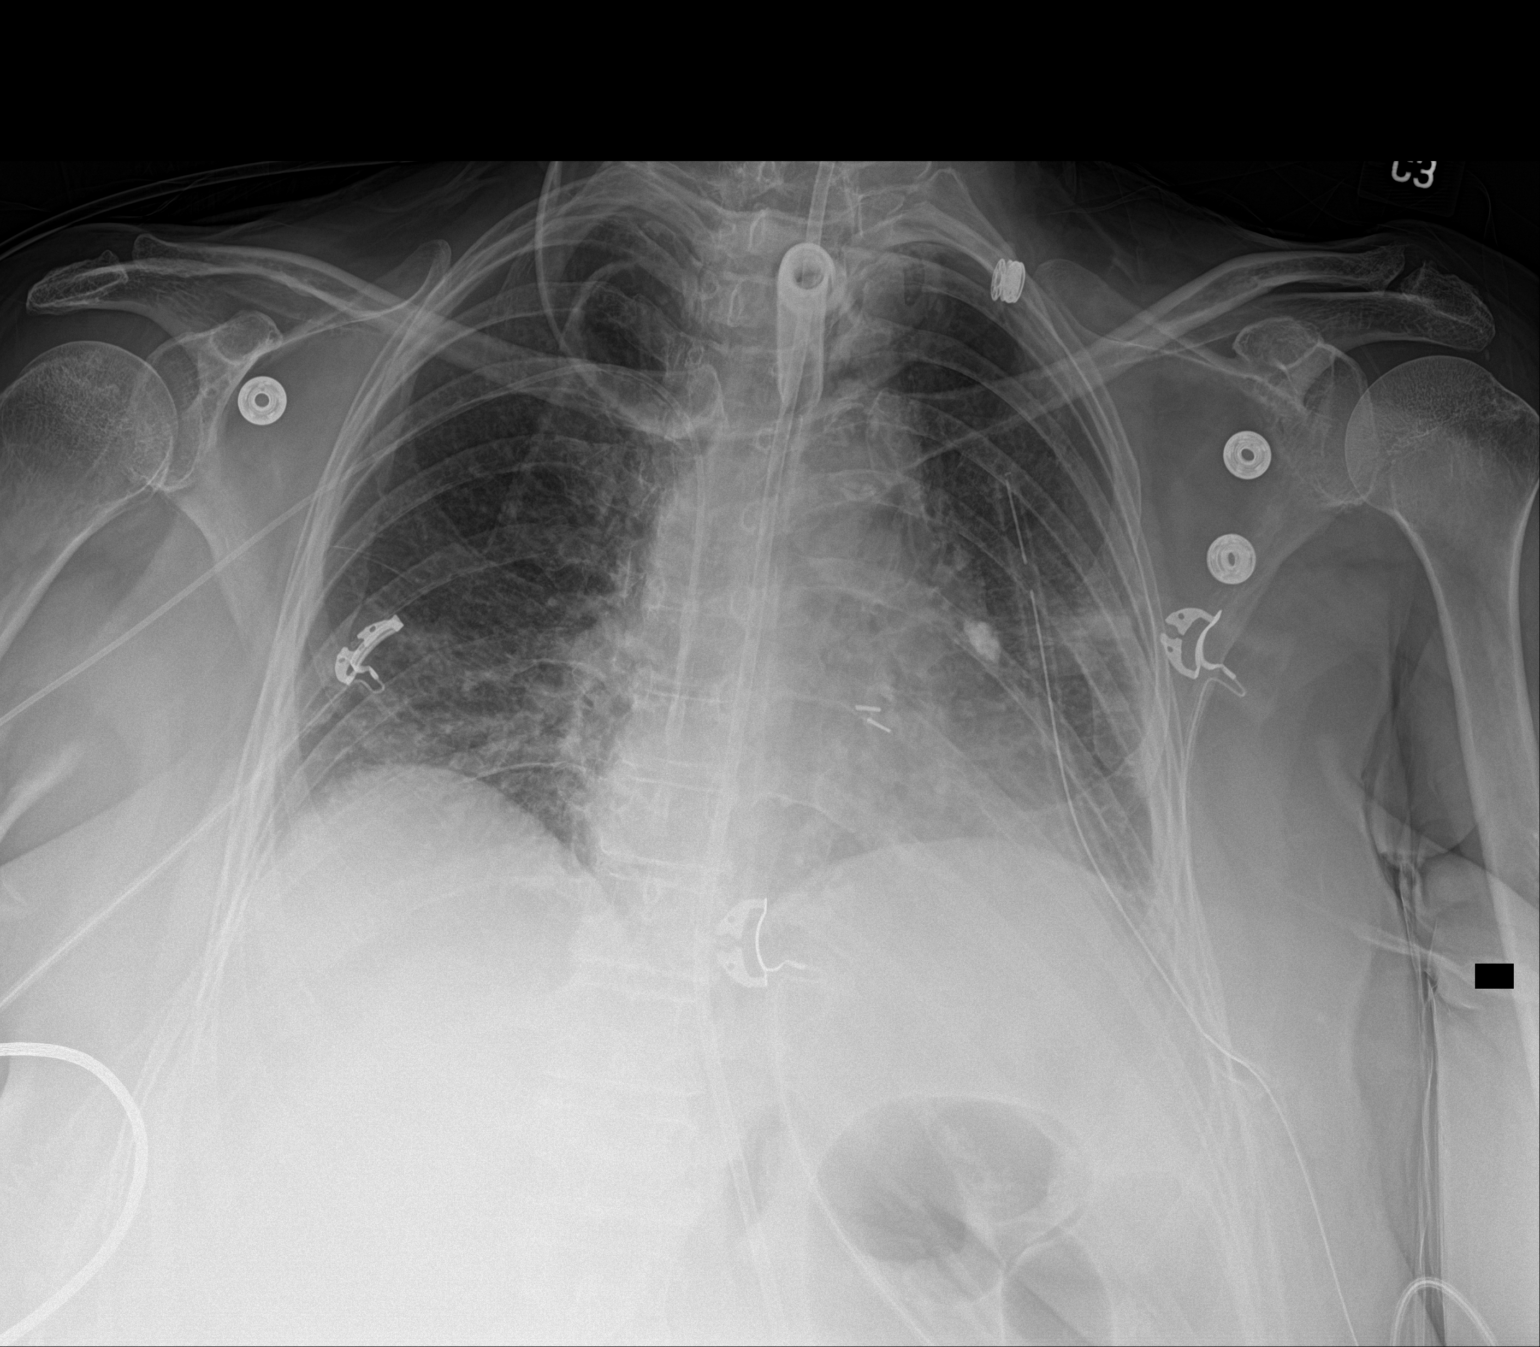

[1 of 1 positions shown; findings below may reference images not displayed]

FINDINGS: Tracheostomy tube that remains well seated. Right upper extremity
PICC and left chest tube in stable unremarkable position. The
feeding tube at least reaches the stomach. Stable trace left apical
pneumothorax. Stable low volumes and interstitial crowding. Stable
heart size.
IMPRESSION: Stable hardware positioning and trace left apical pneumothorax.
